# Patient Record
Sex: Male | Born: 1965 | Race: Black or African American | Hispanic: No | Smoking: Current every day smoker
Health system: Southern US, Community
[De-identification: ages and names within clinical notes are randomized; demographics above are authoritative.]

## PROBLEM LIST (undated history)

## (undated) DIAGNOSIS — R519 Headache, unspecified: Secondary | ICD-10-CM

## (undated) DIAGNOSIS — F419 Anxiety disorder, unspecified: Secondary | ICD-10-CM

## (undated) DIAGNOSIS — F1410 Cocaine abuse, uncomplicated: Secondary | ICD-10-CM

## (undated) DIAGNOSIS — F329 Major depressive disorder, single episode, unspecified: Secondary | ICD-10-CM

## (undated) DIAGNOSIS — Z973 Presence of spectacles and contact lenses: Secondary | ICD-10-CM

## (undated) DIAGNOSIS — G8929 Other chronic pain: Secondary | ICD-10-CM

## (undated) DIAGNOSIS — F1011 Alcohol abuse, in remission: Secondary | ICD-10-CM

## (undated) DIAGNOSIS — M1990 Unspecified osteoarthritis, unspecified site: Secondary | ICD-10-CM

## (undated) DIAGNOSIS — J45909 Unspecified asthma, uncomplicated: Secondary | ICD-10-CM

## (undated) DIAGNOSIS — F32A Depression, unspecified: Secondary | ICD-10-CM

## (undated) DIAGNOSIS — R45850 Homicidal ideations: Secondary | ICD-10-CM

## (undated) DIAGNOSIS — K7460 Unspecified cirrhosis of liver: Secondary | ICD-10-CM

## (undated) DIAGNOSIS — F259 Schizoaffective disorder, unspecified: Secondary | ICD-10-CM

## (undated) DIAGNOSIS — Z9989 Dependence on other enabling machines and devices: Secondary | ICD-10-CM

## (undated) DIAGNOSIS — R45851 Suicidal ideations: Secondary | ICD-10-CM

## (undated) DIAGNOSIS — F1994 Other psychoactive substance use, unspecified with psychoactive substance-induced mood disorder: Secondary | ICD-10-CM

## (undated) DIAGNOSIS — I10 Essential (primary) hypertension: Secondary | ICD-10-CM

## (undated) DIAGNOSIS — F319 Bipolar disorder, unspecified: Secondary | ICD-10-CM

## (undated) HISTORY — PX: COLONOSCOPY: SHX174

## (undated) HISTORY — PX: APPENDECTOMY: SHX54

---

## 1999-08-03 ENCOUNTER — Emergency Department (HOSPITAL_COMMUNITY): Admission: EM | Admit: 1999-08-03 | Discharge: 1999-08-03 | Payer: Self-pay | Admitting: Emergency Medicine

## 1999-09-25 ENCOUNTER — Emergency Department (HOSPITAL_COMMUNITY): Admission: EM | Admit: 1999-09-25 | Discharge: 1999-09-25 | Payer: Self-pay | Admitting: *Deleted

## 1999-11-04 ENCOUNTER — Emergency Department (HOSPITAL_COMMUNITY): Admission: EM | Admit: 1999-11-04 | Discharge: 1999-11-04 | Payer: Self-pay | Admitting: Emergency Medicine

## 2000-02-21 ENCOUNTER — Emergency Department (HOSPITAL_COMMUNITY): Admission: EM | Admit: 2000-02-21 | Discharge: 2000-02-21 | Payer: Self-pay | Admitting: Emergency Medicine

## 2000-06-16 ENCOUNTER — Emergency Department (HOSPITAL_COMMUNITY): Admission: EM | Admit: 2000-06-16 | Discharge: 2000-06-16 | Payer: Self-pay | Admitting: Emergency Medicine

## 2000-08-04 ENCOUNTER — Emergency Department (HOSPITAL_COMMUNITY): Admission: EM | Admit: 2000-08-04 | Discharge: 2000-08-04 | Payer: Self-pay

## 2003-12-11 ENCOUNTER — Inpatient Hospital Stay (HOSPITAL_COMMUNITY): Admission: EM | Admit: 2003-12-11 | Discharge: 2003-12-13 | Payer: Self-pay | Admitting: Emergency Medicine

## 2014-01-14 ENCOUNTER — Emergency Department (HOSPITAL_COMMUNITY): Payer: Self-pay

## 2014-01-14 ENCOUNTER — Encounter (HOSPITAL_COMMUNITY): Payer: Self-pay | Admitting: Emergency Medicine

## 2014-01-14 ENCOUNTER — Emergency Department (HOSPITAL_COMMUNITY)
Admission: EM | Admit: 2014-01-14 | Discharge: 2014-01-14 | Disposition: A | Discharge status: Home / Self Care | Payer: Self-pay | Attending: Emergency Medicine | Admitting: Emergency Medicine

## 2014-01-14 DIAGNOSIS — I10 Essential (primary) hypertension: Secondary | ICD-10-CM | POA: Insufficient documentation

## 2014-01-14 DIAGNOSIS — R519 Headache, unspecified: Secondary | ICD-10-CM

## 2014-01-14 DIAGNOSIS — Z8719 Personal history of other diseases of the digestive system: Secondary | ICD-10-CM | POA: Insufficient documentation

## 2014-01-14 DIAGNOSIS — R079 Chest pain, unspecified: Secondary | ICD-10-CM | POA: Insufficient documentation

## 2014-01-14 DIAGNOSIS — R51 Headache: Secondary | ICD-10-CM | POA: Insufficient documentation

## 2014-01-14 HISTORY — DX: Unspecified cirrhosis of liver: K74.60

## 2014-01-14 HISTORY — DX: Essential (primary) hypertension: I10

## 2014-01-14 LAB — COMPREHENSIVE METABOLIC PANEL
ALT: 26 U/L (ref 0–53)
AST: 22 U/L (ref 0–37)
Albumin: 3.7 g/dL (ref 3.5–5.2)
Alkaline Phosphatase: 60 U/L (ref 39–117)
BUN: 12 mg/dL (ref 6–23)
CHLORIDE: 105 meq/L (ref 96–112)
CO2: 24 meq/L (ref 19–32)
CREATININE: 1.2 mg/dL (ref 0.50–1.35)
Calcium: 8.5 mg/dL (ref 8.4–10.5)
GFR calc Af Amer: 80 mL/min — ABNORMAL LOW (ref >90)
GFR calc non Af Amer: 69 mL/min — ABNORMAL LOW (ref >90)
Glucose, Bld: 80 mg/dL (ref 70–99)
POTASSIUM: 3.8 meq/L (ref 3.7–5.3)
SODIUM: 144 meq/L (ref 137–147)
TOTAL PROTEIN: 6.6 g/dL (ref 6.0–8.3)
Total Bilirubin: 0.7 mg/dL (ref 0.3–1.2)

## 2014-01-14 LAB — CBC
HEMATOCRIT: 44.2 % (ref 39.0–52.0)
HEMOGLOBIN: 15.8 g/dL (ref 13.0–17.0)
MCH: 31.8 pg (ref 26.0–34.0)
MCHC: 35.7 g/dL (ref 30.0–36.0)
MCV: 88.9 fL (ref 78.0–100.0)
Platelets: 220 10*3/uL (ref 150–400)
RBC: 4.97 MIL/uL (ref 4.22–5.81)
RDW: 14.1 % (ref 11.5–15.5)
WBC: 7.2 10*3/uL (ref 4.0–10.5)

## 2014-01-14 LAB — PROTIME-INR
INR: 1 (ref 0.00–1.49)
Prothrombin Time: 13 seconds (ref 11.6–15.2)

## 2014-01-14 LAB — I-STAT TROPONIN, ED
TROPONIN I, POC: 0 ng/mL (ref 0.00–0.08)
TROPONIN I, POC: 0 ng/mL (ref 0.00–0.08)

## 2014-01-14 LAB — APTT: aPTT: 35 seconds (ref 24–37)

## 2014-01-14 MED ORDER — ASPIRIN 81 MG PO CHEW: 324.0000 mg | CHEWABLE_TABLET | Freq: Once | ORAL | Status: DC

## 2014-01-14 MED ORDER — MORPHINE SULFATE 4 MG/ML IJ SOLN
4.0000 mg | Freq: Once | INTRAMUSCULAR | Status: AC
  Administered 2014-01-14: 4 mg via INTRAVENOUS
  Filled 2014-01-14: qty 1

## 2014-01-14 MED ORDER — SODIUM CHLORIDE 0.9 % IV SOLN
1000.0000 mL | INTRAVENOUS | Status: DC
  Administered 2014-01-14: 1000 mL via INTRAVENOUS

## 2014-01-14 MED ORDER — NITROGLYCERIN 0.4 MG SL SUBL: 0.4000 mg | SUBLINGUAL_TABLET | SUBLINGUAL | Status: DC | PRN

## 2014-01-14 MED ORDER — NITROGLYCERIN 2 % TD OINT
1.0000 [in_us] | TOPICAL_OINTMENT | Freq: Once | TRANSDERMAL | Status: AC
  Administered 2014-01-14: 1 [in_us] via TOPICAL
  Filled 2014-01-14: qty 1

## 2014-01-14 NOTE — ED Notes (Signed)
Pt discharged home with all belongings, pt alert, oriented, and ambulatory upon discharge, no new RX pt verbalizes understanding of discharge instructions, cab voucher given

## 2014-01-14 NOTE — Discharge Instructions (Signed)
Aspirin and Your Heart Aspirin affects the way your blood clots and helps "thin" the blood. Aspirin has many uses in heart disease. It may be used as a primary prevention to help reduce the risk of heart related events. It also can be used as a secondary measure to prevent more heart attacks or to prevent additional damage from blood clots.  ASPIRIN MAY HELP IF YOU:  Have had a heart attack or chest pain.  Have undergone open heart surgery such as CABG (Coronary Artery Bypass Surgery).  Have had coronary angioplasty with or without stents.  Have experienced a stroke or TIA (transient ischemic attack).  Have peripheral vascular disease (PAD).  Have chronic heart rhythm problems such as atrial fibrillation.  Are at risk for heart disease. BEFORE STARTING ASPIRIN Before you start taking aspirin, your caregiver will need to review your medical history. Many things will need to be taken into consideration, such as:  Smoking status.  Blood pressure.  Diabetes.  Gender.  Weight.  Cholesterol level. ASPIRIN DOSES  Aspirin should only be taken on the advice of your caregiver. Talk to your caregiver about how much aspirin you should take. Aspirin comes in different doses such as:  81 mg.  162 mg.  325 mg.  The aspirin dose you take may be affected by many factors, some of which include:  Your current medications, especially if your are taking blood-thinners or anti-platelet medicine.  Liver function.  Heart disease risk.  Age.  Aspirin comes in two forms:  Non-enteric-coated. This type of aspirin does not have a coating and is absorbed faster. Non-enteric coated aspirin is recommended for patients experiencing chest pain symptoms. This type of aspirin also comes in a chewable form.  Enteric-coated. This means the aspirin has a special coating that releases the medicine very slowly. Enteric-coated aspirin causes less stomach upset. This type of aspirin should not be chewed  or crushed. ASPIRIN SIDE EFFECTS Daily use of aspirin can increase your risk of serious side effects. Some of these include:  Increased bleeding. This can range from a cut that does not stop bleeding to more serious problems such as stomach bleeding or bleeding into the brain (Intracerebral bleeding).  Increased bruising.  Stomach upset.  An allergic reaction such as red, itchy skin.  Increased risk of bleeding when combined with non-steroidal anti-inflammatory medicine (NSAIDS).  Alcohol should be drank in moderation when taking aspirin. Alcohol can increase the risk of stomach bleeding when taken with aspirin.  Aspirin should not be given to children less than 29 years of age due to the association of Reye syndrome. Reye syndrome is a serious illness that can affect the brain and liver. Studies have linked Reye syndrome with aspirin use in children.  People that have nasal polyps have an increased risk of developing an aspirin allergy. SEEK MEDICAL CARE IF:   You develop an allergic reaction such as:  Hives.  Itchy skin.  Swelling of the lips, tongue or face.  You develop stomach pain.  You have unusual bleeding or bruising.  You have ringing in your ears. SEEK IMMEDIATE MEDICAL CARE IF:   You have severe chest pain, especially if the pain is crushing or pressure-like and spreads to the arms, back, neck, or jaw. THIS IS AN EMERGENCY. Do not wait to see if the pain will go away. Get medical help at once. Call your local emergency services (911 in the [place] DO NOT drive yourself to the hospital.  You have stroke-like symptoms  such as:  Loss of vision.  Difficulty talking.  Numbness or weakness on one side of your body.  Numbness or weakness in your arm or leg.  Not thinking clearly or feeling confused.  Your bowel movements are bloody, dark red or black in color.  You vomit or cough up blood.  You have blood in your urine.  You have shortness of breath,  coughing or wheezing. MAKE SURE YOU:   Understand these instructions.  Will monitor your condition.  Seek immediate medical care if necessary. Document Released: 08/14/2008 Document Revised: 12/27/2012 Document Reviewed: 08/14/2008 [facility] Patient Information 2014 MatthewsExitCare, MarylandLLC.  Headaches, Frequently Asked Questions MIGRAINE HEADACHES Q: What is migraine? What causes it? How can I treat it? A: Generally, migraine headaches begin as a dull ache. Then they develop into a constant, throbbing, and pulsating pain. You may experience pain at the temples. You may experience pain at the front or back of one or both sides of the head. The pain is usually accompanied by a combination of:  Nausea.  Vomiting.  Sensitivity to light and noise. Some people (about 15%) experience an aura (see below) before an attack. The cause of migraine is believed to be chemical reactions in the brain. Treatment for migraine may include over-the-counter or prescription medications. It may also include self-help techniques. These include relaxation training and biofeedback.  Q: What is an aura? A: About 15% of people with migraine get an "aura". This is a sign of neurological symptoms that occur before a migraine headache. You may see wavy or jagged lines, dots, or flashing lights. You might experience tunnel vision or blind spots in one or both eyes. The aura can include visual or auditory hallucinations (something imagined). It may include disruptions in smell (such as strange odors), taste or touch. Other symptoms include:  Numbness.  A "pins and needles" sensation.  Difficulty in recalling or speaking the correct word. These neurological events may last as long as 60 minutes. These symptoms will fade as the headache begins. Q: What is a trigger? A: Certain physical or environmental factors can lead to or "trigger" a migraine. These include:  Foods.  Hormonal changes.  Weather.  Stress. It is  important to remember that triggers are different for everyone. To help prevent migraine attacks, you need to figure out which triggers affect you. Keep a headache diary. This is a good way to track triggers. The diary will help you talk to your healthcare professional about your condition. Q: Does weather affect migraines? A: Bright sunshine, hot, humid conditions, and drastic changes in barometric pressure may lead to, or "trigger," a migraine attack in some people. But studies have shown that weather does not act as a trigger for everyone with migraines. Q: What is the link between migraine and hormones? A: Hormones start and regulate many of your body's functions. Hormones keep your body in balance within a constantly changing environment. The levels of hormones in your body are unbalanced at times. Examples are during menstruation, pregnancy, or menopause. That can lead to a migraine attack. In fact, about three quarters of all women with migraine report that their attacks are related to the menstrual cycle.  Q: Is there an increased risk of stroke for migraine sufferers? A: The likelihood of a migraine attack causing a stroke is very remote. That is not to say that migraine sufferers cannot have a stroke associated with their migraines. In persons under age 50, the most common associated factor for stroke is  migraine headache. But over the course of a person's normal life span, the occurrence of migraine headache may actually be associated with a reduced risk of dying from cerebrovascular disease due to stroke.  Q: What are acute medications for migraine? A: Acute medications are used to treat the pain of the headache after it has started. Examples over-the-counter medications, NSAIDs, ergots, and triptans.  Q: What are the triptans? A: Triptans are the newest class of abortive medications. They are specifically targeted to treat migraine. Triptans are vasoconstrictors. They moderate some chemical  reactions in the brain. The triptans work on receptors in your brain. Triptans help to restore the balance of a neurotransmitter called serotonin. Fluctuations in levels of serotonin are thought to be a main cause of migraine.  Q: Are over-the-counter medications for migraine effective? A: Over-the-counter, or "OTC," medications may be effective in relieving mild to moderate pain and associated symptoms of migraine. But you should see your caregiver before beginning any treatment regimen for migraine.  Q: What are preventive medications for migraine? A: Preventive medications for migraine are sometimes referred to as "prophylactic" treatments. They are used to reduce the frequency, severity, and length of migraine attacks. Examples of preventive medications include antiepileptic medications, antidepressants, beta-blockers, calcium channel blockers, and NSAIDs (nonsteroidal anti-inflammatory drugs). Q: Why are anticonvulsants used to treat migraine? A: During the past few years, there has been an increased interest in antiepileptic drugs for the prevention of migraine. They are sometimes referred to as "anticonvulsants". Both epilepsy and migraine may be caused by similar reactions in the brain.  Q: Why are antidepressants used to treat migraine? A: Antidepressants are typically used to treat people with depression. They may reduce migraine frequency by regulating chemical levels, such as serotonin, in the brain.  Q: What alternative therapies are used to treat migraine? A: The term "alternative therapies" is often used to describe treatments considered outside the scope of conventional Western medicine. Examples of alternative therapy include acupuncture, acupressure, and yoga. Another common alternative treatment is herbal therapy. Some herbs are believed to relieve headache pain. Always discuss alternative therapies with your caregiver before proceeding. Some herbal products contain arsenic and other  toxins. TENSION HEADACHES Q: What is a tension-type headache? What causes it? How can I treat it? A: Tension-type headaches occur randomly. They are often the result of temporary stress, anxiety, fatigue, or anger. Symptoms include soreness in your temples, a tightening band-like sensation around your head (a "vice-like" ache). Symptoms can also include a pulling feeling, pressure sensations, and contracting head and neck muscles. The headache begins in your forehead, temples, or the back of your head and neck. Treatment for tension-type headache may include over-the-counter or prescription medications. Treatment may also include self-help techniques such as relaxation training and biofeedback. CLUSTER HEADACHES Q: What is a cluster headache? What causes it? How can I treat it? A: Cluster headache gets its name because the attacks come in groups. The pain arrives with little, if any, warning. It is usually on one side of the head. A tearing or bloodshot eye and a runny nose on the same side of the headache may also accompany the pain. Cluster headaches are believed to be caused by chemical reactions in the brain. They have been described as the most severe and intense of any headache type. Treatment for cluster headache includes prescription medication and oxygen. SINUS HEADACHES Q: What is a sinus headache? What causes it? How can I treat it? A: When a cavity in the bones  of the face and skull (a sinus) becomes inflamed, the inflammation will cause localized pain. This condition is usually the result of an allergic reaction, a tumor, or an infection. If your headache is caused by a sinus blockage, such as an infection, you will probably have a fever. An x-ray will confirm a sinus blockage. Your caregiver's treatment might include antibiotics for the infection, as well as antihistamines or decongestants.  REBOUND HEADACHES Q: What is a rebound headache? What causes it? How can I treat it? A: A pattern of  taking acute headache medications too often can lead to a condition known as "rebound headache." A pattern of taking too much headache medication includes taking it more than 2 days per week or in excessive amounts. That means more than the label or a caregiver advises. With rebound headaches, your medications not only stop relieving pain, they actually begin to cause headaches. Doctors treat rebound headache by tapering the medication that is being overused. Sometimes your caregiver will gradually substitute a different type of treatment or medication. Stopping may be a challenge. Regularly overusing a medication increases the potential for serious side effects. Consult a caregiver if you regularly use headache medications more than 2 days per week or more than the label advises. ADDITIONAL QUESTIONS AND ANSWERS Q: What is biofeedback? A: Biofeedback is a self-help treatment. Biofeedback uses special equipment to monitor your body's involuntary physical responses. Biofeedback monitors:  Breathing.  Pulse.  Heart rate.  Temperature.  Muscle tension.  Brain activity. Biofeedback helps you refine and perfect your relaxation exercises. You learn to control the physical responses that are related to stress. Once the technique has been mastered, you do not need the equipment any more. Q: Are headaches hereditary? A: Four out of five (80%) of people that suffer report a family history of migraine. Scientists are not sure if this is genetic or a family predisposition. Despite the uncertainty, a child has a 50% chance of having migraine if one parent suffers. The child has a 75% chance if both parents suffer.  Q: Can children get headaches? A: By the time they reach high school, most young people have experienced some type of headache. Many safe and effective approaches or medications can prevent a headache from occurring or stop it after it has begun.  Q: What type of doctor should I see to diagnose  and treat my headache? A: Start with your primary caregiver. Discuss his or her experience and approach to headaches. Discuss methods of classification, diagnosis, and treatment. Your caregiver may decide to recommend you to a headache specialist, depending upon your symptoms or other physical conditions. Having diabetes, allergies, etc., may require a more comprehensive and inclusive approach to your headache. The National Headache Foundation will provide, upon request, a list of NHF physician members in your state. Document Released: 11/22/2003 [facility] Revised: 11/24/2011 Document Reviewed: 05/01/2008 [facility] LPExitCare Patient Information 2014 [place]thornExitCare, MarylandLLC.

## 2014-01-14 NOTE — ED Notes (Signed)
Per EMS- [place]. Pt began having headache approx 1.5 hrs ago. Pt then began having chest pain. 4/10 pain in chest. Pt received 2nitro and 324mg  of asprin. 2/10 chest pain after nitro.

## 2014-01-14 NOTE — ED Provider Notes (Signed)
CSN: [id number]     Arrival date & time 01/14/14  1626 History   First MD Initiated Contact with Patient 01/14/14 1628     Chief Complaint  Patient presents with  . Headache  . Chest Pain   HPI Patient presents to the emergency room for evaluation of chest pain and headache. [place] About 1.5 hours ago he started developing a headache.  The headache is in the frontal region. It is sharp and throbbing. It is moderate to severe. Patient has had headaches like this in the past but this one has more severe. He has not had any nausea or vomiting. He's not had any trouble with any neck pain. He denies any confusion or loss of consciousness. He denies any fevers or chills.  Patient also started developing chest pain in the same time. It is described as 4/10. He is given aspirin and nitroglycerin his pain is improved. Patient does not have a history of heart disease. He states he's had pain off-and-on in his chest for years. He does not have history of blood clots. He has been having a cough. He denies any fevers or chills.  Patient does have history of cirrhosis, hypertension and ulcers. Past Medical History  Diagnosis Date  . Hypertension   . Liver cirrhosis    History reviewed. No pertinent past surgical history. History reviewed. No pertinent family history. History  Substance Use Topics  . Smoking status: Unknown If Ever Smoked  . Smokeless tobacco: Not on file  . Alcohol Use: Not on file    Review of Systems  All other systems reviewed and are negative.     Allergies  Review of patient's allergies indicates no known allergies.  Home Medications   Prior to Admission medications   Not on File  Patient is on several medications but he cannot recall their names and does not have them with him BP 137/86  Pulse 66  Temp(Src) 98.2 F (36.8 C) (Oral)  Resp 15  SpO2 93% Physical Exam  Nursing note and vitals reviewed. Constitutional: He appears  well-developed and well-nourished. No distress.  HENT:  Head: Normocephalic and atraumatic.  Right Ear: External ear normal.  Left Ear: External ear normal.  Eyes: Conjunctivae are normal. Right eye exhibits no discharge. Left eye exhibits no discharge. No scleral icterus.  Neck: Normal range of motion. Neck supple. No tracheal deviation present.  No meningeal signs  Cardiovascular: Normal rate, regular rhythm and intact distal pulses.   Pulmonary/Chest: Effort normal and breath sounds normal. No stridor. No respiratory distress. He has no wheezes. He has no rales.  Abdominal: Soft. Bowel sounds are normal. He exhibits no distension. There is no tenderness. There is no rebound and no guarding.  Musculoskeletal: He exhibits no edema and no tenderness.  Neurological: He is alert. He has normal strength. No cranial nerve deficit (no facial droop, extraocular movements intact, no slurred speech) or sensory deficit. He exhibits normal muscle tone. He displays no seizure activity. Coordination normal.  Skin: Skin is warm and dry. No rash noted.  Psychiatric: He has a normal mood and affect.    ED Course  Procedures (including critical care time) Labs Review Labs Reviewed  COMPREHENSIVE METABOLIC PANEL - Abnormal; Notable for the following:    GFR calc non Af Amer 69 (*)    GFR calc Af Amer 80 (*)    All other components within normal limits  APTT  CBC  PROTIME-INR  I-STAT TROPOININ, ED  Rosezena SensorI-STAT TROPOININ, ED    Imaging Review Dg Chest 2 View  01/14/2014   CLINICAL DATA:  Headache.  Chest pain.  EXAM: CHEST  2 VIEW  COMPARISON:  CT ABD-PELV W/ CM dated 12/01/2013; DG CHEST 1V dated 09/30/2013  FINDINGS: The heart size and mediastinal contours are within normal limits. Both lungs are clear. The visualized skeletal structures are unremarkable.  IMPRESSION: No active cardiopulmonary disease.   Electronically Signed   By: Maisie Fushomas  Register   On: 01/14/2014 17:57   Ct Head Wo Contrast  01/14/2014    CLINICAL DATA:  Headache.  EXAM: CT HEAD WITHOUT CONTRAST  TECHNIQUE: Contiguous axial images were obtained from the base of the skull through the vertex without intravenous contrast.  COMPARISON:  None.  FINDINGS: No mass. No hydrocephalus. No hemorrhage. Mucous retention cyst both maxillary sinuses. No acute bony abnormality.  IMPRESSION: No acute abnormality.   Electronically Signed   By: Maisie Fushomas  Register   On: 01/14/2014 17:16     EKG Interpretation   Date/Time:  Saturday Jan 14 2014 16:51:59 EDT Ventricular Rate:  58 PR Interval:  158 QRS Duration: 90 QT Interval:  451 QTC Calculation: 443 R Axis:   26 Text Interpretation:  Sinus rhythm Probable left atrial enlargement  Minimal ST elevation, anterior leads No significant change since last  tracing Confirmed by Ranell Finelli  MD-J, Kaylaann Mountz ([id number]) on 01/14/2014 5:01:52 PM      MDM   Final diagnoses:  Chest pain  Headache    Serial cardiac enzymes normal.  Pt is asymptomatic.  History atypical with the chest pain and headache.  Doubt ACS, Aortic dissection, PE or other acute abnormality.  At this time there does not appear to be any evidence of an acute emergency medical condition and the patient appears stable for discharge with appropriate outpatient follow up.     Celene KrasJon R Brandis Wixted, MD 01/14/14 2155

## 2014-01-14 NOTE — ED Notes (Signed)
Nitro patch removed and area cleaned with soap and water

## 2014-02-06 ENCOUNTER — Emergency Department (HOSPITAL_BASED_OUTPATIENT_CLINIC_OR_DEPARTMENT_OTHER)
Admission: EM | Admit: 2014-02-06 | Discharge: 2014-02-06 | Disposition: A | Discharge status: Home / Self Care | Payer: Self-pay | Attending: Emergency Medicine | Admitting: Emergency Medicine

## 2014-02-06 ENCOUNTER — Encounter (HOSPITAL_BASED_OUTPATIENT_CLINIC_OR_DEPARTMENT_OTHER): Payer: Self-pay | Admitting: Emergency Medicine

## 2014-02-06 ENCOUNTER — Emergency Department (HOSPITAL_BASED_OUTPATIENT_CLINIC_OR_DEPARTMENT_OTHER): Payer: Self-pay

## 2014-02-06 DIAGNOSIS — IMO0002 Reserved for concepts with insufficient information to code with codable children: Secondary | ICD-10-CM | POA: Insufficient documentation

## 2014-02-06 DIAGNOSIS — Z8719 Personal history of other diseases of the digestive system: Secondary | ICD-10-CM | POA: Insufficient documentation

## 2014-02-06 DIAGNOSIS — Y9302 Activity, running: Secondary | ICD-10-CM | POA: Insufficient documentation

## 2014-02-06 DIAGNOSIS — F329 Major depressive disorder, single episode, unspecified: Secondary | ICD-10-CM | POA: Insufficient documentation

## 2014-02-06 DIAGNOSIS — S93609A Unspecified sprain of unspecified foot, initial encounter: Secondary | ICD-10-CM | POA: Insufficient documentation

## 2014-02-06 DIAGNOSIS — X58XXXA Exposure to other specified factors, initial encounter: Secondary | ICD-10-CM | POA: Insufficient documentation

## 2014-02-06 DIAGNOSIS — Y929 Unspecified place or not applicable: Secondary | ICD-10-CM | POA: Insufficient documentation

## 2014-02-06 DIAGNOSIS — F3289 Other specified depressive episodes: Secondary | ICD-10-CM | POA: Insufficient documentation

## 2014-02-06 DIAGNOSIS — Z79899 Other long term (current) drug therapy: Secondary | ICD-10-CM | POA: Insufficient documentation

## 2014-02-06 DIAGNOSIS — I10 Essential (primary) hypertension: Secondary | ICD-10-CM | POA: Insufficient documentation

## 2014-02-06 HISTORY — DX: Depression, unspecified: F32.A

## 2014-02-06 HISTORY — DX: Alcohol abuse, in remission: F10.11

## 2014-02-06 HISTORY — DX: Major depressive disorder, single episode, unspecified: F32.9

## 2014-02-06 MED ORDER — IBUPROFEN 800 MG PO TABS: 800.0000 mg | ORAL_TABLET | Freq: Three times a day (TID) | ORAL | Status: DC

## 2014-02-06 NOTE — ED Notes (Signed)
Transported to & from xray.

## 2014-02-06 NOTE — Discharge Instructions (Signed)
Foot Sprain The muscles and cord like structures which attach muscle to bone (tendons) that surround the feet are made up of units. A foot sprain can occur at the weakest spot in any of these units. This condition is most often caused by injury to or overuse of the foot, as from playing contact sports, or aggravating a previous injury, or from poor conditioning, or obesity. SYMPTOMS  Pain with movement of the foot.  Tenderness and swelling at the injury site.  Loss of strength is present in moderate or severe sprains. THE THREE GRADES OR SEVERITY OF FOOT SPRAIN ARE:  Mild (Grade I): Slightly pulled muscle without tearing of muscle or tendon fibers or loss of strength.  Moderate (Grade II): Tearing of fibers in a muscle, tendon, or at the attachment to bone, with small decrease in strength.  Severe (Grade III): Rupture of the muscle-tendon-bone attachment, with separation of fibers. Severe sprain requires surgical repair. Often repeating (chronic) sprains are caused by overuse. Sudden (acute) sprains are caused by direct injury or over-use. DIAGNOSIS  Diagnosis of this condition is usually by your own observation. If problems continue, a caregiver may be required for further evaluation and treatment. X-rays may be required to make sure there are not breaks in the bones (fractures) present. Continued problems may require physical therapy for treatment. PREVENTION  Use strength and conditioning exercises appropriate for your sport.  Warm up properly prior to working out.  Use athletic shoes that are made for the sport you are participating in.  Allow adequate time for healing. Early return to activities makes repeat injury more likely, and can lead to an unstable arthritic foot that can result in prolonged disability. Mild sprains generally heal in 3 to 10 days, with moderate and severe sprains taking 2 to 10 weeks. Your caregiver can help you determine the proper time required for  healing. HOME CARE INSTRUCTIONS   Apply ice to the injury for 15-20 minutes, 03-04 times per day. Put the ice in a plastic bag and place a towel between the bag of ice and your skin.  An elastic wrap (like an Ace bandage) may be used to keep swelling down.  Keep foot above the level of the heart, or at least raised on a footstool, when swelling and pain are present.  Try to avoid use other than gentle range of motion while the foot is painful. Do not resume use until instructed by your caregiver. Then begin use gradually, not increasing use to the point of pain. If pain does develop, decrease use and continue the above measures, gradually increasing activities that do not cause discomfort, until you gradually achieve normal use.  Use crutches if and as instructed, and for the length of time instructed.  Keep injured foot and ankle wrapped between treatments.  Massage foot and ankle for comfort and to keep swelling down. Massage from the toes up towards the knee.  Only take over-the-counter or prescription medicines for pain, discomfort, or fever as directed by your caregiver. SEEK IMMEDIATE MEDICAL CARE IF:   Your pain and swelling increase, or pain is not controlled with medications.  You have loss of feeling in your foot or your foot turns cold or blue.  You develop new, unexplained symptoms, or an increase of the symptoms that brought you to your caregiver. MAKE SURE YOU:   Understand these instructions.  Will watch your condition.  Will get help right away if you are not doing well or get worse. Document Released:   02/21/2002 Document Revised: 11/24/2011 Document Reviewed: 04/20/2008 Exit[employer] Patient Information 2014 [employer].  

## 2014-02-06 NOTE — ED Provider Notes (Signed)
CSN: [id number]     Arrival date & time 02/06/14  0920 History   First MD Initiated Contact with Patient 02/06/14 [phone]     Chief Complaint  Patient presents with  . Foot Pain     (Consider location/radiation/quality/duration/timing/severity/associated sxs/prior Treatment) Patient is a 48 y.o. male presenting with lower extremity pain. The history is provided by the patient. No language interpreter was used.  Foot Pain This is a new problem. The current episode started today. The problem occurs constantly. The problem has been gradually worsening. Associated symptoms include myalgias. Pertinent negatives include no joint swelling. Nothing aggravates the symptoms.    Past Medical History  Diagnosis Date  . Hypertension   . Liver cirrhosis   . Depression   . History of ETOH abuse    No past surgical history on file. No family history on file. History  Substance Use Topics  . Smoking status: Unknown If Ever Smoked  . Smokeless tobacco: Not on file  . Alcohol Use: Not on file    Review of Systems  Musculoskeletal: Positive for myalgias. Negative for joint swelling.  Skin: Negative for wound.  All other systems reviewed and are negative.     Allergies  Review of patient's allergies indicates no known allergies.  Home Medications   Prior to Admission medications   Medication Sig Start Date End Date Taking? Authorizing Provider  citalopram (CELEXA) 40 MG tablet Take 40 mg by mouth daily.   Yes Historical Provider, MD  hydrocortisone cream 0.5 % Apply 1 application topically 4 (four) times daily.   Yes Historical Provider, MD  lisinopril (PRINIVIL,ZESTRIL) 20 MG tablet Take 20 mg by mouth daily.   Yes Historical Provider, MD  omeprazole (PRILOSEC) 40 MG capsule Take 80 mg by mouth daily.   Yes Historical Provider, MD  thiothixene (NAVANE) 5 MG capsule Take 5 mg by mouth at bedtime.   Yes Historical Provider, MD  topiramate (TOPAMAX) 25 MG tablet Take 25 mg by mouth 3 (three)  times daily.   Yes Historical Provider, MD  traZODone (DESYREL) 100 MG tablet Take 100 mg by mouth at bedtime as needed for sleep.   Yes Historical Provider, MD   BP 200/102  Pulse 77  Temp(Src) 98.7 F (37.1 C) (Oral)  Resp 18  Ht 6' 1.5" (1.867 m)  Wt 240 lb (108.863 kg)  BMI 31.23 kg/m2  SpO2 100% Physical Exam  Constitutional: He appears well-developed and well-nourished.  Eyes: Pupils are equal, round, and reactive to light.  Neck: Normal range of motion. Neck supple.  Cardiovascular: Normal rate.   Pulmonary/Chest: Effort normal.  Abdominal: Soft.  Musculoskeletal: He exhibits tenderness.  Neurological: He is alert.  Skin: Skin is warm.    ED Course  Procedures (including critical care time) Labs Review Labs Reviewed - No data to display  Imaging Review Dg Foot Complete Right  02/06/2014   CLINICAL DATA:  Fifth toe pain after injury.  EXAM: RIGHT FOOT COMPLETE - 3+ VIEW  COMPARISON:  None.  FINDINGS: There is no evidence of fracture or dislocation. There is no evidence of arthropathy or other focal bone abnormality. Soft tissues are unremarkable.  IMPRESSION: Normal right foot.   Electronically Signed   By: Roque Lias M.D.   On: 02/06/2014 09:54     EKG Interpretation None      MDM   Final diagnoses:  None    Pt advise probable sprain.   Pt placed in ace and shoe, Rxf or ibiprofen Follow up  with Dr. Jamey RipaHundall for receck    Elson AreasLeslie K Sofia, PA-C 02/06/14 1043

## 2014-02-06 NOTE — ED Notes (Signed)
Right foot pain since Friday.  Pt was running and believes he may have injured his foot.  Pulses palpable.  Pt having more pain with movement and weight bearing.

## 2014-02-08 NOTE — ED Provider Notes (Signed)
Medical screening examination/treatment/procedure(s) were performed by non-physician practitioner and as supervising physician I was immediately available for consultation/collaboration.   EKG Interpretation None        Rolland Porter, MD 02/08/14 1537

## 2014-07-14 ENCOUNTER — Emergency Department (HOSPITAL_COMMUNITY): Payer: Self-pay

## 2014-07-14 ENCOUNTER — Emergency Department (HOSPITAL_BASED_OUTPATIENT_CLINIC_OR_DEPARTMENT_OTHER)
Admission: EM | Admit: 2014-07-14 | Discharge: 2014-07-14 | Disposition: A | Discharge status: Home / Self Care | Payer: Self-pay | Attending: Emergency Medicine | Admitting: Emergency Medicine

## 2014-07-14 ENCOUNTER — Emergency Department (HOSPITAL_BASED_OUTPATIENT_CLINIC_OR_DEPARTMENT_OTHER): Payer: Self-pay

## 2014-07-14 ENCOUNTER — Encounter (HOSPITAL_BASED_OUTPATIENT_CLINIC_OR_DEPARTMENT_OTHER): Payer: Self-pay | Admitting: Emergency Medicine

## 2014-07-14 DIAGNOSIS — S20229A Contusion of unspecified back wall of thorax, initial encounter: Secondary | ICD-10-CM

## 2014-07-14 DIAGNOSIS — Y9389 Activity, other specified: Secondary | ICD-10-CM | POA: Insufficient documentation

## 2014-07-14 DIAGNOSIS — F329 Major depressive disorder, single episode, unspecified: Secondary | ICD-10-CM | POA: Insufficient documentation

## 2014-07-14 DIAGNOSIS — Z8719 Personal history of other diseases of the digestive system: Secondary | ICD-10-CM | POA: Insufficient documentation

## 2014-07-14 DIAGNOSIS — S199XXA Unspecified injury of neck, initial encounter: Secondary | ICD-10-CM | POA: Insufficient documentation

## 2014-07-14 DIAGNOSIS — W19XXXA Unspecified fall, initial encounter: Secondary | ICD-10-CM

## 2014-07-14 DIAGNOSIS — Z791 Long term (current) use of non-steroidal anti-inflammatories (NSAID): Secondary | ICD-10-CM | POA: Insufficient documentation

## 2014-07-14 DIAGNOSIS — W01198A Fall on same level from slipping, tripping and stumbling with subsequent striking against other object, initial encounter: Secondary | ICD-10-CM | POA: Insufficient documentation

## 2014-07-14 DIAGNOSIS — Z7952 Long term (current) use of systemic steroids: Secondary | ICD-10-CM | POA: Insufficient documentation

## 2014-07-14 DIAGNOSIS — Z79899 Other long term (current) drug therapy: Secondary | ICD-10-CM | POA: Insufficient documentation

## 2014-07-14 DIAGNOSIS — Z7982 Long term (current) use of aspirin: Secondary | ICD-10-CM | POA: Insufficient documentation

## 2014-07-14 DIAGNOSIS — I10 Essential (primary) hypertension: Secondary | ICD-10-CM | POA: Insufficient documentation

## 2014-07-14 DIAGNOSIS — Y9289 Other specified places as the place of occurrence of the external cause: Secondary | ICD-10-CM | POA: Insufficient documentation

## 2014-07-14 DIAGNOSIS — M5442 Lumbago with sciatica, left side: Secondary | ICD-10-CM

## 2014-07-14 MED ORDER — KETOROLAC TROMETHAMINE 30 MG/ML IJ SOLN
30.0000 mg | Freq: Once | INTRAMUSCULAR | Status: AC
  Administered 2014-07-14: 30 mg via INTRAVENOUS
  Filled 2014-07-14: qty 1

## 2014-07-14 MED ORDER — IBUPROFEN 800 MG PO TABS: 800.0000 mg | ORAL_TABLET | Freq: Three times a day (TID) | ORAL | Status: DC

## 2014-07-14 MED ORDER — METHOCARBAMOL 1000 MG/10ML IJ SOLN: 1000.0000 mg | Freq: Once | INTRAVENOUS | Status: DC

## 2014-07-14 MED ORDER — ONDANSETRON HCL 4 MG/2ML IJ SOLN: 4.0000 mg | Freq: Once | INTRAMUSCULAR | Status: DC

## 2014-07-14 MED ORDER — MORPHINE SULFATE 2 MG/ML IJ SOLN: 2.0000 mg | INTRAMUSCULAR | Status: DC | PRN

## 2014-07-14 MED ORDER — CYCLOBENZAPRINE HCL 10 MG PO TABS: 10.0000 mg | ORAL_TABLET | Freq: Two times a day (BID) | ORAL | Status: DC | PRN

## 2014-07-14 MED ORDER — MORPHINE SULFATE 4 MG/ML IJ SOLN
4.0000 mg | INTRAMUSCULAR | Status: DC | PRN
  Administered 2014-07-14: 4 mg via INTRAVENOUS
  Filled 2014-07-14: qty 1

## 2014-07-14 MED ORDER — ONDANSETRON HCL 4 MG/2ML IJ SOLN
4.0000 mg | Freq: Once | INTRAMUSCULAR | Status: AC
  Administered 2014-07-14: 4 mg via INTRAVENOUS
  Filled 2014-07-14: qty 2

## 2014-07-14 MED ORDER — METHOCARBAMOL 1000 MG/10ML IJ SOLN
INTRAMUSCULAR | Status: AC
  Administered 2014-07-14: 1000 mg
  Filled 2014-07-14: qty 10

## 2014-07-14 NOTE — ED Provider Notes (Signed)
CSN: [id number]     Arrival date & time 07/14/14  1823 History  This chart was scribe for Rolland PorterMark Anikin Prosser, MD by Angelene GiovanniEmmanuella Mensah, ED Scribe. The patient was seen in room MH09/MH09 and the patient's care was started at 6:32 PM.    Chief Complaint  Patient presents with  . Back Pain   The history is provided by the patient. No language interpreter was used.   HPI Comments: Paul Blair is a 48 y.o. male who presents to the Emergency Department complaining of a constant back pain that radiates throughout his body after sustaining a fall PTA. He states that he slipped on some wet concrete, fell onto his head, and then his shoulders went flat on the concrete beneath him. He reports an associated sharp back and neck pain. He states that he was able to ambulate after the fall. He reports that he fell on his back. [facility] due to a back injury caused by a pipe then. He sustained a "pinched nerve" in the lower back.    Past Medical History  Diagnosis Date  . Hypertension   . Liver cirrhosis   . Depression   . History of ETOH abuse    History reviewed. No pertinent past surgical history. No family history on file. History  Substance Use Topics  . Smoking status: Unknown If Ever Smoked  . Smokeless tobacco: Not on file  . Alcohol Use: Not on file     Comment: hx abuse- incarcerated at present    Review of Systems  Constitutional: Negative for appetite change and fatigue.  HENT: Negative for congestion, ear discharge and sinus pressure.   Eyes: Negative for discharge.  Respiratory: Negative for cough.   Cardiovascular: Negative for chest pain.  Gastrointestinal: Negative for abdominal pain and diarrhea.  Genitourinary: Negative for frequency and hematuria.  Musculoskeletal: Negative for back pain.  Skin: Negative for rash.  Neurological: Negative for seizures and headaches.  Psychiatric/Behavioral: Negative for hallucinations.      Allergies   Review of patient's allergies indicates no known allergies.  Home Medications   Prior to Admission medications   Medication Sig Start Date End Date Taking? Authorizing Provider  amLODipine (NORVASC) 10 MG tablet Take 10 mg by mouth daily.   Yes Historical Provider, MD  aspirin 81 MG tablet Take 81 mg by mouth daily.   Yes Historical Provider, MD  carvedilol (COREG) 6.25 MG tablet Take 6.25 mg by mouth 2 (two) times daily with a meal.   Yes Historical Provider, MD  citalopram (CELEXA) 40 MG tablet Take 40 mg by mouth daily.    Historical Provider, MD  cyclobenzaprine (FLEXERIL) 10 MG tablet Take 1 tablet (10 mg total) by mouth 2 (two) times daily as needed for muscle spasms. 07/14/14   Rolland PorterMark Gurfateh Mcclain, MD  hydrocortisone cream 0.5 % Apply 1 application topically 4 (four) times daily.    Historical Provider, MD  ibuprofen (ADVIL,MOTRIN) 800 MG tablet Take 1 tablet (800 mg total) by mouth 3 (three) times daily. 02/06/14   Elson AreasLeslie K Sofia, PA-C  ibuprofen (ADVIL,MOTRIN) 800 MG tablet Take 1 tablet (800 mg total) by mouth 3 (three) times daily. 07/14/14   Rolland PorterMark Ellar Hakala, MD  lisinopril (PRINIVIL,ZESTRIL) 20 MG tablet Take 20 mg by mouth daily.    Historical Provider, MD  omeprazole (PRILOSEC) 40 MG capsule Take 80 mg by mouth daily.    Historical Provider, MD  thiothixene (NAVANE) 5 MG capsule Take 5 mg  by mouth at bedtime.    Historical Provider, MD  topiramate (TOPAMAX) 25 MG tablet Take 25 mg by mouth 3 (three) times daily.    Historical Provider, MD  traZODone (DESYREL) 100 MG tablet Take 100 mg by mouth at bedtime as needed for sleep.    Historical Provider, MD   BP 161/88  Pulse 64  Temp(Src) 97.7 F (36.5 C) (Oral)  Resp 18  Wt 240 lb (108.863 kg)  SpO2 98% Physical Exam  Nursing note and vitals reviewed. Constitutional: He is oriented to person, place, and time. He appears well-developed and well-nourished.     HENT:  Head: Normocephalic and atraumatic.  Eyes: Conjunctivae and EOM are  normal. Pupils are equal, round, and reactive to light.  Neck: Normal range of motion. Neck supple.  Cardiovascular: Normal rate, regular rhythm and normal heart sounds.   Pulmonary/Chest: Effort normal and breath sounds normal.  Abdominal: Soft. Bowel sounds are normal.  Musculoskeletal: Normal range of motion. He exhibits tenderness.  Tenderness from cervical lumbar spine perispinal  Normal strength bi laterally Normal strength in legs  Neurological: He is alert and oriented to person, place, and time.  No sensory or motor deficits. Normal symmetric Strength to shoulder shrug, triceps, biceps, grip,wrist flex/extend,and intrinsics  Norma lsymmetric sensation above and below clavicles, and to all distributions to UEs. Norma symmetric strength to flex/.extend hip and knees, dorsi/plantar flex ankles. He states that it is more painful in his back to flex his left leg and knee. Normal symmetric sensation to all distributions to LEs Patellar and achilles reflexes 1-2+. Downgoing Babinski   Skin: Skin is warm and dry.  Psychiatric: He has a normal mood and affect. His behavior is normal.    ED Course  Procedures (including critical care time) DIAGNOSTIC STUDIES: Oxygen Saturation is 98% on RA, normal by my interpretation.    COORDINATION OF CARE: 6:36 PM- Pt advised of plan for treatment and pt agrees.  Labs Review Labs Reviewed - No data to display  Imaging Review Dg Thoracic Spine 2 View  07/14/2014   CLINICAL DATA:  Slipped on water with fall to floor, now with back pain  EXAM: THORACIC SPINE - 2 VIEW  COMPARISON:  None.  FINDINGS: There is no evidence of thoracic spine fracture. Alignment is normal. No other significant bone abnormalities are identified.  IMPRESSION: No acute abnormality noted.   Electronically Signed   By: Alcide Clever M.D.   On: 07/14/2014 20:33   Dg Lumbar Spine Complete  07/14/2014   CLINICAL DATA:  Slipped on water and fell onto floor now with back pain   EXAM: LUMBAR SPINE - COMPLETE 4+ VIEW  COMPARISON:  None.  FINDINGS: There is no evidence of lumbar spine fracture. Alignment is normal. Intervertebral disc spaces are maintained.  IMPRESSION: No acute abnormality noted.   Electronically Signed   By: Alcide Clever M.D.   On: 07/14/2014 20:32   Ct Cervical Spine Wo Contrast  07/14/2014   CLINICAL DATA:  Slipped and fell on concrete floor. Pain. Initial evaluation.  EXAM: CT CERVICAL SPINE WITHOUT CONTRAST  TECHNIQUE: Multidetector CT imaging of the cervical spine was performed without intravenous contrast. Multiplanar CT image reconstructions were also generated.  COMPARISON:  Head CT 01/14/2014.  Cervical spine CT 08/30/2014.  FINDINGS: Multiple shotty cervical lymph nodes. Pulmonary apices are clear. No soft tissue swelling. Retropharyngeal space is normal. Diffuse degenerative change cervical spine. Loss of normal cervical lordosis, this is most likely degenerative. These could be positional  or from spasm. Ligamentous injury cannot be excluded.  IMPRESSION: 1. Diffuse degenerative change cervical spine.  2. Loss of normal cervical lordosis, this is most likely degenerative. This could be positional or from spasm. Ligamentous injury cannot be excluded.   Electronically Signed   By: Maisie Fushomas  Register   On: 07/14/2014 20:16     EKG Interpretation None      MDM   Final diagnoses:  Fall  Bilateral low back pain with left-sided sciatica  Back contusion, unspecified laterality, initial encounter   I was able to speak with the provider at the medical facility where Paul Blair is incarcerated. They were are able to provide medications to him. No narcotics are available for dispensing there. They can consider limited activity. Paul Blair claims little relief with the above medications. He has no sensory or motor deficits. He states when he moves his back this pain "shoots" when asked to demonstrate where he indicates into his left buttock. He has repeat  normal neurological exam. Think he is appropriate for discharge.   I personally performed the services described in this documentation, which was scribed in my presence. The recorded information has been reviewed and is accurate.    Rolland PorterMark Detroit Frieden, MD 07/14/14 2101

## 2014-07-14 NOTE — ED Notes (Signed)
Slipped on water and fell on concrete floor.  Reports severe neck and back pain with numbness and tingling in LE

## 2014-07-14 NOTE — Discharge Instructions (Signed)
Ice to painful areas. Medications as prescribed.  Back Pain, Adult Back pain is very common. The pain often gets better over time. The cause of back pain is usually not dangerous. Most people can learn to manage their back pain on their own.  HOME CARE   Stay active. Start with short walks on flat ground if you can. Try to walk farther each day.  Do not sit, drive, or stand in one place for more than 30 minutes. Do not stay in bed.  Do not avoid exercise or work. Activity can help your back heal faster.  Be careful when you bend or lift an object. Bend at your knees, keep the object close to you, and do not twist.  Sleep on a firm mattress. Lie on your side, and bend your knees. If you lie on your back, put a pillow under your knees.  Only take medicines as told by your doctor.  Put ice on the injured area.  Put ice in a plastic bag.  Place a towel between your skin and the bag.  Leave the ice on for 15-20 minutes, 03-04 times a day for the first 2 to 3 days. After that, you can switch between ice and heat packs.  Ask your doctor about back exercises or massage.  Avoid feeling anxious or stressed. Find good ways to deal with stress, such as exercise. GET HELP RIGHT AWAY IF:   Your pain does not go away with rest or medicine.  Your pain does not go away in 1 week.  You have new problems.  You do not feel well.  The pain spreads into your legs.  You cannot control when you poop (bowel movement) or pee (urinate).  Your arms or legs feel weak or lose feeling (numbness).  You feel sick to your stomach (nauseous) or throw up (vomit).  You have belly (abdominal) pain.  You feel like you may pass out (faint). MAKE SURE YOU:   Understand these instructions.  Will watch your condition.  Will get help right away if you are not doing well or get worse. Document Released: 02/18/2008 Document Revised: 11/24/2011 Document Reviewed: 01/03/2014 [facility] Patient Information  2015 [place]. This information is not intended to replace advice given to you by your health care provider. Make sure you discuss any questions you have with your health care provider.

## 2014-10-29 ENCOUNTER — Emergency Department (HOSPITAL_COMMUNITY)
Admission: EM | Admit: 2014-10-29 | Discharge: 2014-10-29 | Disposition: A | Discharge status: Home / Self Care | Payer: Self-pay | Attending: Emergency Medicine | Admitting: Emergency Medicine

## 2014-10-29 ENCOUNTER — Encounter (HOSPITAL_COMMUNITY): Payer: Self-pay | Admitting: Emergency Medicine

## 2014-10-29 DIAGNOSIS — I10 Essential (primary) hypertension: Secondary | ICD-10-CM | POA: Insufficient documentation

## 2014-10-29 DIAGNOSIS — Z8719 Personal history of other diseases of the digestive system: Secondary | ICD-10-CM | POA: Insufficient documentation

## 2014-10-29 DIAGNOSIS — Z791 Long term (current) use of non-steroidal anti-inflammatories (NSAID): Secondary | ICD-10-CM | POA: Insufficient documentation

## 2014-10-29 DIAGNOSIS — Z7982 Long term (current) use of aspirin: Secondary | ICD-10-CM | POA: Insufficient documentation

## 2014-10-29 DIAGNOSIS — F329 Major depressive disorder, single episode, unspecified: Secondary | ICD-10-CM | POA: Insufficient documentation

## 2014-10-29 DIAGNOSIS — Z720 Tobacco use: Secondary | ICD-10-CM | POA: Insufficient documentation

## 2014-10-29 DIAGNOSIS — Z7952 Long term (current) use of systemic steroids: Secondary | ICD-10-CM | POA: Insufficient documentation

## 2014-10-29 DIAGNOSIS — Z79899 Other long term (current) drug therapy: Secondary | ICD-10-CM | POA: Insufficient documentation

## 2014-10-29 DIAGNOSIS — F1010 Alcohol abuse, uncomplicated: Secondary | ICD-10-CM | POA: Insufficient documentation

## 2014-10-29 NOTE — ED Provider Notes (Signed)
CSN: [id number]     Arrival date & time 10/29/14  1519 History   First MD Initiated Contact with Patient 10/29/14 1616     Chief Complaint  Patient presents with  . etoh detox      (Consider location/radiation/quality/duration/timing/severity/associated sxs/prior Treatment) HPI Comments: Pt comes in with c/o wanting detox from etoh. States that he was recently released from jail and he feels out of place. Denies si/hi. States that he has no history seizure. Denies fever, vomiting. Abdominal pain.states that sometimes he smokes marijuana. He states that he is looking for help because he wants to be a productive person  The history is provided by the patient. No language interpreter was used.    Past Medical History  Diagnosis Date  . Hypertension   . Liver cirrhosis   . Depression   . History of ETOH abuse    History reviewed. No pertinent past surgical history. No family history on file. History  Substance Use Topics  . Smoking status: Current Every Day Smoker  . Smokeless tobacco: Not on file  . Alcohol Use: Yes     Comment: hx abuse- incarcerated at present    Review of Systems  All other systems reviewed and are negative.     Allergies  Review of patient's allergies indicates no known allergies.  Home Medications   Prior to Admission medications   Medication Sig Start Date End Date Taking? Authorizing Provider  amLODipine (NORVASC) 10 MG tablet Take 10 mg by mouth daily.    Historical Provider, MD  aspirin 81 MG tablet Take 81 mg by mouth daily.    Historical Provider, MD  carvedilol (COREG) 6.25 MG tablet Take 6.25 mg by mouth 2 (two) times daily with a meal.    Historical Provider, MD  citalopram (CELEXA) 40 MG tablet Take 40 mg by mouth daily.    Historical Provider, MD  cyclobenzaprine (FLEXERIL) 10 MG tablet Take 1 tablet (10 mg total) by mouth 2 (two) times daily as needed for muscle spasms. 07/14/14   Rolland PorterMark James, MD  hydrocortisone cream 0.5 % Apply 1  application topically 4 (four) times daily.    Historical Provider, MD  ibuprofen (ADVIL,MOTRIN) 800 MG tablet Take 1 tablet (800 mg total) by mouth 3 (three) times daily. 02/06/14   Elson AreasLeslie K Sofia, PA-C  ibuprofen (ADVIL,MOTRIN) 800 MG tablet Take 1 tablet (800 mg total) by mouth 3 (three) times daily. 07/14/14   Rolland PorterMark James, MD  lisinopril (PRINIVIL,ZESTRIL) 20 MG tablet Take 20 mg by mouth daily.    Historical Provider, MD  omeprazole (PRILOSEC) 40 MG capsule Take 80 mg by mouth daily.    Historical Provider, MD  thiothixene (NAVANE) 5 MG capsule Take 5 mg by mouth at bedtime.    Historical Provider, MD  topiramate (TOPAMAX) 25 MG tablet Take 25 mg by mouth 3 (three) times daily.    Historical Provider, MD  traZODone (DESYREL) 100 MG tablet Take 100 mg by mouth at bedtime as needed for sleep.    Historical Provider, MD   BP 185/105 mmHg  Pulse 75  Temp(Src) 98.5 F (36.9 C) (Oral)  SpO2 98% Physical Exam  Constitutional: He is oriented to person, place, and time. He appears well-developed and well-nourished.  Cardiovascular: Normal rate and regular rhythm.   Pulmonary/Chest: Effort normal.  Abdominal: Soft.  Musculoskeletal: Normal range of motion.  Neurological: He is alert and oriented to person, place, and time. He exhibits normal muscle tone. Coordination normal.  Skin: Skin is warm  and dry.  Psychiatric: He has a normal mood and affect. His speech is normal. Cognition and memory are normal.  Nursing note and vitals reviewed.   ED Course  Procedures (including critical care time) Labs Review Labs Reviewed - No data to display  Imaging Review No results found.   EKG Interpretation None      MDM   Final diagnoses:  ETOH abuse    Pt given resource guide. No si/hi. No history of seizures.    Teressa Lower, NP 10/29/14 1719  Toy Baker, MD 10/29/14 [phone]

## 2014-10-29 NOTE — ED Notes (Signed)
Per pt, states he needs help detoxing off of ETOH-last drink today-drinks daily-sober 9 months ago

## 2014-10-29 NOTE — Discharge Instructions (Signed)
Alcohol Intoxication °Alcohol intoxication occurs when you drink enough alcohol that it affects your ability to function. It can be mild or very severe. Drinking a lot of alcohol in a short time is called binge drinking. This can be very harmful. Drinking alcohol can also be more dangerous if you are taking medicines or other drugs. Some of the effects caused by alcohol may include: °· Loss of coordination. °· Changes in mood and behavior. °· Unclear thinking. °· Trouble talking (slurred speech). °· Throwing up (vomiting). °· Confusion. °· Slowed breathing. °· Twitching and shaking (seizures). °· Loss of consciousness. °HOME CARE °· Do not drive after drinking alcohol. °· Drink enough water and fluids to keep your pee (urine) clear or pale yellow. Avoid caffeine. °· Only take medicine as told by your doctor. °GET HELP IF: °· You throw up (vomit) many times. °· You do not feel better after a few days. °· You frequently have alcohol intoxication. Your doctor can help decide if you should see a substance use treatment counselor. °GET HELP RIGHT AWAY IF: °· You become shaky when you stop drinking. °· You have twitching and shaking. °· You throw up blood. It may look bright red or like coffee grounds. °· You notice blood in your poop (bowel movements). °· You become lightheaded or pass out (faint). °MAKE SURE YOU:  °· Understand these instructions. °· Will watch your condition. °· Will get help right away if you are not doing well or get worse. °Document Released: 02/18/2008 Document Revised: 05/04/2013 Document Reviewed: 02/04/2013 °ExitCare® Patient Information ©2015 [employer]. This information is not intended to replace advice given to you by your health care provider. Make sure you discuss any questions you have with your health care provider. ° ° °Emergency Department Resource Guide °1) Find a Doctor and Pay Out of Pocket °Although you won't have to find out who is covered by your insurance plan, it is a good  idea to ask around and get recommendations. You will then need to call the office and see if the doctor you have chosen will accept you as a new patient and what types of options they offer for patients who are self-pay. Some doctors offer discounts or will set up payment plans for their patients who do not have insurance, but you will need to ask so you aren't surprised when you get to your appointment. ° °2) Contact Your Local Health Department °Not all health departments have doctors that can see patients for sick visits, but many do, so it is worth a call to see if yours does. If you don't know where your local health department is, you can check in your phone book. The CDC also has a tool to help you locate your state's health department, and many state websites also have listings of all of their local health departments. ° °3) Find a Walk-in Clinic °If your illness is not likely to be very severe or complicated, you may want to try a walk in clinic. These are popping up all over the country in pharmacies, drugstores, and shopping centers. They're usually staffed by nurse practitioners or physician assistants that have been trained to treat common illnesses and complaints. They're usually fairly quick and inexpensive. However, if you have serious medical issues or chronic medical problems, these are probably not your best option. ° °No Primary Care Doctor: °- Call [facility] at  832-8000 - they can help you locate a primary care doctor that  accepts your insurance, provides   certain services, etc. °- Physician Referral Service- 1-800-533-3463 ° °Chronic Pain Problems: °Organization         Address  Phone   Notes  °[facility]  (336) 297-2271 Patients need to be referred by their primary care doctor.  ° °Medication Assistance: °Organization         Address  Phone   Notes  °[place] [facility] [address] °[place], [place] [address] (336) 641-8030  --Must be a resident of [place] °-- Must have NO insurance coverage whatsoever (no Medicaid/ Medicare, etc.) °-- The pt. MUST have a primary care doctor that directs their care regularly and follows them in the community °  °[facility]  (866) 331-1348   °[employer]  (888) 892-1162   ° °Agencies that provide inexpensive medical care: °Organization         Address  Phone   Notes  °[facility]  (336) 832-8035   °[facility]    (336) 832-7272   °[facility] [address] °[place], [place] [address] (336) 832-4777   °[facility] [address], °[place] (336) 271-4999   °[facility]    (336) 373-0678   °[facility]    (336) 272-1050   °[facility] ° [address], [place] Phone:  (336) 832-4444, Fax:  (336) 832-4440 Hours of Operation:  9 am - 6 pm, M-F.  Also accepts Medicaid/Medicare and self-pay.  °[facility] ° [address], [place] Phone: (336) 832-3150, Fax: (336) 832-3151. Hours of Operation:  8:30 am - 5:30 pm, M-F.  Also accepts Medicaid and self-pay.  °[facility] [place] [address], [place] Phone: (336) 878-6027   °[facility] [address], [place], [place] (336)723-1848, Ext. 123 Mondays & Thursdays: 7-9 AM.  First 15 patients are seen on a first come, first serve basis. °  ° °Medicaid-accepting [place] Providers: ° °Organization         Address  Phone   Notes  °[facility] [address], [place] (336) 641-2100 Also accepts self-pay patients.  °[facility] [address], [place] ° (336) 856-9996   °[facility] [address], [place] (336) 288-8857   °[facility] [address] [place] [address], [place] (336) 299-7000   °Veita Bland [address], [place]  ° (336) 373-1557 Only  accepts [place] Access Medicaid patients after they have their name applied to their card.  ° °Self-Pay (no insurance) in [place]: ° °Organization         Address  Phone   Notes  °Sickle Cell Patients, [facility] [address], [place] (336) 832-1970   °[facility] [address], [place] (336) 832-4400   °[facility] ° [address], [place] (336) 992-4800   °[facility]/Dr. Osei-Bonsu ° [address] [place] [address], [place] or [address], [place] (336) 841-8500 Phone number for both [place] and [place] locations is the same.  °[facility] [address], [place] (336) 299-0000   °[facility] [address] [place] [address],  or [address] (336) 852-7530 °(336) 878-2260   °[facility] [address], [place] (336) 350-1642, phone; (336) 294-5005, fax Sees patients 1st and 3rd Saturday of every month.  Must not qualify for public or private insurance (i.e. Medicaid, Medicare, [place] Health Choice, Veterans' Benefits) • Household income should be no more than 200% of the poverty level •The clinic cannot treat you if you are pregnant or think you are pregnant • Sexually transmitted diseases are not treated at the clinic.  ° ° °Dental Care: °Organization         Address  Phone  Notes  °[facility] [address], [place] (336) 641-6152 Accepts children up to age 21 who are enrolled in Medicaid or [place] Health Choice; pregnant women with a Medicaid card; and children who have applied for Medicaid or [place] Health Choice, but were declined, whose parents can pay a reduced fee at time of service.  °[facility] [place]  [address], [place] (336) 641-7733 Accepts children up to age 21 who are enrolled in Medicaid or [place] Health Choice; pregnant  women with a Medicaid card; and children who have applied for Medicaid or [place] Health Choice, but were declined, whose parents can pay a reduced fee at time of service.  °[facility] ° [address], [place] (336) 641-4533 Patients are seen by appointment only. Walk-ins are not accepted. [facility] will see patients 18 years of age and older. °Monday - Tuesday (8am-5pm) °Most Wednesdays (8:30-5pm) °$30 per visit, cash only  °[facility] ° [address], [place] (336) 641-4533 Patients are seen by appointment only. Walk-ins are not accepted. [facility] will see patients 18 years of age and older. °One Wednesday Evening (Monthly: Volunteer Based).  $30 per visit, cash only  °[facility]  (919) 537-3737 for adults; Children under age 4, call Graduate Pediatric Dentistry at (919) [phone]. Children aged 4-14, please call (919) [phone] to request a pediatric application. ° Dental services are provided in all areas of dental care including fillings, crowns and bridges, complete and partial dentures, implants, gum treatment, root canals, and extractions. Preventive care is also provided. Treatment is provided to both adults and children. °Patients are selected via a lottery and there is often a waiting list. °  °[facility] [address], °[place] ° (336) 763-8833 [email or web address] °  °[facility] [address], [place], [place] (336)723-1848, Ext. 123 Second and Fourth Thursday of each month, opens at 6:30 AM; Clinic ends at 9 AM.  Patients are seen on a first-come first-served basis, and a limited number are seen during each clinic.  ° °[facility] ° [address], [place], [place] (336) 723-7904   Eligibility Requirements °You must have lived in [place], [place], or [place] counties for at least the last three months. °  You cannot be eligible for state or federal sponsored  healthcare insurance, including Veterans Administration, Medicaid, or Medicare. °  You generally cannot be eligible for healthcare insurance through your employer.  °  How to apply: °Eligibility screenings are held every Tuesday and Wednesday afternoon from 1:00 pm until 4:00 pm. You do not need an appointment for the interview!  °[facility] [address], [place], [place] 336-631-2330   °[facility]  336-342-8273   °[facility]  336-703-3100   °[facility]  336-570-6415   ° °  Behavioral Health Resources in the Community: °Intensive Outpatient Programs °Organization         Address  Phone  Notes  °[facility] [address], [place], [place] 336-878-6098   °[facility] [address], [place], [place] 336-832-9800   °[facility] [address], [place], [place] ° 336-882-2125   °[facility] [address],  °[place], [place] 1-800-853-5163 or 336-641-4981   °Substance Abuse Resources °Organization         Address  Phone  Notes  °[facility]  336-882-2125   °[facility]  336-784-9470   °[facility]  336-285-9073   °[facility]  336-845-3988   °Residential & Outpatient Substance Abuse Program  1-800-659-3381   °Psychological Services °Organization         Address  Phone  Notes  °[facility]  336- 832-9600   °[facility]  336- 378-7881   °[facility] [address], [place] 1-800-853-5163 or 336-641-4981   ° °Mobile Crisis Teams °Organization         Address  Phone  Notes  °[facility], Mobile Crisis Care Unit  1-877-626-1772   °Assertive °[facility] ° [address] [place], [place] 336-834-9664   °Sharon DeEsch [address] °[place] 336-554-5454   ° °Self-Help/Support Groups °Organization         Address  Phone              Notes  °[facility] - variety of support groups  336- 373-1402 Call for more information  °Narcotics Anonymous (NA), [facility] [address], °[place]  2 meetings at this location  ° °Residential Treatment Programs °Organization         Address  Phone  Notes  °[facility] [address],    °[place]  1-866-801-8205   °[facility] ° [address], [place], [place] 704-293-8524   °[facility] [address], [place] 336-845-3988 Admissions: 8am-3pm M-F  °[facility] [address]    °[place], [place] 336-841-1104   °[facility] [address], [place], [place] 336-379-7146   °[facility] [address]  °[place], [place] 336-285-9073   °[facility] - Intensive Outpatient [address], [place], [place] 336-852-3033   °[facility] [address]  °[place], [place] 1-877-615-2722 or 336-784-9470   °[facility] [address] [place], [place] 336-227-7417 Accepts Medicaid  °[facility] [address]  °[place] 1-800-659-3381 Substance Abuse/Addiction Treatment  ° °[place] Behavioral Health Resources °Organization         Address  Phone  Notes  °[facility]  (888) 581-9988   °Julie Brannon, PhD [address] [place], [place]   (336) 349-5553 or (336) 951-0000   °[facility]   [address] °[place], [place] (336) 349-4454   °[facility] [address], [place], [place] (336) 342-8316 Insurance/Medicaid/sponsorship through [facility]  °[facility] [address]                                    [place], [place] (336) 342-8316 Therapy/tele-psych/case  °[facility] [address]  ° [place],   [place] (336) 349-2233    °Dr. Arfeen  (336) 349-4544   °[facility] [place]  [employer] [place] [facility] 1) [address], [place] °2) [address], [place] °3)  [address], [place] (336) 349-3220 °(336) 342-7768 ° °(336) 342-8140   °[place] [facility] (336) 342-1394 or (336) 342-3537 (After Hours)    ° ° ° °

## 2014-11-04 ENCOUNTER — Encounter (HOSPITAL_COMMUNITY): Payer: Self-pay | Admitting: Emergency Medicine

## 2014-11-04 ENCOUNTER — Emergency Department (HOSPITAL_COMMUNITY)
Admission: EM | Admit: 2014-11-04 | Discharge: 2014-11-06 | Disposition: A | Discharge status: Home / Self Care | Payer: Self-pay | Attending: Emergency Medicine | Admitting: Emergency Medicine

## 2014-11-04 DIAGNOSIS — Z87898 Personal history of other specified conditions: Secondary | ICD-10-CM | POA: Diagnosis present

## 2014-11-04 DIAGNOSIS — F329 Major depressive disorder, single episode, unspecified: Secondary | ICD-10-CM | POA: Insufficient documentation

## 2014-11-04 DIAGNOSIS — I10 Essential (primary) hypertension: Secondary | ICD-10-CM | POA: Insufficient documentation

## 2014-11-04 DIAGNOSIS — F10230 Alcohol dependence with withdrawal, uncomplicated: Secondary | ICD-10-CM

## 2014-11-04 DIAGNOSIS — F32A Depression, unspecified: Secondary | ICD-10-CM

## 2014-11-04 DIAGNOSIS — Z7982 Long term (current) use of aspirin: Secondary | ICD-10-CM | POA: Insufficient documentation

## 2014-11-04 DIAGNOSIS — Z79899 Other long term (current) drug therapy: Secondary | ICD-10-CM | POA: Insufficient documentation

## 2014-11-04 DIAGNOSIS — F1290 Cannabis use, unspecified, uncomplicated: Secondary | ICD-10-CM

## 2014-11-04 DIAGNOSIS — R45851 Suicidal ideations: Secondary | ICD-10-CM

## 2014-11-04 DIAGNOSIS — F1099 Alcohol use, unspecified with unspecified alcohol-induced disorder: Secondary | ICD-10-CM

## 2014-11-04 DIAGNOSIS — F10920 Alcohol use, unspecified with intoxication, uncomplicated: Secondary | ICD-10-CM

## 2014-11-04 DIAGNOSIS — F1490 Cocaine use, unspecified, uncomplicated: Secondary | ICD-10-CM

## 2014-11-04 DIAGNOSIS — Z7952 Long term (current) use of systemic steroids: Secondary | ICD-10-CM | POA: Insufficient documentation

## 2014-11-04 DIAGNOSIS — F1210 Cannabis abuse, uncomplicated: Secondary | ICD-10-CM | POA: Insufficient documentation

## 2014-11-04 DIAGNOSIS — Z720 Tobacco use: Secondary | ICD-10-CM | POA: Insufficient documentation

## 2014-11-04 DIAGNOSIS — F1994 Other psychoactive substance use, unspecified with psychoactive substance-induced mood disorder: Secondary | ICD-10-CM

## 2014-11-04 DIAGNOSIS — Z8719 Personal history of other diseases of the digestive system: Secondary | ICD-10-CM | POA: Insufficient documentation

## 2014-11-04 DIAGNOSIS — F1924 Other psychoactive substance dependence with psychoactive substance-induced mood disorder: Secondary | ICD-10-CM | POA: Insufficient documentation

## 2014-11-04 HISTORY — DX: Suicidal ideations: R45.851

## 2014-11-04 HISTORY — DX: Depression, unspecified: F32.A

## 2014-11-04 LAB — ACETAMINOPHEN LEVEL: Acetaminophen (Tylenol), Serum: <10 ug/mL — ABNORMAL LOW (ref 10–30)

## 2014-11-04 LAB — ETHANOL: Alcohol, Ethyl (B): 166 mg/dL — ABNORMAL HIGH (ref 0–9)

## 2014-11-04 LAB — CBC
HEMATOCRIT: 48.2 % (ref 39.0–52.0)
HEMOGLOBIN: 17.1 g/dL — AB (ref 13.0–17.0)
MCH: 32.2 pg (ref 26.0–34.0)
MCHC: 35.5 g/dL (ref 30.0–36.0)
MCV: 90.8 fL (ref 78.0–100.0)
Platelets: 217 10*3/uL (ref 150–400)
RBC: 5.31 MIL/uL (ref 4.22–5.81)
RDW: 15.1 % (ref 11.5–15.5)
WBC: 7.3 10*3/uL (ref 4.0–10.5)

## 2014-11-04 LAB — RAPID URINE DRUG SCREEN, HOSP PERFORMED
Amphetamines: NOT DETECTED
Barbiturates: NOT DETECTED
Benzodiazepines: NOT DETECTED
Cocaine: NOT DETECTED
Opiates: NOT DETECTED
TETRAHYDROCANNABINOL: POSITIVE — AB

## 2014-11-04 LAB — SALICYLATE LEVEL: Salicylate Lvl: <4 mg/dL (ref 2.8–20.0)

## 2014-11-04 LAB — COMPREHENSIVE METABOLIC PANEL
ALT: 27 U/L (ref 0–53)
AST: 23 U/L (ref 0–37)
Albumin: 4.1 g/dL (ref 3.5–5.2)
Alkaline Phosphatase: 65 U/L (ref 39–117)
Anion gap: 9 (ref 5–15)
BUN: 9 mg/dL (ref 6–23)
CO2: 24 mmol/L (ref 19–32)
CREATININE: 1.02 mg/dL (ref 0.50–1.35)
Calcium: 8.7 mg/dL (ref 8.4–10.5)
Chloride: 106 mmol/L (ref 96–112)
GFR calc Af Amer: >90 mL/min (ref >90)
GFR, EST NON AFRICAN AMERICAN: 83 mL/min — AB (ref >90)
Glucose, Bld: 93 mg/dL (ref 70–99)
Potassium: 3.2 mmol/L — ABNORMAL LOW (ref 3.5–5.1)
SODIUM: 139 mmol/L (ref 135–145)
Total Bilirubin: 1 mg/dL (ref 0.3–1.2)
Total Protein: 7.1 g/dL (ref 6.0–8.3)

## 2014-11-04 MED ORDER — ACETAMINOPHEN 325 MG PO TABS
650.0000 mg | ORAL_TABLET | ORAL | Status: DC | PRN
  Administered 2014-11-04: 650 mg via ORAL
  Filled 2014-11-04: qty 2

## 2014-11-04 MED ORDER — NICOTINE 21 MG/24HR TD PT24
21.0000 mg | MEDICATED_PATCH | Freq: Every day | TRANSDERMAL | Status: DC
  Filled 2014-11-04: qty 1

## 2014-11-04 MED ORDER — POTASSIUM CHLORIDE CRYS ER 20 MEQ PO TBCR
40.0000 meq | EXTENDED_RELEASE_TABLET | Freq: Once | ORAL | Status: AC
  Administered 2014-11-04: 40 meq via ORAL
  Filled 2014-11-04 (×2): qty 2

## 2014-11-04 MED ORDER — ZOLPIDEM TARTRATE 5 MG PO TABS
5.0000 mg | ORAL_TABLET | Freq: Every evening | ORAL | Status: DC | PRN
  Administered 2014-11-05: 5 mg via ORAL
  Filled 2014-11-04: qty 1

## 2014-11-04 MED ORDER — LORAZEPAM 1 MG PO TABS
1.0000 mg | ORAL_TABLET | Freq: Three times a day (TID) | ORAL | Status: DC | PRN
  Administered 2014-11-05: 1 mg via ORAL
  Filled 2014-11-04 (×2): qty 1

## 2014-11-04 MED ORDER — ONDANSETRON HCL 4 MG PO TABS
4.0000 mg | ORAL_TABLET | Freq: Three times a day (TID) | ORAL | Status: DC | PRN
  Filled 2014-11-04: qty 1

## 2014-11-04 NOTE — BH Assessment (Signed)
Attempted to complete assessment; however pt was uncooperative. Pt turned his back to this writer and began snoring loudly. TTS will complete assessment when pt is cooperative. Antony MaduraKelly Humes, PA-C has been informed that pt is uncooperative and TTS will complete assessment at a later time.

## 2014-11-04 NOTE — ED Notes (Signed)
Pt states he needs detox from alcohol, his wife, kids and life. Pt denies SI/HI. Pt cooperative and calm.

## 2014-11-04 NOTE — ED Provider Notes (Signed)
CSN: [id number]     Arrival date & time 11/04/14  0224 History   First MD Initiated Contact with Patient 11/04/14 [phone]     Chief Complaint  Patient presents with  . Alcohol Detox     (Consider location/radiation/quality/duration/timing/severity/associated sxs/prior Treatment) HPI Comments: Patient is a 49 year old male with a history of hypertension, liver cirrhosis, and alcohol abuse. He presents to the emergency Department requesting alcohol detox. Patient was seen for this 6 days ago and was discharged from the ED after evaluation. He reports drinking beer today. Patient will not answer any questions that he is asked in a direct manner and any responses he does give are in a passive aggressive fashion. He states "I want help. I just want help. I need to get my life together". When this writer initiated the encounter and further probed the patient on the reasons behind his presentation he states, "I don't have to answer your questions. I'm drunk and I just want help". I attempted to explain to the patient that he would need to answer the questions being asked in order to get any help.   When asked how much the patient drank he will only respond with "a lot". When patient is asked whether he is experiencing suicidal thoughts, he says "yeah". Patient was asked if he had any specific plan on how he would hurt himself and he replied, "I got plans, but I'm not going to tell you". Patient then asked if he was homicidal. He states, "I have thoughts of hurting people. I may want to hurt you. I may want to hurt someone else." I attempted to explain to the patient that these statements implied possibility of a future action rather than a present thought. Patient continued to be very disrespectful, stating he cannot answer any questions because he is drunk. Patient referring to this writer as "baby" to which he was told that this was disrespectful. He will not elaborate on any illicit drug use. He has 10 packets  of graham crackers and a sandwich at bedside.  The history is provided by the patient. No language interpreter was used.    Past Medical History  Diagnosis Date  . Hypertension   . Liver cirrhosis   . Depression   . History of ETOH abuse    History reviewed. No pertinent past surgical history. History reviewed. No pertinent family history. History  Substance Use Topics  . Smoking status: Current Every Day Smoker  . Smokeless tobacco: Not on file  . Alcohol Use: Yes     Comment: hx abuse- incarcerated at present    Review of Systems  Psychiatric/Behavioral: Positive for behavioral problems.  All other systems reviewed and are negative.   Allergies  Review of patient's allergies indicates no known allergies.  Home Medications   Prior to Admission medications   Medication Sig Start Date End Date Taking? Authorizing Provider  amLODipine (NORVASC) 10 MG tablet Take 10 mg by mouth daily.   Yes Historical Provider, MD  carvedilol (COREG) 6.25 MG tablet Take 6.25 mg by mouth 2 (two) times daily with a meal.   Yes Historical Provider, MD  lisinopril (PRINIVIL,ZESTRIL) 20 MG tablet Take 20 mg by mouth daily.   Yes Historical Provider, MD  aspirin 81 MG tablet Take 81 mg by mouth daily.    Historical Provider, MD  citalopram (CELEXA) 40 MG tablet Take 40 mg by mouth daily.    Historical Provider, MD  cyclobenzaprine (FLEXERIL) 10 MG tablet Take 1 tablet (10  mg total) by mouth 2 (two) times daily as needed for muscle spasms. Patient not taking: Reported on 11/04/2014 07/14/14   Rolland Porter, MD  hydrocortisone cream 0.5 % Apply 1 application topically 4 (four) times daily.    Historical Provider, MD  ibuprofen (ADVIL,MOTRIN) 800 MG tablet Take 1 tablet (800 mg total) by mouth 3 (three) times daily. Patient not taking: Reported on 11/04/2014 02/06/14   Elson Areas, PA-C  ibuprofen (ADVIL,MOTRIN) 800 MG tablet Take 1 tablet (800 mg total) by mouth 3 (three) times daily. Patient not  taking: Reported on 11/04/2014 07/14/14   Rolland Porter, MD  omeprazole (PRILOSEC) 40 MG capsule Take 80 mg by mouth daily.    Historical Provider, MD  thiothixene (NAVANE) 5 MG capsule Take 5 mg by mouth at bedtime.    Historical Provider, MD  topiramate (TOPAMAX) 25 MG tablet Take 25 mg by mouth 3 (three) times daily.    Historical Provider, MD  traZODone (DESYREL) 100 MG tablet Take 100 mg by mouth at bedtime as needed for sleep.    Historical Provider, MD   BP 120/74 mmHg  Temp(Src) 98.6 F (37 C) (Oral)  SpO2 98%   Physical Exam  Constitutional: He is oriented to person, place, and time. He appears well-developed and well-nourished. No distress.  Nontoxic/nonseptic appearing. Patient eating graham crackers with peanut butter and a sandwich.  HENT:  Head: Normocephalic and atraumatic.  Eyes: Conjunctivae and EOM are normal. No scleral icterus.  Neck: Normal range of motion.  Pulmonary/Chest: Effort normal. No respiratory distress.  Respirations even and unlabored  Musculoskeletal: Normal range of motion.  Neurological: He is alert and oriented to person, place, and time. He exhibits normal muscle tone. Coordination normal.  GCS 15. Speech is goal oriented without slurring. Patient moves extremities without ataxia.  Skin: Skin is warm and dry. No rash noted. He is not diaphoretic. No erythema. No pallor.  Psychiatric: His speech is normal. His affect is inappropriate. He is agitated.  Unable to determine true SI/HI; patient reports vague thoughts of both with no specific plan or intent.  Nursing note and vitals reviewed.   ED Course  Procedures (including critical care time) Labs Review Labs Reviewed  ACETAMINOPHEN LEVEL - Abnormal; Notable for the following:    Acetaminophen (Tylenol), Serum <10.0 (*)    All other components within normal limits  CBC - Abnormal; Notable for the following:    Hemoglobin 17.1 (*)    All other components within normal limits  COMPREHENSIVE  METABOLIC PANEL - Abnormal; Notable for the following:    Potassium 3.2 (*)    GFR calc non Af Amer 83 (*)    All other components within normal limits  ETHANOL - Abnormal; Notable for the following:    Alcohol, Ethyl (B) 166 (*)    All other components within normal limits  URINE RAPID DRUG SCREEN (HOSP PERFORMED) - Abnormal; Notable for the following:    Tetrahydrocannabinol POSITIVE (*)    All other components within normal limits  SALICYLATE LEVEL    Imaging Review No results found.   EKG Interpretation None      MDM   Final diagnoses:  Alcohol intoxication, uncomplicated    49 year old male presents to the emergency department requesting psychiatric evaluation. Patient states that he is drunk and needs help getting his life together. Ethanol level 166. Patient with mild hypokalemia; given oral potassium in ED. He has been medically cleared. Will consult TTS regarding further plan of care. Disposition to  be determined by oncoming ED provider.   Filed Vitals:   11/04/14 0439  BP: 120/74  Temp: 98.6 F (37 C)  TempSrc: Oral  SpO2: 98%     Antony Madura, PA-C 11/04/14 0603  Layla Maw Ward, DO 11/04/14 [phone]

## 2014-11-04 NOTE — Consult Note (Signed)
[facility] Face-to-Face Psychiatry Consult   Reason for Consult:  "I need help" Referring Physician:  EDP Patient Identification: Paul Blair MRN:  [id number] Principal Diagnosis: <principal problem not specified> Diagnosis:  There are no active problems to display for this patient.   Total Time spent with patient: 45 minutes  Subjective:   Paul Blair is a 49 y.o. male patient admitted with "wanting help". Pt was interviewed with NP, and chart reviewed. Pt reports that he he has been hurting himself by drinking "too much" and doing drugs (marijuana, cocaine). Pt has been out of meds for a few months. Pt reports being recently locked up "for personal stuff". Pt reports having a headache (and initially covered his head with the sheet), due to elevated BP. Pt reports "I want to hurt everybody". Pt reports experiencing AVH as well. Pt has previously taken depakote and seroquel. Pt is currently separated, and lives in [place].  Per SW note: "Patient was brought into the ED by [facility] because of alcohol intoxication, SI, and HI. Patient continues to endorse suicidal ideations with plan to shoot self with a gun. Patient reports access to a gun at his friend's home. Patient prior to coming into the ED he was expressing homicidal ideations towards a friend and can not remember the threats he was making but his friend encouraged him to get help. He reports currently having A/VH with command to hurt other people. Patient reports a history of mental health treatment and was last seen at [facility] for medication management. He reports being jailed for 7 months recently released in December but not following up with any providers for mental health diagnosis. Patient reports 2 past suicide attempts a couple years ago by overdose and his wife stop him from playing [place] with a gun.   Patient reports substance abuse use includes alcohol started at age of 86, daily, 7-8 40oz beers, and last used 11/03/2014. Reports  he started using THC at age 63, smoking a couple days a week, and last used 11/03/2014. Reports snorting cocaine since the age of 31, daily, 1-2 grams, and last used a couple days ago. Patient reports previous inpatient treatment with [facility], [place], and [facility]."   HPI:  See above HPI Elements:   Location:  depressed. Quality:  severe. Severity:  severe. Timing:  unknown. Duration:  unknown. Context:  drugs/alcohol.  Past Medical History:  Past Medical History  Diagnosis Date  . Hypertension   . Liver cirrhosis   . Depression   . History of ETOH abuse    History reviewed. No pertinent past surgical history. Family History: History reviewed. No pertinent family history. Social History:  History  Alcohol Use  . Yes    Comment: hx abuse- incarcerated at present     History  Drug Use  . Yes  . Special: Cocaine, Marijuana    History   Social History  . Marital Status: Divorced    Spouse Name: N/A  . Number of Children: N/A  . Years of Education: N/A   Social History Main Topics  . Smoking status: Current Every Day Smoker  . Smokeless tobacco: Not on file  . Alcohol Use: Yes     Comment: hx abuse- incarcerated at present  . Drug Use: Yes    Special: Cocaine, Marijuana  . Sexual Activity: Not on file   Other Topics Concern  . None   Social History Narrative   Additional Social History:  Allergies:  No Known Allergies  Vitals: Blood pressure 132/96, pulse 89, temperature 98.6 F (37 C), temperature source Oral, SpO2 98 %.  Risk to Self: Suicidal Ideation: Yes-Currently Present Suicidal Intent: Yes-Currently Present Is patient at risk for suicide?: Yes Suicidal Plan?: Yes-Currently Present Specify Current Suicidal Plan: shoot self in the head Access to Means: Yes Specify Access to Suicidal Means: a gun at friend's home What has been your use of drugs/alcohol within the last 12 months?: alcohol, THC,  cocaine How many times?: 2 Triggers for Past Attempts: Family contact, Spouse contact, Hallucinations, Other (Comment) (Drug use) Intentional Self Injurious Behavior: None Risk to Others: Homicidal Ideation: Yes-Currently Present Thoughts of Harm to Others: No Current Homicidal Intent: No Current Homicidal Plan: No Access to Homicidal Means: No Identified Victim: a friend History of harm to others?: No Assessment of Violence: None Noted Does patient have access to weapons?: Yes (Comment) Criminal Charges Pending?: Yes Describe Pending Criminal Charges: stolen good, child support Does patient have a court date: Yes Prior Inpatient Therapy: Prior Inpatient Therapy: Yes Prior Therapy Facilty/Provider(s): [facility], [facility], [facility] Reason for Treatment: SI, SA Prior Outpatient Therapy: Prior Outpatient Therapy: Yes Prior Therapy Facilty/Provider(s): [facility] Reason for Treatment: Depression  Current Facility-Administered Medications  Medication Dose Route Frequency Provider Last Rate Last Dose  . acetaminophen (TYLENOL) tablet 650 mg  650 mg Oral Q4H PRN Paul Madura, PA-C   650 mg at 11/04/14 0959  . LORazepam (ATIVAN) tablet 1 mg  1 mg Oral Q8H PRN Paul Madura, PA-C      . nicotine (NICODERM CQ - dosed in mg/24 hours) patch 21 mg  21 mg Transdermal Daily Paul Madura, PA-C   21 mg at 11/04/14 1006  . ondansetron (ZOFRAN) tablet 4 mg  4 mg Oral Q8H PRN Paul Madura, PA-C      . zolpidem (AMBIEN) tablet 5 mg  5 mg Oral QHS PRN Paul Madura, PA-C       Current Outpatient Prescriptions  Medication Sig Dispense Refill  . amLODipine (NORVASC) 10 MG tablet Take 10 mg by mouth daily.    . carvedilol (COREG) 6.25 MG tablet Take 6.25 mg by mouth 2 (two) times daily with a meal.    . lisinopril (PRINIVIL,ZESTRIL) 20 MG tablet Take 20 mg by mouth daily.    Marland Kitchen aspirin 81 MG tablet Take 81 mg by mouth daily.    . citalopram (CELEXA) 40 MG tablet Take 40 mg by mouth daily.    . cyclobenzaprine (FLEXERIL)  10 MG tablet Take 1 tablet (10 mg total) by mouth 2 (two) times daily as needed for muscle spasms. (Patient not taking: Reported on 11/04/2014) 20 tablet 0  . hydrocortisone cream 0.5 % Apply 1 application topically 4 (four) times daily.    Marland Kitchen ibuprofen (ADVIL,MOTRIN) 800 MG tablet Take 1 tablet (800 mg total) by mouth 3 (three) times daily. (Patient not taking: Reported on 11/04/2014) 21 tablet 0  . ibuprofen (ADVIL,MOTRIN) 800 MG tablet Take 1 tablet (800 mg total) by mouth 3 (three) times daily. (Patient not taking: Reported on 11/04/2014) 21 tablet 0  . omeprazole (PRILOSEC) 40 MG capsule Take 80 mg by mouth daily.    Marland Kitchen thiothixene (NAVANE) 5 MG capsule Take 5 mg by mouth at bedtime.    . topiramate (TOPAMAX) 25 MG tablet Take 25 mg by mouth 3 (three) times daily.    . traZODone (DESYREL) 100 MG tablet Take 100 mg by mouth at bedtime as needed for sleep.  Musculoskeletal: Strength & Muscle Tone: within normal limits Gait & Station: normal Patient leans: N/A  Psychiatric Specialty Exam: Physical Exam  ROS  Blood pressure 132/96, pulse 89, temperature 98.6 F (37 C), temperature source Oral, SpO2 98 %.There is no weight on file to calculate BMI.  General Appearance: Guarded  Eye Contact::  Poor  Speech:  Normal Rate  Volume:  Normal  Mood:  Depressed  Affect:  Congruent and Depressed  Thought Process:  Goal Directed  Orientation:  Full (Time, Place, and Person)  Thought Content:  Hallucinations: Auditory Command:  to hurt self and others Visual  Suicidal Thoughts:  Yes.  with intent/plan  Homicidal Thoughts:  Yes.  without intent/plan  Memory:  Negative  Judgement:  Poor  Insight:  Shallow  Psychomotor Activity:  Normal  Concentration:  Fair  Recall:  FiservFair  Fund of Knowledge:Fair  Language: Fair  Akathisia:  Negative  Handed:  Right  AIMS (if indicated):     Assets:  Desire for Improvement  ADL's:  Intact  Cognition: WNL  Sleep:      Medical Decision Making: New  problem, with additional work up planned and Review of Medication Regimen & Side Effects (2)  Treatment Plan Summary: Daily contact with patient to assess and evaluate symptoms and progress in treatment, Medication management and Plan to admit for safety/stabilization.  Plan:  Recommend psychiatric Inpatient admission when medically cleared. Disposition: admit for safety/stabilization  Paul Blair, Paul Blair 11/04/2014 12:21 PM

## 2014-11-04 NOTE — ED Notes (Addendum)
Pt has three personal belongings bags, searched by security Joycelyn Man(Zimmerman, RomneyAlec). 1 pair of sneakers, 1 coat, 1 jeans, 1 thermal, 1shirt. Bags currently sit behind triage nursing station.

## 2014-11-04 NOTE — BHH Counselor (Signed)
Binnie RailJoann Glover, [facility], confirms adult unit is currently at capacity. Contacted the following facilities for placement:  BED AVAILABLE, FAXED CLINICAL INFORMATION: [facility], [facility], per A[facility]He[facility]  AT CAPACITY: [facility], per [facility], [facility], [facility], [facility], [facility], [facility], [facility], per [facility], per [facility], [facility], per Leticia PennaLarry Brynn [facility], per [facility], per [facility], per [facility], per Judeth CornfieldStephanie  NO RESPONSE: [facility]  Pt not appropriate for [facility] or [facility] due to documentation of current suicidal ideation.   Harlin RainFord Ellis Ria CommentWarrick Jr, LPC, [facility] Triage Specialist [phone]

## 2014-11-04 NOTE — ED Provider Notes (Signed)
Patient seen this morning and discuss with him about his intentions for alcohol detox. Continues to deny SI. Patient initially did not want to be seen by TTS  But after a conversation he is agreeable to their assessment  Paul BakerAnthony T Meliah Appleman, MD 11/04/14 0730

## 2014-11-04 NOTE — ED Notes (Signed)
Pt wanded by security. Voluntary.

## 2014-11-04 NOTE — BH Assessment (Signed)
Assessment Note  Paul Blair is an 49 y.o. male. Patient was brought into the ED by [facility] because of alcohol intoxication, SI, and HI.  Patient continues to endorse suicidal ideations with plan to shoot self with a gun.  Patient reports access to a gun at his friend's home.  Patient prior to coming into the ED he was expressing homicidal ideations towards a friend and can not remember the threats he was making but his friend encouraged him to get help.  He reports currently having A/VH with command to hurt other people.  Patient reports a history of mental health treatment and was last seen at [facility] for medication management.  He reports being jailed for 7 months recently released in December but not following up with any providers for mental health diagnosis.  Patient reports 2 past suicide attempts a couple years ago by overdose and his wife stop him from playing [place] with a gun.    Patient reports substance abuse use includes alcohol started at age of 34, daily, 7-8 40oz beers, and last used 11/03/2014.  Reports he started using THC at age 77, smoking a couple days a week, and last used 11/03/2014. Reports snorting cocaine since the age of 1, daily, 1-2 grams, and last used a couple days ago.  Patient reports previous inpatient treatment with [facility], [place], and [facility].    Disposition is currently pending.   Axis I: Substance Induced Mood Disorder Axis II: Deferred Axis III:  Past Medical History  Diagnosis Date  . Hypertension   . Liver cirrhosis   . Depression   . History of ETOH abuse    Axis IV: economic problems, housing problems, occupational problems, other psychosocial or environmental problems, problems related to legal system/crime, problems related to social environment, problems with access to health care services and problems with primary support group Axis V: 41-50 serious symptoms  Past Medical History:  Past Medical History  Diagnosis Date  .  Hypertension   . Liver cirrhosis   . Depression   . History of ETOH abuse     History reviewed. No pertinent past surgical history.  Family History: History reviewed. No pertinent family history.  Social History:  reports that he has been smoking.  He does not have any smokeless tobacco history on file. He reports that he drinks alcohol. He reports that he uses illicit drugs (Cocaine and Marijuana).  Additional Social History:     CIWA: CIWA-Ar BP: 120/74 mmHg COWS:    Allergies: No Known Allergies  Home Medications:  (Not in a hospital admission)  OB/GYN Status:  No LMP for male patient.  General Assessment Data Location of Assessment: [facility] ACT Assessment: Yes Is this a Tele or Face-to-Face Assessment?: Face-to-Face Is this an Initial Assessment or a Re-assessment for this encounter?: Initial Assessment Living Arrangements: Non-relatives/Friends Can pt return to current living arrangement?: Yes Admission Status: Voluntary Is patient capable of signing voluntary admission?: Yes Transfer from: Home Referral Source: Self/Family/Friend  Medical Screening Exam [facility] Walk-in ONLY) Medical Exam completed: Yes  [facility] Crisis Care Plan Living Arrangements: Non-relatives/Friends Name of Psychiatrist: nonen Name of Therapist: none  Education Status Is patient currently in school?: No  Risk to self with the past 6 months Suicidal Ideation: Yes-Currently Present Suicidal Intent: Yes-Currently Present Is patient at risk for suicide?: Yes Suicidal Plan?: Yes-Currently Present Specify Current Suicidal Plan: shoot self in the head Access to Means: Yes Specify Access to Suicidal Means: a gun at friend's  home What has been your use of drugs/alcohol within the last 12 months?: alcohol, THC, cocaine Previous Attempts/Gestures: Yes How many times?: 2 Triggers for Past Attempts: Family contact, Spouse contact, Hallucinations, Other (Comment) (Drug use) Intentional Self Injurious  Behavior: None Family Suicide History: No Recent stressful life event(s): Conflict (Comment), Loss (Comment), Financial Problems, Legal Issues Persecutory voices/beliefs?: No Depression: Yes Depression Symptoms: Guilt, Loss of interest in usual pleasures, Feeling worthless/self pity, Feeling angry/irritable (hopelessness) Substance abuse history and/or treatment for substance abuse?: Yes  Risk to Others within the past 6 months Homicidal Ideation: Yes-Currently Present Thoughts of Harm to Others: No Current Homicidal Intent: No Current Homicidal Plan: No Access to Homicidal Means: No Identified Victim: a friend History of harm to others?: No Assessment of Violence: None Noted Does patient have access to weapons?: Yes (Comment) Criminal Charges Pending?: Yes Describe Pending Criminal Charges: stolen good, child support Does patient have a court date: Yes  Psychosis Hallucinations: Auditory, Visual, With command  Mental Status Report Appear/Hygiene: Disheveled, In hospital gown Eye Contact: Poor Motor Activity: Unremarkable Speech: Logical/coherent Level of Consciousness: Alert Mood: Irritable, Guilty, Anxious Affect: Anxious Anxiety Level: Minimal Thought Processes: Coherent Judgement: Partial Orientation: Person, Place, Time, Situation Obsessive Compulsive Thoughts/Behaviors: None  Cognitive Functioning Concentration: Fair Memory: Recent Intact, Remote Intact IQ: Average Insight: Poor Impulse Control: Poor Appetite: Fair Sleep: Decreased Vegetative Symptoms: None  ADLScreening [facility] Assessment Services) Patient's cognitive ability adequate to safely complete daily activities?: Yes Patient able to express need for assistance with ADLs?: Yes Independently performs ADLs?: Yes (appropriate for developmental age)  Prior Inpatient Therapy Prior Inpatient Therapy: Yes [facility] Reason for Treatment: SI, SA  Prior  Outpatient Therapy Prior Outpatient Therapy: Yes Prior Therapy Facilty/Provider(s): [facility] Reason for Treatment: Depression  ADL Screening (condition at time of admission) Patient's cognitive ability adequate to safely complete daily activities?: Yes Patient able to express need for assistance with ADLs?: Yes Independently performs ADLs?: Yes (appropriate for developmental age)                  Additional Information 1:1 In Past 12 Months?: No CIRT Risk: No Elopement Risk: No Does patient have medical clearance?: Yes     Disposition:  Disposition Initial Assessment Completed for this Encounter: Yes Disposition of Patient: Other dispositions Other disposition(s): Other (Comment) (Pending)  On Site Evaluation by:   Reviewed with Physician:    Maryelizabeth Rowanorbett, Zavion Sleight A 11/04/2014 7:54 AM

## 2014-11-05 DIAGNOSIS — F10929 Alcohol use, unspecified with intoxication, unspecified: Secondary | ICD-10-CM | POA: Insufficient documentation

## 2014-11-05 DIAGNOSIS — R45850 Homicidal ideations: Secondary | ICD-10-CM

## 2014-11-05 DIAGNOSIS — F39 Unspecified mood [affective] disorder: Secondary | ICD-10-CM

## 2014-11-05 DIAGNOSIS — R45851 Suicidal ideations: Secondary | ICD-10-CM

## 2014-11-05 MED ORDER — FLUOXETINE HCL 20 MG PO CAPS
20.0000 mg | ORAL_CAPSULE | Freq: Every day | ORAL | Status: DC
  Administered 2014-11-05 – 2014-11-06 (×2): 20 mg via ORAL
  Filled 2014-11-05 (×2): qty 1

## 2014-11-05 MED ORDER — AMLODIPINE BESYLATE 10 MG PO TABS
10.0000 mg | ORAL_TABLET | Freq: Every day | ORAL | Status: DC
  Administered 2014-11-05 – 2014-11-06 (×2): 10 mg via ORAL
  Filled 2014-11-05 (×2): qty 1

## 2014-11-05 MED ORDER — CARVEDILOL 6.25 MG PO TABS
6.2500 mg | ORAL_TABLET | Freq: Two times a day (BID) | ORAL | Status: DC
  Administered 2014-11-05 – 2014-11-06 (×3): 6.25 mg via ORAL
  Filled 2014-11-05 (×4): qty 1

## 2014-11-05 NOTE — Consult Note (Signed)
[facility] Face-to-Face Psychiatry Consult   Reason for Consult:  "I need help" Referring Physician:  EDP Patient Identification: Paul Blair MRN:  [id number] Principal Diagnosis: <principal problem not specified> Diagnosis:   Patient Active Problem List   Diagnosis Date Noted  . Mood disorder [F39] 11/04/2014    Total Time spent with patient: 45 minutes  Subjective:   Paul Blair is a 49 y.o. male patient admitted with "wanting help". Pt was interviewed with NP, and chart reviewed. Pt reports that he he has been hurting himself by drinking "too much" and doing drugs (marijuana, cocaine). Pt has been out of meds for a few months. Pt reports being recently locked up "for personal stuff". Pt reports having a headache (and initially covered his head with the sheet), due to elevated BP. Pt reports "I want to hurt everybody". Pt reports experiencing AVH as well. Pt has previously taken depakote and seroquel. Pt is currently separated, and lives in [place].  Per SW note: "Patient was brought into the ED by [facility] because of alcohol intoxication, SI, and HI. Patient continues to endorse suicidal ideations with plan to shoot self with a gun. Patient reports access to a gun at his friend's home. Patient prior to coming into the ED he was expressing homicidal ideations towards a friend and can not remember the threats he was making but his friend encouraged him to get help. He reports currently having A/VH with command to hurt other people. Patient reports a history of mental health treatment and was last seen at [facility] for medication management. He reports being jailed for 7 months recently released in December but not following up with any providers for mental health diagnosis. Patient reports 2 past suicide attempts a couple years ago by overdose and his wife stop him from playing [place] with a gun.   Patient reports substance abuse use includes alcohol started at age of 39, daily, 7-8 40oz beers,  and last used 11/03/2014. Reports he started using THC at age 50, smoking a couple days a week, and last used 11/03/2014. Reports snorting cocaine since the age of 39, daily, 1-2 grams, and last used a couple days ago. Patient reports previous inpatient treatment with [facility], and [facility]."   Reviewed above note with updates from today.  Patient reports feeling helpless and hopeless.  He is still endorsing suicide and stated nothing has changed from yesterday.  Patient reported homicidal ideation but did not specify who he want to kill.  Patient reported AVH but was unable to explain what he is hearing or seeing today.  We will continue with our plan of care which is to seek placement at any hospital with available bed.  HPI:  See above HPI Elements:   Location:  depressed. Quality:  severe. Severity:  severe. Timing:  unknown. Duration:  unknown. Context:  drugs/alcohol.  Past Medical History:  Past Medical History  Diagnosis Date  . Hypertension   . Liver cirrhosis   . Depression   . History of ETOH abuse    History reviewed. No pertinent past surgical history. Family History: History reviewed. No pertinent family history. Social History:  History  Alcohol Use  . Yes    Comment: hx abuse- incarcerated at present     History  Drug Use  . Yes  . Special: Cocaine, Marijuana    History   Social History  . Marital Status: Divorced    Spouse Name: N/A  . Number of Children: N/A  .  Years of Education: N/A   Social History Main Topics  . Smoking status: Current Every Day Smoker  . Smokeless tobacco: Not on file  . Alcohol Use: Yes     Comment: hx abuse- incarcerated at present  . Drug Use: Yes    Special: Cocaine, Marijuana  . Sexual Activity: Not on file   Other Topics Concern  . None   Social History Narrative   Additional Social History:    Allergies:  No Known Allergies  Vitals: Blood pressure 187/94, pulse 87, temperature 98.7 F  (37.1 C), temperature source Oral, resp. rate 18, SpO2 97 %.  Risk to Self: Suicidal Ideation: Yes-Currently Present Suicidal Intent: Yes-Currently Present Is patient at risk for suicide?: Yes Suicidal Plan?: Yes-Currently Present Specify Current Suicidal Plan: shoot self in the head Access to Means: Yes Specify Access to Suicidal Means: a gun at friend's home What has been your use of drugs/alcohol within the last 12 months?: alcohol, THC, cocaine How many times?: 2 Triggers for Past Attempts: Family contact, Spouse contact, Hallucinations, Other (Comment) (Drug use) Intentional Self Injurious Behavior: None Risk to Others: Homicidal Ideation: Yes-Currently Present Thoughts of Harm to Others: No Current Homicidal Intent: No Current Homicidal Plan: No Access to Homicidal Means: No Identified Victim: a friend History of harm to others?: No Assessment of Violence: None Noted Does patient have access to weapons?: Yes (Comment) Criminal Charges Pending?: Yes Describe Pending Criminal Charges: stolen good, child support Does patient have a court date: Yes Prior Inpatient Therapy: Prior Inpatient Therapy: Yes Prior Therapy Facilty/Provider(s): [place] [facility], [facility], [facility] Reason for Treatment: SI, SA Prior Outpatient Therapy: Prior Outpatient Therapy: Yes Prior Therapy Facilty/Provider(s): [facility] Reason for Treatment: Depression  Current Facility-Administered Medications  Medication Dose Route Frequency Provider Last Rate Last Dose  . acetaminophen (TYLENOL) tablet 650 mg  650 mg Oral Q4H PRN Antony MaduraKelly Humes, PA-C   650 mg at 11/04/14 0959  . amLODipine (NORVASC) tablet 10 mg  10 mg Oral Daily Earney NavyJosephine C Ohm Dentler, NP   10 mg at 11/05/14 1228  . carvedilol (COREG) tablet 6.25 mg  6.25 mg Oral BID WC Earney NavyJosephine C Jakylan Ron, NP   6.25 mg at 11/05/14 1228  . FLUoxetine (PROZAC) capsule 20 mg  20 mg Oral Daily Earney NavyJosephine C Riddik Senna, NP   20 mg at 11/05/14 1227  . LORazepam (ATIVAN) tablet 1 mg  1 mg Oral  Q8H PRN Antony MaduraKelly Humes, PA-C      . nicotine (NICODERM CQ - dosed in mg/24 hours) patch 21 mg  21 mg Transdermal Daily Antony MaduraKelly Humes, PA-C   21 mg at 11/04/14 1006  . ondansetron (ZOFRAN) tablet 4 mg  4 mg Oral Q8H PRN Antony MaduraKelly Humes, PA-C      . zolpidem (AMBIEN) tablet 5 mg  5 mg Oral QHS PRN Antony MaduraKelly Humes, PA-C       Current Outpatient Prescriptions  Medication Sig Dispense Refill  . amLODipine (NORVASC) 10 MG tablet Take 10 mg by mouth daily.    . carvedilol (COREG) 6.25 MG tablet Take 6.25 mg by mouth 2 (two) times daily with a meal.    . lisinopril (PRINIVIL,ZESTRIL) 20 MG tablet Take 20 mg by mouth daily.    . cyclobenzaprine (FLEXERIL) 10 MG tablet Take 1 tablet (10 mg total) by mouth 2 (two) times daily as needed for muscle spasms. (Patient not taking: Reported on 11/04/2014) 20 tablet 0  . ibuprofen (ADVIL,MOTRIN) 800 MG tablet Take 1 tablet (800 mg total) by mouth 3 (three) times daily. (  Patient not taking: Reported on 11/04/2014) 21 tablet 0  . ibuprofen (ADVIL,MOTRIN) 800 MG tablet Take 1 tablet (800 mg total) by mouth 3 (three) times daily. (Patient not taking: Reported on 11/04/2014) 21 tablet 0    Musculoskeletal: Strength & Muscle Tone: within normal limits Gait & Station: normal Patient leans: N/A  Psychiatric Specialty Exam: Physical Exam  ROS  Blood pressure 187/94, pulse 87, temperature 98.7 F (37.1 C), temperature source Oral, resp. rate 18, SpO2 97 %.There is no weight on file to calculate BMI.  General Appearance: Guarded  Eye Contact::  Poor  Speech:  Normal Rate  Volume:  Normal  Mood:  Depressed  Affect:  Congruent and Depressed  Thought Process:  Goal Directed  Orientation:  Full (Time, Place, and Person)  Thought Content:  Hallucinations: Auditory Command:  to hurt self and others Visual  Suicidal Thoughts:  Yes.  with intent/plan  Homicidal Thoughts:  Yes.  without intent/plan  Memory:  Negative  Judgement:  Poor  Insight:  Shallow  Psychomotor Activity:   Normal  Concentration:  Fair  Recall:  [employer] of Knowledge:Fair  Language: Fair  Akathisia:  Negative  Handed:  Right  AIMS (if indicated):     Assets:  Desire for Improvement  ADL's:  Intact  Cognition: WNL  Sleep:      Medical Decision Making: New problem, with additional work up planned and Review of Medication Regimen & Side Effects (2)  Treatment Plan Summary: Daily contact with patient to assess and evaluate symptoms and progress in treatment, Medication management and Plan to admit for safety/stabilization.  Plan:  Recommend psychiatric Inpatient admission when medically cleared. Disposition: admit for safety/stabilization  Dahlia Byes, C 11/05/2014 4:27 PM

## 2014-11-05 NOTE — ED Notes (Addendum)
Patient seen awake watching TV. Flat affect. Endorses depression, SI with no plan. Denies pain at this time. Patient stated "Everything is still the same. I haven't seen any improvement on my condition". Support and encouragement offered to patient. Patient encouraged to continue with the treatment plan. Will continue to monitor patient for safety and stability.

## 2014-11-06 DIAGNOSIS — F1994 Other psychoactive substance use, unspecified with psychoactive substance-induced mood disorder: Secondary | ICD-10-CM

## 2014-11-06 DIAGNOSIS — F10230 Alcohol dependence with withdrawal, uncomplicated: Secondary | ICD-10-CM

## 2014-11-06 DIAGNOSIS — Z87898 Personal history of other specified conditions: Secondary | ICD-10-CM | POA: Diagnosis present

## 2014-11-06 HISTORY — DX: Other psychoactive substance use, unspecified with psychoactive substance-induced mood disorder: F19.94

## 2014-11-06 NOTE — Consult Note (Signed)
[facility]   Reason for Consult:  Alcohol detox, suicidal ideations Referring Physician:  EDP Patient Identification: Paul SalvageJay Blair MRN:  [id number] Principal Diagnosis: Alcohol dependence with uncomplicated withdrawal Diagnosis:   Patient Active Problem List   Diagnosis Date Noted  . Alcohol dependence with uncomplicated withdrawal [F10.230] 11/06/2014    Priority: High  . Substance induced mood disorder [F19.94] 11/06/2014    Priority: High  . Alcohol intoxication [F10.129]     Total Time spent with patient: 30 minutes  Subjective:   Paul Blair is a 49 y.o. male patient has stabilized.  HPI:  The patient was released from prison in December.  He has past prison sentences for assault on a male and violating a B50 on his wife.  He was abusing cocaine and alcohol when he started having suicidal ideations.  Vonna KotykJay has been here since this weekend and has stabilized.  He has two court dates pending and requests resources to go to rehab.  Rehab resources provided.  Denies suicidal/homicidal ideations, hallucinations, and withdrawal symptoms. HPI Elements:   Location:  generalized. Quality:  acute. Severity:  mild. Timing:  intermittent. Duration:  brief. Context:  stressors.  Past Medical History:  Past Medical History  Diagnosis Date  . Hypertension   . Liver cirrhosis   . Depression   . History of ETOH abuse    History reviewed. No pertinent past surgical history. Family History: History reviewed. No pertinent family history. Social History:  History  Alcohol Use  . Yes    Comment: hx abuse- incarcerated at present     History  Drug Use  . Yes  . Special: Cocaine, Marijuana    History   Social History  . Marital Status: Divorced    Spouse Name: N/A  . Number of Children: N/A  . Years of Education: N/A   Social History Main Topics  . Smoking status: Current Every Day Smoker  . Smokeless tobacco: Not on file  . Alcohol Use: Yes   Comment: hx abuse- incarcerated at present  . Drug Use: Yes    Special: Cocaine, Marijuana  . Sexual Activity: Not on file   Other Topics Concern  . None   Social History Narrative   Additional Social History:                          Allergies:  No Known Allergies  Vitals: Blood pressure 134/94, pulse 61, temperature 98 F (36.7 C), temperature source Oral, resp. rate 18, SpO2 98 %.  Risk to Self: Suicidal Ideation: Yes-Currently Present Suicidal Intent: Yes-Currently Present Is patient at risk for suicide?: Yes Suicidal Plan?: Yes-Currently Present Specify Current Suicidal Plan: shoot self in the head Access to Means: Yes Specify Access to Suicidal Means: a gun at friend's home What has been your use of drugs/alcohol within the last 12 months?: alcohol, THC, cocaine How many times?: 2 Triggers for Past Attempts: Family contact, Spouse contact, Hallucinations, Other (Comment) (Drug use) Intentional Self Injurious Behavior: None Risk to Others: Homicidal Ideation: Yes-Currently Present Thoughts of Harm to Others: No Current Homicidal Intent: No Current Homicidal Plan: No Access to Homicidal Means: No Identified Victim: a friend History of harm to others?: No Assessment of Violence: None Noted Does patient have access to weapons?: Yes (Comment) Criminal Charges Pending?: Yes Describe Pending Criminal Charges: stolen good, child support Does patient have a court date: Yes Prior Inpatient Therapy: Prior Inpatient Therapy: Yes Prior Therapy Facilty/Provider(s): [place],  [facility], [facility] Reason for Treatment: SI, SA Prior Outpatient Therapy: Prior Outpatient Therapy: Yes Prior Therapy Facilty/Provider(s): [facility] Reason for Treatment: Depression  No current facility-administered medications for this encounter.   Current Outpatient Prescriptions  Medication Sig Dispense Refill  . amLODipine (NORVASC) 10 MG tablet Take 10 mg by mouth daily.    . carvedilol (COREG)  6.25 MG tablet Take 6.25 mg by mouth 2 (two) times daily with a meal.    . lisinopril (PRINIVIL,ZESTRIL) 20 MG tablet Take 20 mg by mouth daily.    . cyclobenzaprine (FLEXERIL) 10 MG tablet Take 1 tablet (10 mg total) by mouth 2 (two) times daily as needed for muscle spasms. (Patient not taking: Reported on 11/04/2014) 20 tablet 0  . ibuprofen (ADVIL,MOTRIN) 800 MG tablet Take 1 tablet (800 mg total) by mouth 3 (three) times daily. (Patient not taking: Reported on 11/04/2014) 21 tablet 0  . ibuprofen (ADVIL,MOTRIN) 800 MG tablet Take 1 tablet (800 mg total) by mouth 3 (three) times daily. (Patient not taking: Reported on 11/04/2014) 21 tablet 0    Musculoskeletal: Strength & Muscle Tone: within normal limits Gait & Station: normal Patient leans: N/A  Psychiatric Specialty Exam:     Blood pressure 134/94, pulse 61, temperature 98 F (36.7 C), temperature source Oral, resp. rate 18, SpO2 98 %.There is no weight on file to calculate BMI.  General Appearance: Casual  Eye Contact::  Good  Speech:  Normal Rate  Volume:  Normal  Mood:  Euthymic  Affect:  Congruent  Thought Process:  Coherent  Orientation:  Full (Time, Place, and Person)  Thought Content:  WDL  Suicidal Thoughts:  No  Homicidal Thoughts:  No  Memory:  Immediate;   Good Recent;   Good Remote;   Good  Judgement:  Fair  Insight:  Fair  Psychomotor Activity:  Normal  Concentration:  Good  Recall:  Good  Fund of Knowledge:Good  Language: Good  Akathisia:  No  Handed:  Right  AIMS (if indicated):     Assets:  Leisure Time Physical Health Resilience  ADL's:  Intact  Cognition: WNL  Sleep:      Medical Decision Making: Review of Psycho-Social Stressors (1), Review or order clinical lab tests (1) and Review of Medication Regimen & Side Effects (2)  Treatment Plan Summary: Daily contact with patient to assess and evaluate symptoms and progress in treatment, Medication management and Plan Discharge home with follow-up  [facility]  Plan:  No evidence of imminent risk to self or others at present.   Disposition: Discharge with a bus pass, follow-up with [facility] and rehab resources  Nanine Means, PMH-NP 11/06/2014 5:44 PM I have personally seen the patient and agreed with the findings and involved in the treatment plan. Thedore Mins, MD

## 2014-11-06 NOTE — BH Assessment (Signed)
[facility] Assessment Progress Note  Per Thedore MinsMojeed Akintayo, MD this pt does not require psychiatric hospitalization at this time. He is to be discharged with referral information for area substance abuse treatment providers. This information has been included in pt's discharge instructions. Pt's nurse, Dawnaly, has been notified.  Doylene Canninghomas Song Myre, MA Triage Specialist 11/06/2014 @ 10:34

## 2014-11-06 NOTE — BHH Counselor (Signed)
Contacted the following facilities for placement:  No appropriate beds currently available.  AT CAPACITY: [facility], per [facility], per [facility], per [facility], per [facility], per [facility], per [facility] per [facility], [facility], per [facility], per [facility], per [facility], per [facility], per [facility], per [facility], per [facility]  NO RESPONSE: [facility]   Paul Blair, LPC, NCC Triage Specialist [phone]   

## 2014-11-06 NOTE — Discharge Instructions (Signed)
To help you maintain a sober lifestyle, a substance abuse treatment program may be helpful to you.  Contact the following treatment providers to see about enrolling in their programs:  RESIDENTIAL:       [facility]      [address]      [place], [place] [address]      [phone]       [facility]      [address]      [place], [place] [address]      [phone]  OUTPATIENT:       [facility]      [address]      [place], [place] [address]      [phone]

## 2014-11-10 NOTE — [facility] Suicide Risk Assessment (Signed)
Suicide Risk Assessment  Discharge Assessment   [facility]   Demographic Factors:  Male and Low socioeconomic status  Total Time spent with patient: 30 minutes  Musculoskeletal: Strength & Muscle Tone: within normal limits Gait & Station: normal Patient leans: N/A  Psychiatric Specialty Exam:     Blood pressure 134/94, pulse 61, temperature 98 F (36.7 C), temperature source Oral, resp. rate 18, SpO2 98 %.There is no weight on file to calculate BMI.  General Appearance: Casual  Eye Contact::  Good  Speech:  Normal Rate  Volume:  Normal  Mood:  Euthymic  Affect:  Congruent  Thought Process:  Coherent  Orientation:  Full (Time, Place, and Person)  Thought Content:  WDL  Suicidal Thoughts:  No  Homicidal Thoughts:  No  Memory:  Immediate;   Good Recent;   Good Remote;   Good  Judgement:  Fair  Insight:  Fair  Psychomotor Activity:  Normal  Concentration:  Good  Recall:  Good  Fund of Knowledge:Good  Language: Good  Akathisia:  No  Handed:  Right  AIMS (if indicated):     Assets:  Leisure Time Physical Health Resilience  ADL's:  Intact  Cognition: WNL  Sleep:         Has this patient used any form of tobacco in the last 30 days? (Cigarettes, Smokeless Tobacco, Cigars, and/or Pipes) Yes, A prescription for an FDA-approved tobacco cessation medication was offered at discharge and the patient refused  Mental Status Per Nursing Assessment::   On Admission:   Cocaine abuse with suicidal ideations  Current Mental Status by Physician: NA  Loss Factors: NA  Historical Factors: NA  Risk Reduction Factors:   Sense of responsibility to family and Positive therapeutic relationship  Continued Clinical Symptoms:  None  Cognitive Features That Contribute To Risk:  None    Suicide Risk:  Minimal: No identifiable suicidal ideation.  Patients presenting with no risk factors but with morbid ruminations; may be classified as minimal risk  based on the severity of the depressive symptoms  Principal Problem: Alcohol dependence with uncomplicated withdrawal Discharge Diagnoses:  Patient Active Problem List   Diagnosis Date Noted  . Alcohol dependence with uncomplicated withdrawal [F10.230] 11/06/2014    Priority: High  . Substance induced mood disorder [F19.94] 11/06/2014    Priority: High  . Alcohol intoxication [F10.129]       Plan Of Care/Follow-up recommendations:  Activity:  as tolerated Diet:  heart healthy diet  Is patient on multiple antipsychotic therapies at discharge:  No   Has Patient had three or more failed trials of antipsychotic monotherapy by history:  No  Recommended Plan for Multiple Antipsychotic Therapies: NA    LORD, JAMISON, PMH-NP 11/10/2014, 3:18 PM

## 2016-05-10 DIAGNOSIS — F609 Personality disorder, unspecified: Secondary | ICD-10-CM | POA: Insufficient documentation

## 2016-05-10 DIAGNOSIS — F1411 Cocaine abuse, in remission: Secondary | ICD-10-CM | POA: Insufficient documentation

## 2016-05-10 DIAGNOSIS — F1420 Cocaine dependence, uncomplicated: Secondary | ICD-10-CM | POA: Insufficient documentation

## 2016-05-10 DIAGNOSIS — I10 Essential (primary) hypertension: Secondary | ICD-10-CM | POA: Insufficient documentation

## 2016-05-10 DIAGNOSIS — F1220 Cannabis dependence, uncomplicated: Secondary | ICD-10-CM | POA: Insufficient documentation

## 2016-05-15 ENCOUNTER — Encounter (HOSPITAL_COMMUNITY): Payer: Self-pay | Admitting: Emergency Medicine

## 2016-05-15 ENCOUNTER — Emergency Department (HOSPITAL_COMMUNITY)
Admission: EM | Admit: 2016-05-15 | Discharge: 2016-05-18 | Disposition: A | Discharge status: Home / Self Care | Payer: Medicare Other | Attending: Emergency Medicine | Admitting: Emergency Medicine

## 2016-05-15 DIAGNOSIS — N390 Urinary tract infection, site not specified: Secondary | ICD-10-CM | POA: Diagnosis not present

## 2016-05-15 DIAGNOSIS — Z79899 Other long term (current) drug therapy: Secondary | ICD-10-CM | POA: Diagnosis not present

## 2016-05-15 DIAGNOSIS — F17200 Nicotine dependence, unspecified, uncomplicated: Secondary | ICD-10-CM | POA: Insufficient documentation

## 2016-05-15 DIAGNOSIS — I10 Essential (primary) hypertension: Secondary | ICD-10-CM | POA: Insufficient documentation

## 2016-05-15 DIAGNOSIS — F1910 Other psychoactive substance abuse, uncomplicated: Secondary | ICD-10-CM | POA: Diagnosis not present

## 2016-05-15 DIAGNOSIS — R45851 Suicidal ideations: Secondary | ICD-10-CM

## 2016-05-15 DIAGNOSIS — F1994 Other psychoactive substance use, unspecified with psychoactive substance-induced mood disorder: Secondary | ICD-10-CM | POA: Diagnosis present

## 2016-05-15 DIAGNOSIS — R45850 Homicidal ideations: Secondary | ICD-10-CM | POA: Diagnosis not present

## 2016-05-15 DIAGNOSIS — R1084 Generalized abdominal pain: Secondary | ICD-10-CM | POA: Diagnosis not present

## 2016-05-15 HISTORY — DX: Cocaine abuse, uncomplicated: F14.10

## 2016-05-15 HISTORY — DX: Other psychoactive substance use, unspecified with psychoactive substance-induced mood disorder: F19.94

## 2016-05-15 HISTORY — DX: Suicidal ideations: R45.851

## 2016-05-15 HISTORY — DX: Homicidal ideations: R45.850

## 2016-05-15 LAB — COMPREHENSIVE METABOLIC PANEL
ALBUMIN: 3.8 g/dL (ref 3.5–5.0)
ALT: 19 U/L (ref 17–63)
AST: 17 U/L (ref 15–41)
Alkaline Phosphatase: 73 U/L (ref 38–126)
Anion gap: 10 (ref 5–15)
BUN: 16 mg/dL (ref 6–20)
CHLORIDE: 105 mmol/L (ref 101–111)
CO2: 24 mmol/L (ref 22–32)
Calcium: 9.5 mg/dL (ref 8.9–10.3)
Creatinine, Ser: 1.31 mg/dL — ABNORMAL HIGH (ref 0.61–1.24)
GFR calc Af Amer: >60 mL/min (ref >60)
Glucose, Bld: 105 mg/dL — ABNORMAL HIGH (ref 65–99)
POTASSIUM: 3.4 mmol/L — AB (ref 3.5–5.1)
Sodium: 139 mmol/L (ref 135–145)
Total Bilirubin: 0.9 mg/dL (ref 0.3–1.2)
Total Protein: 6.5 g/dL (ref 6.5–8.1)

## 2016-05-15 LAB — RAPID URINE DRUG SCREEN, HOSP PERFORMED
AMPHETAMINES: NOT DETECTED
BENZODIAZEPINES: NOT DETECTED
Barbiturates: NOT DETECTED
Cocaine: POSITIVE — AB
OPIATES: NOT DETECTED
TETRAHYDROCANNABINOL: POSITIVE — AB

## 2016-05-15 LAB — ACETAMINOPHEN LEVEL
Acetaminophen (Tylenol), Serum: <10 ug/mL — ABNORMAL LOW (ref 10–30)
Acetaminophen (Tylenol), Serum: <10 ug/mL — ABNORMAL LOW (ref 10–30)

## 2016-05-15 LAB — CBC
HCT: 46.8 % (ref 39.0–52.0)
HEMOGLOBIN: 16 g/dL (ref 13.0–17.0)
MCH: 31 pg (ref 26.0–34.0)
MCHC: 34.2 g/dL (ref 30.0–36.0)
MCV: 90.7 fL (ref 78.0–100.0)
PLATELETS: 305 10*3/uL (ref 150–400)
RBC: 5.16 MIL/uL (ref 4.22–5.81)
RDW: 15.2 % (ref 11.5–15.5)
WBC: 8 10*3/uL (ref 4.0–10.5)

## 2016-05-15 LAB — ETHANOL

## 2016-05-15 LAB — CBG MONITORING, ED: Glucose-Capillary: 139 mg/dL — ABNORMAL HIGH (ref 65–99)

## 2016-05-15 LAB — SALICYLATE LEVEL: Salicylate Lvl: <4 mg/dL (ref 2.8–30.0)

## 2016-05-15 MED ORDER — THIAMINE HCL 100 MG/ML IJ SOLN: 100.0000 mg | Freq: Every day | INTRAMUSCULAR | Status: DC

## 2016-05-15 MED ORDER — IBUPROFEN 400 MG PO TABS: 600.0000 mg | ORAL_TABLET | Freq: Three times a day (TID) | ORAL | Status: DC | PRN

## 2016-05-15 MED ORDER — NICOTINE 21 MG/24HR TD PT24
21.0000 mg | MEDICATED_PATCH | Freq: Every day | TRANSDERMAL | Status: DC
  Administered 2016-05-15 – 2016-05-18 (×4): 21 mg via TRANSDERMAL
  Filled 2016-05-15 (×4): qty 1

## 2016-05-15 MED ORDER — ONDANSETRON HCL 4 MG PO TABS
4.0000 mg | ORAL_TABLET | Freq: Three times a day (TID) | ORAL | Status: DC | PRN
  Administered 2016-05-18: 4 mg via ORAL
  Filled 2016-05-15: qty 1

## 2016-05-15 MED ORDER — VITAMIN B-1 100 MG PO TABS
100.0000 mg | ORAL_TABLET | Freq: Every day | ORAL | Status: DC
  Administered 2016-05-15 – 2016-05-18 (×4): 100 mg via ORAL
  Filled 2016-05-15 (×4): qty 1

## 2016-05-15 NOTE — ED Notes (Signed)
All clothes removed and pt in paper scrubs.  Staffing office notified of need of sitter.  Security called to wand pt

## 2016-05-15 NOTE — ED Provider Notes (Signed)
[facility]-EMER[facility]ENCY DEPT Provider Note   CSN: [id number] Arrival date & time: 05/15/16  1630     History   Chief Complaint Chief Complaint  Patient presents with  . Suicidal    HPI Paul Blair is a 50 y.o. male.  HPI   Patient is a 50 year old male with history of depression, alcohol abuse, hypertension, cocaine abuse, liver cirrhosis, substance induced mood disorder, who presents emergency department complaining of suicidal ideations, homicidal ideations and auditory hallucinations or voices tell him to hurt himself and hurt others.  He last did cocaine yesterday, drinks 1-2 packs of beer a day, states that he brought pills off of someone who took a handful of them this morning, he also reports Tylenol ingestion, both of which she is attempting to hurt himself.  He was evaluated yesterday after the reported Tylenol ingestion, the records no Tylenol in his system. They document his baseline behavior as "manipulative and malingering."  The patient today claims he wishes to get help and get back on psychiatric medications, stating that in the past they have made the voices become quiet.  His wife reportedly left his because she "didn't want to loose him" to his many diseases.  He reports friends avoid him because of irratic behavior and multiple stays in jail for assault.  He endorses depression and"the shakes" in his hands and arms.  He denies SOB, CP, abdominal pain, N, V.  He denies visual hallucinations.   Past Medical History:  Diagnosis Date  . Depression   . History of ETOH abuse   . Hypertension   . Liver cirrhosis [facility])     Patient Active Problem List   Diagnosis Date Noted  . Alcohol dependence with uncomplicated withdrawal (HCC) 11/06/2014  . Substance induced mood disorder (HCC) 11/06/2014  . Alcohol intoxication (HCC)     History reviewed. No pertinent surgical history.     Home Medications    Prior to Admission medications   Medication Sig Start Date End Date  Taking? Authorizing Provider  albuterol (PROVENTIL HFA;VENTOLIN HFA) 108 (90 Base) MCG/ACT inhaler Inhale 2 puffs into the lungs every 6 (six) hours as needed. 03/15/15   Historical Provider, MD  amLODipine (NORVASC) 10 MG tablet Take 10 mg by mouth daily. 05/11/16 06/10/16  Historical Provider, MD  carvedilol (COREG) 6.25 MG tablet Take 6.25 mg by mouth 2 (two) times daily with a meal.    Historical Provider, MD  citalopram (CELEXA) 20 MG tablet Take 20 mg by mouth daily. 05/10/16   Historical Provider, MD  cyclobenzaprine (FLEXERIL) 10 MG tablet Take 1 tablet (10 mg total) by mouth 2 (two) times daily as needed for muscle spasms. Patient not taking: Reported on 11/04/2014 07/14/14   Rolland Porter, MD  ibuprofen (ADVIL,MOTRIN) 800 MG tablet Take 1 tablet (800 mg total) by mouth 3 (three) times daily. Patient not taking: Reported on 11/04/2014 02/06/14   Elson Areas, PA-C  traZODone (DESYREL) 150 MG tablet Take 150 mg by mouth at bedtime.    Historical Provider, MD    Family History No family history on file.  Social History Social History  Substance Use Topics  . Smoking status: Current Every Day Smoker  . Smokeless tobacco: Never Used  . Alcohol use Yes     Comment: hx abuse- incarcerated at present     Allergies   Review of patient's allergies indicates no known allergies.   Review of Systems Review of Systems  Constitutional: Negative.   HENT: Negative.   Eyes: Negative.  Respiratory: Negative.   Cardiovascular: Negative.   Gastrointestinal: Negative.   Endocrine: Negative.   Genitourinary: Positive for difficulty urinating (difficulty initiating stream, chronic problem) and dysuria. Negative for decreased urine volume, discharge, frequency, penile pain, penile swelling, scrotal swelling, testicular pain and urgency.  Musculoskeletal: Negative.   Neurological: Positive for tremors. Negative for dizziness, syncope, speech difficulty, weakness, light-headedness, numbness and  headaches.  Hematological: Negative.   Psychiatric/Behavioral: Positive for behavioral problems, hallucinations and suicidal ideas. Negative for confusion, decreased concentration and self-injury.  All other systems reviewed and are negative.    Physical Exam Updated Vital Signs BP 154/80   Pulse 65   Temp 98.1 F (36.7 C) (Oral)   Resp 12   Ht 6\' 1"  (1.854 m)   Wt 102.1 kg   SpO2 95%   BMI 29.69 kg/m   Physical Exam  Constitutional: He is oriented to person, place, and time. He appears well-developed and well-nourished. He is cooperative.  Non-toxic appearance. He does not have a sickly appearance. He does not appear ill. No distress.  HENT:  Head: Normocephalic and atraumatic.  Right Ear: External ear normal.  Left Ear: External ear normal.  Nose: Nose normal.  Mouth/Throat: Oropharynx is clear and moist. No oropharyngeal exudate.  Eyes: Conjunctivae and EOM are normal. Pupils are equal, round, and reactive to light. Right eye exhibits no discharge. Left eye exhibits no discharge. No scleral icterus.  Neck: Normal range of motion. Neck supple. No JVD present.  Cardiovascular: Normal rate, regular rhythm, normal heart sounds and intact distal pulses.  Exam reveals no gallop and no friction rub.   No murmur heard. Pulmonary/Chest: Effort normal and breath sounds normal. No respiratory distress. He has no wheezes. He has no rales. He exhibits no tenderness.  Abdominal: Soft. Bowel sounds are normal. He exhibits no distension and no mass. There is no tenderness. There is no guarding.  Musculoskeletal: Normal range of motion. He exhibits no edema.  Lymphadenopathy:    He has no cervical adenopathy.  Neurological: He is alert and oriented to person, place, and time. He has normal strength. He displays no tremor. No cranial nerve deficit or sensory deficit. He exhibits normal muscle tone. Coordination and gait normal. GCS eye subscore is 4. GCS verbal subscore is 5. GCS motor subscore  is 6.  Skin: Skin is warm and dry. Capillary refill takes less than 2 seconds. No rash noted. He is not diaphoretic. No erythema. No pallor.  Psychiatric: He has a normal mood and affect. His behavior is normal. Judgment and thought content normal.  Nursing note and vitals reviewed.    ED Treatments / Results  Labs (all labs ordered are listed, but only abnormal results are displayed) Labs Reviewed  COMPREHENSIVE METABOLIC PANEL - Abnormal; Notable for the following:       Result Value   Potassium 3.4 (*)    Glucose, Bld 105 (*)    Creatinine, Ser 1.31 (*)    All other components within normal limits  ACETAMINOPHEN LEVEL - Abnormal; Notable for the following:    Acetaminophen (Tylenol), Serum <10 (*)    All other components within normal limits  URINE RAPID DRUG SCREEN, HOSP PERFORMED - Abnormal; Notable for the following:    Cocaine POSITIVE (*)    Tetrahydrocannabinol POSITIVE (*)    All other components within normal limits  URINALYSIS, ROUTINE W REFLEX MICROSCOPIC (NOT AT [facility]) - Abnormal; Notable for the following:    Color, Urine AMBER (*)    APPearance HAZY (*)  Hgb urine dipstick TRACE (*)    Leukocytes, UA MODERATE (*)    All other components within normal limits  ACETAMINOPHEN LEVEL - Abnormal; Notable for the following:    Acetaminophen (Tylenol), Serum <10 (*)    All other components within normal limits  URINE MICROSCOPIC-ADD ON - Abnormal; Notable for the following:    Squamous Epithelial / LPF 0-5 (*)    Bacteria, UA FEW (*)    Casts HYALINE CASTS (*)    Crystals CA OXALATE CRYSTALS (*)    All other components within normal limits  CBG MONITORING, ED - Abnormal; Notable for the following:    Glucose-Capillary 139 (*)    All other components within normal limits  ETHANOL  SALICYLATE LEVEL  CBC    EKG  EKG Interpretation  Date/Time:  Thursday May 15 2016 20:24:35 EDT Ventricular Rate:  66 PR Interval:    QRS Duration: 92 QT Interval:  418 QTC  Calculation: 438 R Axis:   35 Text Interpretation:  Sinus rhythm Probable left atrial enlargement Abnormal R-wave progression, early transition Borderline T abnormalities, diffuse leads No significant change since last tracing Confirmed by Bebe Shaggy  MD, DONALD ([id number]) on 05/15/2016 9:00:23 PM       Radiology No results found.  Procedures Procedures (including critical care time)  Medications Ordered in ED Medications  thiamine (VITAMIN B-1) tablet 100 mg (100 mg Oral Given 05/15/16 2232)    Or  thiamine (B-1) injection 100 mg ( Intravenous See Alternative 05/15/16 2232)  ondansetron (ZOFRAN) tablet 4 mg (not administered)  ibuprofen (ADVIL,MOTRIN) tablet 600 mg (not administered)  nicotine (NICODERM CQ - dosed in mg/24 hours) patch 21 mg (21 mg Transdermal Patch Applied 05/15/16 2233)  albuterol (PROVENTIL HFA;VENTOLIN HFA) 108 (90 Base) MCG/ACT inhaler 2 puff (not administered)  amLODipine (NORVASC) tablet 10 mg (not administered)  carvedilol (COREG) tablet 6.25 mg (not administered)     Initial Impression / Assessment and Plan / ED Course  I have reviewed the triage vital signs and the nursing notes.  Pertinent labs & imaging results that were available during my care of the patient were reviewed by me and considered in my medical decision making (see chart for details).  Clinical Course   Pt with SI, HI and auditory hallucinations.  He has vague reports of attempted overdose of known pills, bought on the street, was evaluated yesterday at [facility] where he reported Tylenol overdose without any objective findings, and negative Tylenol level and discharge with notation of baseline psychiatric behavior of "manipulation and malingering" he has negative tylenol levels x 2 here in the ED.  Tox screen + for cocaine and THC.  He thought he bought narcotics from someone, but no opiates detected, pt is alert, articulate, maintains eye contact.  Has hx of alcohol abuse, is  well-appearing, no slurred speech, no tremor.  No concern for DT's.  Last drink yesterday, last cocaine use yesterday.  Patient is generally well-appearing, vital signs stable.  The patient's presentation appears consistent with the documentation from [facility] where he is manipulative and may be malingering. Discussed his presentation and reported OD with attending EDP who advised serial tylenol levels, and if negative, he is medically cleared.  Pt is voluntary.  He claims that he wants help and wants to get back on his psychiatric medicines. TTS consult pending.  Will disposition according to their recommendations.  Final Clinical Impressions(s) / ED Diagnoses   Final diagnoses:  Suicidal ideation    New  Prescriptions New Prescriptions   No medications on file     Danelle BerryLeisa Teagan Ozawa, PA-C 05/16/16 0049    Zadie Rhineonald Wickline, MD 05/16/16 [phone]

## 2016-05-15 NOTE — ED Notes (Signed)
Ordered dinner tray.  

## 2016-05-15 NOTE — ED Triage Notes (Signed)
Pt to ED with c/o overdose, st's he took (10) 300mg  of clonidine about 1 hour ago.  Then pt st's it wasn't clonidine it was a pain pill (yellow capsule).   Pt also st's he took (20) Tylenol 325mg  yesterday and was seen and discharged from [facility].  Pt st's he is homeless

## 2016-05-15 NOTE — ED Notes (Signed)
Went over [employer] Guide with pt, provided with final snack for the evening, and discussed the rest of his anticipated care.  All questions answered.

## 2016-05-16 DIAGNOSIS — F1910 Other psychoactive substance abuse, uncomplicated: Secondary | ICD-10-CM | POA: Diagnosis not present

## 2016-05-16 LAB — URINALYSIS, ROUTINE W REFLEX MICROSCOPIC
Bilirubin Urine: NEGATIVE
Glucose, UA: NEGATIVE mg/dL
Ketones, ur: NEGATIVE mg/dL
Nitrite: NEGATIVE
Protein, ur: NEGATIVE mg/dL
Specific Gravity, Urine: 1.03 (ref 1.005–1.030)
pH: 5.5 (ref 5.0–8.0)

## 2016-05-16 LAB — URINE MICROSCOPIC-ADD ON

## 2016-05-16 MED ORDER — CARVEDILOL 12.5 MG PO TABS
6.2500 mg | ORAL_TABLET | Freq: Two times a day (BID) | ORAL | Status: DC
  Administered 2016-05-16 – 2016-05-18 (×5): 6.25 mg via ORAL
  Filled 2016-05-16 (×5): qty 1

## 2016-05-16 MED ORDER — LORAZEPAM 1 MG PO TABS
1.0000 mg | ORAL_TABLET | Freq: Four times a day (QID) | ORAL | Status: DC | PRN
  Administered 2016-05-16 – 2016-05-17 (×3): 1 mg via ORAL
  Filled 2016-05-16 (×3): qty 1

## 2016-05-16 MED ORDER — ALBUTEROL SULFATE HFA 108 (90 BASE) MCG/ACT IN AERS
2.0000 | INHALATION_SPRAY | Freq: Four times a day (QID) | RESPIRATORY_TRACT | Status: DC | PRN
Start: 2016-05-16 — End: 2016-05-18

## 2016-05-16 MED ORDER — AMLODIPINE BESYLATE 5 MG PO TABS
10.0000 mg | ORAL_TABLET | Freq: Every day | ORAL | Status: DC
  Administered 2016-05-16 – 2016-05-18 (×3): 10 mg via ORAL
  Filled 2016-05-16 (×3): qty 2

## 2016-05-16 MED ORDER — ARIPIPRAZOLE 5 MG PO TABS
5.0000 mg | ORAL_TABLET | Freq: Every day | ORAL | Status: DC
  Administered 2016-05-16 – 2016-05-18 (×3): 5 mg via ORAL
  Filled 2016-05-16 (×3): qty 1

## 2016-05-16 MED ORDER — CITALOPRAM HYDROBROMIDE 10 MG PO TABS
10.0000 mg | ORAL_TABLET | Freq: Every day | ORAL | Status: DC
  Administered 2016-05-16 – 2016-05-18 (×3): 10 mg via ORAL
  Filled 2016-05-16 (×4): qty 1

## 2016-05-16 MED ORDER — CEPHALEXIN 250 MG PO CAPS
1000.0000 mg | ORAL_CAPSULE | Freq: Two times a day (BID) | ORAL | Status: DC
  Administered 2016-05-16 – 2016-05-18 (×6): 1000 mg via ORAL
  Filled 2016-05-16 (×6): qty 4

## 2016-05-16 NOTE — Progress Notes (Signed)
CSW informed attending EDP and RN of NP's recommendation for hold for overnight eval until shelter is found or beds available in [place].  Writer to continue to follow up.  Melbourne Abtsatia Cason Luffman, LCSWA Disposition staff 05/16/2016 8:01 PM

## 2016-05-16 NOTE — Progress Notes (Signed)
CSW received call from [facility] with Disposition informing CSW that patient would not be needing inpatient treatment.   Stacy GardnerErin Derry Arbogast, LCSWA Clinical Social Worker [phone]

## 2016-05-16 NOTE — ED Notes (Signed)
[facility], patient states he is now feeling more discouraged and concerned he is not going to receive the help he needs/wants. This RN utilized therapeutic communication and encouraged patient to explain why he felt this way. Pt [facility] they need to consult with [facility] member before a decision can be made. This RN explained to patient that ED staff will [facility] so that we can find a plan that is appropriate for him. Patient states he just wants to get some help so that he can get better. Pt remains calm, cooperative and pleasant.

## 2016-05-16 NOTE — ED Notes (Signed)
Breakfast tray delivered to patient
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
## 2016-05-16 NOTE — ED Notes (Signed)
Spoke with patient regarding plan of care. Stated TTS will be performed a second time. Patient indicated would like inpatient treatment.

## 2016-05-16 NOTE — ED Notes (Signed)
Patient was given a snack and drink. A regular diet was ordered for lunch. 

## 2016-05-16 NOTE — ED Notes (Signed)
Snack and drink given to patient. A regular dinner order was taken.

## 2016-05-16 NOTE — ED Notes (Signed)
TTS completed session with another patient. Stated to place TTS computer in patient's and will complete second TTS shortly.

## 2016-05-16 NOTE — ED Notes (Signed)
Pt is requesting to be made a XXX patient for privacy, states he does not want visitors or people knowing he is here.

## 2016-05-16 NOTE — ED Notes (Signed)
Pt requesting medications to help him sleep and "take the voices away".  Urine results also back.  Spoke to MD, who will place orders.

## 2016-05-16 NOTE — ED Notes (Signed)
Regular Diet breakfast ordered for patient.

## 2016-05-16 NOTE — ED Notes (Signed)
Lunch tray given to patient

## 2016-05-16 NOTE — ED Notes (Signed)
TTS called and currently performing evaluation at this time.
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
## 2016-05-16 NOTE — ED Notes (Signed)
HS snack given.

## 2016-05-16 NOTE — ED Notes (Signed)
Patient was given Apple Juice with a cup of ice.

## 2016-05-16 NOTE — ED Notes (Signed)
Pt given dinner tray.

## 2016-05-17 DIAGNOSIS — F1910 Other psychoactive substance abuse, uncomplicated: Secondary | ICD-10-CM | POA: Diagnosis not present

## 2016-05-17 LAB — I-STAT CHEM 8, ED
BUN: 10 mg/dL (ref 6–20)
CHLORIDE: 100 mmol/L — AB (ref 101–111)
CREATININE: 0.9 mg/dL (ref 0.61–1.24)
Calcium, Ion: 1.12 mmol/L — ABNORMAL LOW (ref 1.15–1.40)
GLUCOSE: 114 mg/dL — AB (ref 65–99)
HCT: 49 % (ref 39.0–52.0)
Hemoglobin: 16.7 g/dL (ref 13.0–17.0)
POTASSIUM: 3.2 mmol/L — AB (ref 3.5–5.1)
Sodium: 141 mmol/L (ref 135–145)
TCO2: 26 mmol/L (ref 0–100)

## 2016-05-17 MED ORDER — POTASSIUM CHLORIDE CRYS ER 20 MEQ PO TBCR
40.0000 meq | EXTENDED_RELEASE_TABLET | Freq: Once | ORAL | Status: AC
  Administered 2016-05-17: 40 meq via ORAL
  Filled 2016-05-17: qty 2

## 2016-05-17 MED ORDER — CALCIUM CARBONATE ANTACID 500 MG PO CHEW
2.0000 | CHEWABLE_TABLET | Freq: Once | ORAL | Status: AC
  Administered 2016-05-18: 400 mg via ORAL
  Filled 2016-05-17: qty 2

## 2016-05-17 NOTE — ED Notes (Signed)
Pt attempted to make phone call - no answer.
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
## 2016-05-17 NOTE — Progress Notes (Signed)
CSW contacted the [facility] at [phone]. CSW was informed by a staff representative at [facility] that there are no bed availability for a male tonight. CSW also contacted the [facility] at [phone] to check for bed availability. CSW was unable to speak with a staff member, but left her contact number.    Ernestina Columbiayiesha Meda Dudzinski, LCSWA [facility] Clinical Social Worker [phone]

## 2016-05-17 NOTE — ED Notes (Signed)
Pt asked sitter to as RN to look up weekly rates for nearby hotel/motels for him.

## 2016-05-17 NOTE — ED Notes (Signed)
Talking w/ sitter 

## 2016-05-17 NOTE — ED Notes (Signed)
Paul Blair, SW, in w/pt. Dr Rhunette CroftNanavati ordering telepsych - requesting for pt to be evaluated by psychiatrist.

## 2016-05-17 NOTE — ED Notes (Addendum)
Dr Rhunette CroftNanavati aware no note has been entered as of yet from [facility] - advised he had asked for psych to consult or [facility] NP, to discuss w/psych and enter note. Paulla DollyKenisha, AC, [facility] advised she had spoken w/Tyiesha, SW, who [place].

## 2016-05-17 NOTE — ED Notes (Signed)
Dr Rhunette CroftNanavati discussing pt w/Tyeisha, SW.

## 2016-05-17 NOTE — ED Provider Notes (Signed)
[facility]  I was informed by the counselor that patient is possibly looking for secondary gain, since he is homeless. I talked to the patient. I agree that he is not decompensated as far as his psychoses is concerned. Pt makes it clear though, that he has suicidal and homicidal thought and has money to get cocaine. He also is very frustrated that he has no shelter to go to. SW has looked for shelters and we couldn't find a spot. SW and I went in together, we gave him the resources - and he just dismissed them. He doesn't care for outpatient resources.  I have asked the BH to run the case by psychiatrist. I wish patient was more open to trying outpatient care, as he does need help - but with [facility] and now refusing outpatient care from us, I am pretty convinced that will not follow through on the expectation we have from him.   Derwood KaplanAnkit Muskaan Smet, MD 05/17/16 1201
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
## 2016-05-17 NOTE — Progress Notes (Signed)
CSW followed-up with Dr.Nanavati regarding pt and was informed that pt is on hold for discharge for telepsych request.      Paul Blair, Silverio LayLCSWA [place] [facility] Clinical Social Worker [phone]

## 2016-05-17 NOTE — ED Notes (Signed)
Pt now awake, given a snack at this time.

## 2016-05-17 NOTE — ED Notes (Signed)
Pt on phone at nurses' desk. Asked if he could make one more phone call - advised yes.

## 2016-05-17 NOTE — ED Notes (Addendum)
Dr Eudelia Bunchardama aware pt's Chem 8 I-stat resulted - Potassium 3.2 - order received for Potassium 40meq po. Also aware creatinine has improved - 0.90.

## 2016-05-17 NOTE — ED Notes (Signed)
Per Fredna Dowakia, NP, [facility] - pt cleared from psych. Dr Rhunette CroftNanavati in w/pt.

## 2016-05-17 NOTE — ED Notes (Addendum)
Pt appears to be calm, cooperative. No self-injurious behaviors noted.

## 2016-05-17 NOTE — ED Notes (Addendum)
Pt talking on phone at nurses' desk. Sitter w/pt.

## 2016-05-17 NOTE — Progress Notes (Signed)
[facility] with [facility] and Malachy Chamberakia Starkes, Nurse Practitioner regarding pt. CSW was informed by Nurse Practitioner that Psychiatrist will not be able to see pt today, which another consult for telepsych has been put in. Nurse Practitioner informed CSW that pt is currently on hold to be seen and may be reassesed tonight.    Paul Blair, LCSWA [facility] Clinical Social Worker [phone]

## 2016-05-17 NOTE — ED Notes (Signed)
Loma Ricayeisha, SW, in w/pt.

## 2016-05-17 NOTE — ED Notes (Addendum)
Lying on bed watching tv. 

## 2016-05-17 NOTE — ED Notes (Addendum)
Pt given Rice Krispies treat and apple juice as requested for snack. Pt returned to sleeping.

## 2016-05-17 NOTE — Progress Notes (Signed)
CSW spoke with pt this morning in regards to resources. Pt stated that he wants help and wants to get his life back on track. Pt stated that he is interested in receiving treatment for his mental illness and his addiction. Pt stated he is homeless and does not have any family or support system. CSW informed pt that she would provide him with a list of resources for homelessness and treatment. CSW will also call to see about bed availiabilty in the [place] area. Pt stated to CSW that if he would leave hospital today that he would result back to what he knows (behavior patterns).    Paul Blair, LCSWA [facility]  Clinical Social Worker [phone]

## 2016-05-17 NOTE — ED Notes (Signed)
Dr Rhunette CroftNanavati and [place] Parkyiesha, SW, in w/pt.

## 2016-05-17 NOTE — ED Notes (Signed)
Pt sitting on bed watching tv and talking w/sitter.

## 2016-05-17 NOTE — ED Notes (Addendum)
Per Paulla DollyKenisha, AC, [facility] - pt cleared by psych. Requesting SW to help w/homelss shelters.

## 2016-05-17 NOTE — Progress Notes (Signed)
CSW spoke with Marcelle SmilingSamantha Taylor, TTS Counselor at [facility] in regards to a homeless resource for the pt. TTS Counselor will be faxing CSW information on the [facility] to provide to the pt.  Ernestina Columbiayiesha Marionette Meskill, LCSWA [facility] [place] Clinical Social Worker [phone]

## 2016-05-18 ENCOUNTER — Encounter (HOSPITAL_COMMUNITY): Payer: Self-pay | Admitting: *Deleted

## 2016-05-18 DIAGNOSIS — F17200 Nicotine dependence, unspecified, uncomplicated: Secondary | ICD-10-CM | POA: Diagnosis not present

## 2016-05-18 DIAGNOSIS — I10 Essential (primary) hypertension: Secondary | ICD-10-CM | POA: Insufficient documentation

## 2016-05-18 DIAGNOSIS — R45850 Homicidal ideations: Secondary | ICD-10-CM

## 2016-05-18 DIAGNOSIS — R1084 Generalized abdominal pain: Secondary | ICD-10-CM | POA: Insufficient documentation

## 2016-05-18 DIAGNOSIS — R45851 Suicidal ideations: Secondary | ICD-10-CM | POA: Diagnosis not present

## 2016-05-18 DIAGNOSIS — R1013 Epigastric pain: Secondary | ICD-10-CM | POA: Diagnosis present

## 2016-05-18 DIAGNOSIS — Z79899 Other long term (current) drug therapy: Secondary | ICD-10-CM | POA: Diagnosis not present

## 2016-05-18 DIAGNOSIS — F1994 Other psychoactive substance use, unspecified with psychoactive substance-induced mood disorder: Secondary | ICD-10-CM

## 2016-05-18 DIAGNOSIS — F1910 Other psychoactive substance abuse, uncomplicated: Secondary | ICD-10-CM | POA: Diagnosis not present

## 2016-05-18 MED ORDER — CEPHALEXIN 500 MG PO CAPS: 500.0000 mg | ORAL_CAPSULE | Freq: Three times a day (TID) | ORAL | 0 refills | Status: AC

## 2016-05-18 MED ORDER — CITALOPRAM HYDROBROMIDE 20 MG PO TABS: 20.0000 mg | ORAL_TABLET | Freq: Every day | ORAL | 0 refills | Status: DC

## 2016-05-18 MED ORDER — ARIPIPRAZOLE 5 MG PO TABS: 5.0000 mg | ORAL_TABLET | Freq: Every day | ORAL | 0 refills | Status: DC

## 2016-05-18 NOTE — ED Notes (Signed)
Pt lying in bed talking w/sitter.

## 2016-05-18 NOTE — ED Notes (Signed)
Dr Marcello MooresIsaac at bedside discussing dc plan with Paul Blair. Paul Blair agrees to plan. Paul Blair making a phone call at this time.

## 2016-05-18 NOTE — ED Notes (Signed)
Dr Marcello MooresIsaac made aware of psych clearing pt for d/c. MD will come to c22 to assess pt prior to providing dc instructions and paperwork.

## 2016-05-18 NOTE — ED Notes (Signed)
Pt woke to take meds. When sitter advised him he appeared to have been sleeping well d/t pt was snoring. Pt states "I wasn't sleeping". Then states "Well, I must have needed it".

## 2016-05-18 NOTE — ED Notes (Addendum)
Dr Shela CommonsJ at bedside assessing pt

## 2016-05-18 NOTE — ED Triage Notes (Signed)
The pt  Is c/o abd pain with nausea today  He has ahd one bm with bright red blood mixed in with his stool.   He also has not voided since before 1800  Hx of the same

## 2016-05-18 NOTE — ED Notes (Signed)
Dr Madilyn Hookees aware Dr Oneta RackJonnalaggadda to perform psych consult today (per Carla Drapeyiesha, SW's conversation w/Leo, SW, [facility]).

## 2016-05-18 NOTE — ED Notes (Signed)
C/o nausea.  Medicated with zofran per order.

## 2016-05-18 NOTE — ED Notes (Signed)
Vanilla ice cream given as requested for snack. Lunch order request received.

## 2016-05-18 NOTE — ED Notes (Addendum)
Pt has eaten 50% of lunch so far. Pt aware psych is expected to consult with him today. Voiced understanding and agreement w/tx plan.

## 2016-05-18 NOTE — ED Notes (Addendum)
States he wants medication that will help stop the voices faster. States dr prescribed med to help and told him he would also give him rx to decrease side effects of tremors. States he did not want to take d/t does not want to have tremors. States did not eat lunch yesterday - only ate icing off carrot cake. RN advised pt he ate 100% of his lunch yesterday. Pt states he does he did not think he did.

## 2016-05-18 NOTE — ED Provider Notes (Signed)
[facility] and [facility] that patient is again cleared for discharge. Labs, records reviewed. Patient has reported SI, HI but there is a strong concern that he is malingering due to recent homelessness. SW has been involved and I again discussed options, referral with them again today. Patient has been provided with all resources in the area, although unfortunately he has demonstrated recurrent reluctance to follow these up. He has also refused psych meds, follow-up in the past. Nonetheless, he is currently clinically stable and denies any SI, HI, or AVH. Per discussion with TTS and SW/CM, will discharge. Patient is to follow-up with [facility]/outpatient psych on Tuesday. I will provide him with a short-term prescription for keflex for UTI (noted on screenign labs, no fevers, flank pain, leukocytosis, or signs of pyelo), as well as his psych meds. Return precautions given. Pt frustrated that he is discharged but expresses understanding.   Shaune Pollackameron Lailynn Southgate, MD 05/19/16 [phone]

## 2016-05-18 NOTE — Progress Notes (Signed)
CSW provided pt with resources for outpatient and substance abuse treatment on yesterday. Pt stated he had the same resources and did not want them. Pt eventually took resources before being placed on hold for discharge on yesterday.   Ernestina Columbiayiesha Saraih Lorton, LCSWA [facility] [place] Clinical Social Worker [phone]

## 2016-05-18 NOTE — ED Notes (Signed)
Pt now awake - asking if his "brother-in-law" called or came to visit. Advised no. Pt on phone attempting to call him.

## 2016-05-18 NOTE — ED Notes (Addendum)
Pt d/c home. All belongings given back to pt, pt signed inventory sheet for returned items. Dc instructions and follow up instructions stressed and reviewed with pt prior to dc. Pt came and cooperative at this time.  Pt given a bus pass.
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
## 2016-05-18 NOTE — Progress Notes (Signed)
[facility] regards to pt. CSW was informed that pt has been placed on Dr. Elsie SaasJonnalagadda consult list to be seen today.    Ernestina Columbiayiesha Kamyrah Feeser, LCSWA [facility] Clinical Social Worker [phone]

## 2016-05-18 NOTE — ED Notes (Signed)
Per Jonnalaggadda, plan is for pt to be d/c'd to home w/rx's for meds and follow up w/[facility].

## 2016-05-18 NOTE — ED Notes (Signed)
Pt on phone w/"brother-in-law" requesting him to come visit at 1230. Gave code [phone].

## 2016-05-19 ENCOUNTER — Emergency Department (HOSPITAL_COMMUNITY)
Admission: EM | Admit: 2016-05-19 | Discharge: 2016-05-19 | Disposition: A | Discharge status: Home / Self Care | Payer: Medicare Other | Attending: Emergency Medicine | Admitting: Emergency Medicine

## 2016-05-19 DIAGNOSIS — R1084 Generalized abdominal pain: Secondary | ICD-10-CM

## 2016-05-19 LAB — COMPREHENSIVE METABOLIC PANEL
ALT: 26 U/L (ref 17–63)
AST: 19 U/L (ref 15–41)
Albumin: 4.1 g/dL (ref 3.5–5.0)
Alkaline Phosphatase: 62 U/L (ref 38–126)
Anion gap: 8 (ref 5–15)
BILIRUBIN TOTAL: 0.8 mg/dL (ref 0.3–1.2)
BUN: 9 mg/dL (ref 6–20)
CO2: 26 mmol/L (ref 22–32)
CREATININE: 1 mg/dL (ref 0.61–1.24)
Calcium: 9.2 mg/dL (ref 8.9–10.3)
Chloride: 101 mmol/L (ref 101–111)
GFR calc Af Amer: >60 mL/min (ref >60)
Glucose, Bld: 96 mg/dL (ref 65–99)
Potassium: 3.2 mmol/L — ABNORMAL LOW (ref 3.5–5.1)
Sodium: 135 mmol/L (ref 135–145)
TOTAL PROTEIN: 7 g/dL (ref 6.5–8.1)

## 2016-05-19 LAB — CBC
HCT: 48.6 % (ref 39.0–52.0)
Hemoglobin: 17 g/dL (ref 13.0–17.0)
MCH: 31.7 pg (ref 26.0–34.0)
MCHC: 35 g/dL (ref 30.0–36.0)
MCV: 90.5 fL (ref 78.0–100.0)
PLATELETS: 264 10*3/uL (ref 150–400)
RBC: 5.37 MIL/uL (ref 4.22–5.81)
RDW: 14.6 % (ref 11.5–15.5)
WBC: 6.2 10*3/uL (ref 4.0–10.5)

## 2016-05-19 LAB — URINE MICROSCOPIC-ADD ON: RBC / HPF: NONE SEEN RBC/hpf (ref 0–5)

## 2016-05-19 LAB — URINALYSIS, ROUTINE W REFLEX MICROSCOPIC
Bilirubin Urine: NEGATIVE
GLUCOSE, UA: NEGATIVE mg/dL
Hgb urine dipstick: NEGATIVE
KETONES UR: NEGATIVE mg/dL
NITRITE: NEGATIVE
PROTEIN: NEGATIVE mg/dL
Specific Gravity, Urine: 1.022 (ref 1.005–1.030)
pH: 6.5 (ref 5.0–8.0)

## 2016-05-19 LAB — LIPASE, BLOOD: Lipase: 47 U/L (ref 11–51)

## 2016-05-19 MED ORDER — POTASSIUM CHLORIDE CRYS ER 20 MEQ PO TBCR
40.0000 meq | EXTENDED_RELEASE_TABLET | Freq: Once | ORAL | Status: AC
  Administered 2016-05-19: 40 meq via ORAL
  Filled 2016-05-19: qty 2

## 2016-05-19 NOTE — ED Provider Notes (Signed)
[facility]-EMERGENCY DEPT Provider Note   CSN: [id number] Arrival date & time: 05/18/16  2330  By signing my name below, I, Rosario AdieWilliam Andrew Hiatt, attest that this documentation has been prepared under the direction and in the presence of Tomasita CrumbleAdeleke Markies Mowatt, MD. Electronically Signed: Rosario AdieWilliam Andrew Hiatt, ED Scribe. 05/19/16. 2:04 AM.  History   Chief Complaint Chief Complaint  Patient presents with  . Abdominal Pain   The history is provided by the patient. No language interpreter was used.    HPI Comments: Paul SalvageJay Blair is a 50 y.o. male with a PMHx of liver cirrhosis, cocaine abuse, and alcohol abuse, who presents to the Emergency Department complaining of gradual onset epigastric and lower abdominal pain onset a few hours PTA. He reports associated bright red bloody stools (x 1 episode) and diarrhea secondary to his pain. Pt additionally notes that [facility], where they told him he had blood in his urine. He notes that he has a hx of diverticulitis, and states that his pain today is similar. Pt further reports that he has been under increased stressed recently, which he thinks may be causing his symptoms. NO alleviating factors noted. Denies constipation, fevers, vomiting, dysuria, or any other associated symptoms.   PCP: none  Past Medical History:  Diagnosis Date  . Cocaine abuse   . Depression   . History of ETOH abuse   . Homicidal ideations   . Hypertension   . Liver cirrhosis (HCC)   . Substance induced mood disorder (HCC)   . Suicidal ideations    Patient Active Problem List   Diagnosis Date Noted  . Alcohol dependence with uncomplicated withdrawal (HCC) 11/06/2014  . Substance induced mood disorder (HCC) 11/06/2014  . Alcohol intoxication (HCC)     History reviewed. No pertinent surgical history.  Home Medications    Prior to Admission medications   Medication Sig Start Date End Date Taking? Authorizing Provider  albuterol (PROVENTIL HFA;VENTOLIN  HFA) 108 (90 Base) MCG/ACT inhaler Inhale 2 puffs into the lungs every 6 (six) hours as needed. 03/15/15   Historical Provider, MD  amLODipine (NORVASC) 10 MG tablet Take 10 mg by mouth daily. 05/11/16 06/10/16  Historical Provider, MD  ARIPiprazole (ABILIFY) 5 MG tablet Take 1 tablet (5 mg total) by mouth daily. 05/18/16 05/28/16  Shaune Pollackameron Isaacs, MD  carvedilol (COREG) 6.25 MG tablet Take 6.25 mg by mouth 2 (two) times daily with a meal.    Historical Provider, MD  cephALEXin (KEFLEX) 500 MG capsule Take 1 capsule (500 mg total) by mouth 3 (three) times daily. 05/18/16 05/25/16  Shaune Pollackameron Isaacs, MD  citalopram (CELEXA) 20 MG tablet Take 1 tablet (20 mg total) by mouth daily. 05/18/16 05/28/16  Shaune Pollackameron Isaacs, MD  cyclobenzaprine (FLEXERIL) 10 MG tablet Take 1 tablet (10 mg total) by mouth 2 (two) times daily as needed for muscle spasms. Patient not taking: Reported on 11/04/2014 07/14/14   Rolland PorterMark James, MD  ibuprofen (ADVIL,MOTRIN) 800 MG tablet Take 1 tablet (800 mg total) by mouth 3 (three) times daily. Patient not taking: Reported on 11/04/2014 02/06/14   Elson AreasLeslie K Sofia, PA-C  traZODone (DESYREL) 150 MG tablet Take 150 mg by mouth at bedtime.    Historical Provider, MD    Family History No family history on file.  Social History Social History  Substance Use Topics  . Smoking status: Current Every Day Smoker  . Smokeless tobacco: Never Used  . Alcohol use Yes     Comment: hx  abuse- incarcerated at present   Allergies   Review of patient's allergies indicates no known allergies.  Review of Systems Review of Systems A complete 10 system review of systems was obtained and all systems are negative except as noted in the HPI and PMH.   Physical Exam Updated Vital Signs BP (!) 158/112   Pulse (!) 59   Temp 97.4 F (36.3 C) (Oral)   Resp 18   SpO2 100%   Physical Exam  Constitutional: He is oriented to person, place, and time. Vital signs are normal. He appears well-developed and well-nourished.   Non-toxic appearance. He does not appear ill. No distress.  HENT:  Head: Normocephalic and atraumatic.  Nose: Nose normal.  Mouth/Throat: Oropharynx is clear and moist. No oropharyngeal exudate.  Eyes: Conjunctivae and EOM are normal. Pupils are equal, round, and reactive to light. No scleral icterus.  Neck: Normal range of motion. Neck supple. No tracheal deviation, no edema, no erythema and normal range of motion present. No thyroid mass and no thyromegaly present.  Cardiovascular: Normal rate, regular rhythm, S1 normal, S2 normal, normal heart sounds, intact distal pulses and normal pulses.  Exam reveals no gallop and no friction rub.   No murmur heard. Pulmonary/Chest: Effort normal and breath sounds normal. No respiratory distress. He has no wheezes. He has no rhonchi. He has no rales.  Abdominal: Soft. Normal appearance and bowel sounds are normal. He exhibits no distension, no ascites and no mass. There is no hepatosplenomegaly. There is no tenderness. There is no rebound, no guarding and no CVA tenderness.  Musculoskeletal: Normal range of motion. He exhibits no edema or tenderness.  Lymphadenopathy:    He has no cervical adenopathy.  Neurological: He is alert and oriented to person, place, and time. He has normal strength. No cranial nerve deficit or sensory deficit.  Skin: Skin is warm, dry and intact. No petechiae and no rash noted. He is not diaphoretic. No erythema. No pallor.  Nursing note and vitals reviewed.  ED Treatments / Results  DIAGNOSTIC STUDIES: Oxygen Saturation is 97% on RA, normal by my interpretation.   COORDINATION OF CARE: 2:01 AM-Discussed next steps with pt. Pt verbalized understanding and is agreeable with the plan.   Labs (all labs ordered are listed, but only abnormal results are displayed) Labs Reviewed  COMPREHENSIVE METABOLIC PANEL - Abnormal; Notable for the following:       Result Value   Potassium 3.2 (*)    All other components within normal  limits  URINALYSIS, ROUTINE W REFLEX MICROSCOPIC (NOT AT [facility]) - Abnormal; Notable for the following:    Leukocytes, UA SMALL (*)    All other components within normal limits  URINE MICROSCOPIC-ADD ON - Abnormal; Notable for the following:    Squamous Epithelial / LPF 0-5 (*)    Bacteria, UA RARE (*)    All other components within normal limits  LIPASE, BLOOD  CBC    EKG  EKG Interpretation None      Radiology No results found.  Procedures Procedures (including critical care time)  Medications Ordered in ED Medications  potassium chloride SA (K-DUR,KLOR-CON) CR tablet 40 mEq (40 mEq Oral Given 05/19/16 0207)     Initial Impression / Assessment and Plan / ED Course  I have reviewed the triage vital signs and the nursing notes.  Pertinent labs & imaging results that were available during my care of the patient were reviewed by me and considered in my medical decision making (see chart for  details).  Clinical Course   Patient presents to the ED for abdominal pain.  PE is unremarkable.  Labs from 3 hours prior are unremarkable and there is no hematuria.  Advised to fu with PCP within 3 days.  He appears well and in NAD. VS remain within his normal limits and he is safe for DC.  Final Clinical Impressions(s) / ED Diagnoses   Final diagnoses:  Generalized abdominal pain    New Prescriptions New Prescriptions   No medications on file     I personally performed the services described in this documentation, which was scribed in my presence. The recorded information has been reviewed and is accurate.       Tomasita Crumble, MD 05/19/16 [phone]

## 2016-05-19 NOTE — ED Notes (Signed)
Pt understood dc material. NAD noted. 

## 2016-05-20 ENCOUNTER — Encounter (HOSPITAL_COMMUNITY): Payer: Self-pay | Admitting: Emergency Medicine

## 2016-05-20 ENCOUNTER — Emergency Department (HOSPITAL_COMMUNITY)
Admission: EM | Admit: 2016-05-20 | Discharge: 2016-05-21 | Disposition: A | Discharge status: Home / Self Care | Payer: Medicare Other | Attending: Emergency Medicine | Admitting: Emergency Medicine

## 2016-05-20 DIAGNOSIS — R45851 Suicidal ideations: Secondary | ICD-10-CM

## 2016-05-20 DIAGNOSIS — Z765 Malingerer [conscious simulation]: Secondary | ICD-10-CM | POA: Insufficient documentation

## 2016-05-20 DIAGNOSIS — F322 Major depressive disorder, single episode, severe without psychotic features: Secondary | ICD-10-CM | POA: Insufficient documentation

## 2016-05-20 DIAGNOSIS — R45850 Homicidal ideations: Secondary | ICD-10-CM | POA: Diagnosis not present

## 2016-05-20 DIAGNOSIS — Z79899 Other long term (current) drug therapy: Secondary | ICD-10-CM | POA: Insufficient documentation

## 2016-05-20 DIAGNOSIS — R440 Auditory hallucinations: Secondary | ICD-10-CM | POA: Diagnosis not present

## 2016-05-20 DIAGNOSIS — F17200 Nicotine dependence, unspecified, uncomplicated: Secondary | ICD-10-CM | POA: Diagnosis not present

## 2016-05-20 DIAGNOSIS — F1994 Other psychoactive substance use, unspecified with psychoactive substance-induced mood disorder: Secondary | ICD-10-CM | POA: Diagnosis present

## 2016-05-20 DIAGNOSIS — F329 Major depressive disorder, single episode, unspecified: Secondary | ICD-10-CM

## 2016-05-20 DIAGNOSIS — I10 Essential (primary) hypertension: Secondary | ICD-10-CM | POA: Diagnosis not present

## 2016-05-20 LAB — COMPREHENSIVE METABOLIC PANEL
ALT: 24 U/L (ref 17–63)
AST: 18 U/L (ref 15–41)
Albumin: 4 g/dL (ref 3.5–5.0)
Alkaline Phosphatase: 63 U/L (ref 38–126)
Anion gap: 5 (ref 5–15)
BILIRUBIN TOTAL: 0.6 mg/dL (ref 0.3–1.2)
BUN: 12 mg/dL (ref 6–20)
CHLORIDE: 107 mmol/L (ref 101–111)
CO2: 28 mmol/L (ref 22–32)
CREATININE: 1.15 mg/dL (ref 0.61–1.24)
Calcium: 9.1 mg/dL (ref 8.9–10.3)
Glucose, Bld: 101 mg/dL — ABNORMAL HIGH (ref 65–99)
POTASSIUM: 3.6 mmol/L (ref 3.5–5.1)
Sodium: 140 mmol/L (ref 135–145)
TOTAL PROTEIN: 6.7 g/dL (ref 6.5–8.1)

## 2016-05-20 LAB — CBC
HCT: 45.6 % (ref 39.0–52.0)
Hemoglobin: 15.6 g/dL (ref 13.0–17.0)
MCH: 31 pg (ref 26.0–34.0)
MCHC: 34.2 g/dL (ref 30.0–36.0)
MCV: 90.7 fL (ref 78.0–100.0)
PLATELETS: 261 10*3/uL (ref 150–400)
RBC: 5.03 MIL/uL (ref 4.22–5.81)
RDW: 15 % (ref 11.5–15.5)
WBC: 6.5 10*3/uL (ref 4.0–10.5)

## 2016-05-20 LAB — RAPID URINE DRUG SCREEN, HOSP PERFORMED
Amphetamines: NOT DETECTED
Barbiturates: NOT DETECTED
Benzodiazepines: NOT DETECTED
Cocaine: NOT DETECTED
OPIATES: NOT DETECTED
Tetrahydrocannabinol: NOT DETECTED

## 2016-05-20 LAB — ACETAMINOPHEN LEVEL: Acetaminophen (Tylenol), Serum: <10 ug/mL — ABNORMAL LOW (ref 10–30)

## 2016-05-20 LAB — SALICYLATE LEVEL

## 2016-05-20 LAB — ETHANOL

## 2016-05-20 NOTE — ED Notes (Signed)
Gave pt a [place] sandwich and a drink

## 2016-05-20 NOTE — ED Triage Notes (Signed)
Pt. reports depression and suicidal ideation today , plans to cut his wrist , denies hallucinations . Charge nurse and staffing office notified for pt.'s sitter , purple scrubs given to pt. , security notified to wand pt.

## 2016-05-21 DIAGNOSIS — F322 Major depressive disorder, single episode, severe without psychotic features: Secondary | ICD-10-CM | POA: Diagnosis not present

## 2016-05-21 MED ORDER — ONDANSETRON HCL 4 MG PO TABS: 4.0000 mg | ORAL_TABLET | Freq: Three times a day (TID) | ORAL | Status: DC | PRN

## 2016-05-21 MED ORDER — AMLODIPINE BESYLATE 5 MG PO TABS
10.0000 mg | ORAL_TABLET | Freq: Every day | ORAL | Status: DC
  Administered 2016-05-21: 10 mg via ORAL
  Filled 2016-05-21: qty 2

## 2016-05-21 MED ORDER — ARIPIPRAZOLE 5 MG PO TABS
5.0000 mg | ORAL_TABLET | Freq: Every day | ORAL | Status: DC
  Administered 2016-05-21: 5 mg via ORAL
  Filled 2016-05-21: qty 1

## 2016-05-21 MED ORDER — CEPHALEXIN 250 MG PO CAPS
500.0000 mg | ORAL_CAPSULE | Freq: Three times a day (TID) | ORAL | Status: DC
  Administered 2016-05-21: 500 mg via ORAL
  Filled 2016-05-21: qty 2

## 2016-05-21 MED ORDER — CITALOPRAM HYDROBROMIDE 10 MG PO TABS
20.0000 mg | ORAL_TABLET | Freq: Every day | ORAL | Status: DC
  Administered 2016-05-21: 20 mg via ORAL
  Filled 2016-05-21: qty 2

## 2016-05-21 MED ORDER — ACETAMINOPHEN 325 MG PO TABS: 650.0000 mg | ORAL_TABLET | ORAL | Status: DC | PRN

## 2016-05-21 MED ORDER — IBUPROFEN 400 MG PO TABS: 600.0000 mg | ORAL_TABLET | Freq: Three times a day (TID) | ORAL | Status: DC | PRN

## 2016-05-21 MED ORDER — CARVEDILOL 12.5 MG PO TABS
6.2500 mg | ORAL_TABLET | Freq: Two times a day (BID) | ORAL | Status: DC
  Administered 2016-05-21: 6.25 mg via ORAL
  Filled 2016-05-21: qty 1

## 2016-05-21 MED ORDER — ALUM & MAG HYDROXIDE-SIMETH 200-200-20 MG/5ML PO SUSP: 30.0000 mL | ORAL | Status: DC | PRN

## 2016-05-21 MED ORDER — LORAZEPAM 1 MG PO TABS: 1.0000 mg | ORAL_TABLET | Freq: Three times a day (TID) | ORAL | Status: DC | PRN

## 2016-05-21 MED ORDER — ALBUTEROL SULFATE HFA 108 (90 BASE) MCG/ACT IN AERS: 1.0000 | INHALATION_SPRAY | Freq: Four times a day (QID) | RESPIRATORY_TRACT | Status: DC | PRN

## 2016-05-21 MED ORDER — DOCUSATE SODIUM 100 MG PO CAPS
100.0000 mg | ORAL_CAPSULE | Freq: Once | ORAL | Status: AC
  Administered 2016-05-21: 100 mg via ORAL
  Filled 2016-05-21: qty 1

## 2016-05-21 MED ORDER — NICOTINE 21 MG/24HR TD PT24: 21.0000 mg | MEDICATED_PATCH | Freq: Every day | TRANSDERMAL | Status: DC

## 2016-05-21 NOTE — ED Notes (Signed)
Pt has been reevaluated by tts.

## 2016-05-21 NOTE — Progress Notes (Signed)
CSW engaged with Patient at his bedside, introduced self, role of CSW, and provided Patient with resources to the [facility] and the [employer]. Patient very appreciative of resources provided. CSW signing off. Please contact if new need(s) arise.          Lance MussAshley Gardner,MSW, LCSW [facility] ED/45M Clinical Social Worker [phone]

## 2016-05-21 NOTE — Consult Note (Signed)
Telepsych Consultation   Reason for Consult: Psych Consultation Referring Physician:  Delsa Grana Patient Identification: Paul Blair MRN:  [id number] Principal Diagnosis: Depression with suicidal ideation Diagnosis:   Patient Active Problem List   Diagnosis Date Noted  . Alcohol dependence with uncomplicated withdrawal ([place]) [F10.230] 11/06/2014  . Substance induced mood disorder ([place]) [F19.94] 11/06/2014  . Alcohol intoxication ([place]) [F10.129]     Total Time spent with patient: 30 minutes  Subjective:   Paul Blair is a 50 y.o. male patient admitted with reports of Auditory hallucinations and Suicidal ideation. Pt was discharged on 05/18/16 '@440p'  from [facility], he returned 05/18/2016 1142p with c/o abdominal pain. He was again discharged on 05/19/2016 at 0241a and returned to the ED on 05/20/2016 at 0818 with SI and depression. Initially reported depression, SI, denied Hallucinations, he reported to TTS that he was hearing voices.   Paul Blair as noted above pt is a poor historian and well known to our ED for malingering behaviors.  Pt reports Im doing better than yesterday. Im going to put my recovery forward and make steps to do something great because I am an exceptional person. [facility] said they would have a bed for me on Friday so I am looking forward to Friday. My card came in the mail today, I tore the old card up so I wouldn't use all my money on drugs. The new card was shipped expressed but Monday was a holiday so it didn't come and that is why I got all bent up yesterday. I talked to my sponsor and he is going to help me out until Friday. Alvin bass [phone]. I been going to meetings 3x a day, when Im not in here. Y'all want see me anymore, I ain't ever coming back to this place. I just want a bus ticket to [place], and if you cant give me a bus ticket can I have two tickets for [place]. I need the address to this [facility] place you talking about. I got something to do this morning so  when you think you gone get me out of here. I need to go talk to my sister I think she is afraid of me, she feeling some kind of way.   HPI:  Paul Blair is an 50 y.o. married male who presents unaccompanied to [facility] ED reporting auditory command hallucinations to harm himself and others. Pt has a history of depression, alcohol abuse, hypertension, cocaine abuse, liver cirrhosis, substance induced mood disorder and he was discharged from [facility] on 05/19/16 after being evaluated by Dr. Mylinda Latina (see note below). Pt says he was at his sister's residence and became very frustrated while on a telephone call with social security. Pt says he began hearing voices telling him to hurt people and to kill himself. He states he called [facility] and spoke to to [employer] sponsor who both recommended Pt come to the [facility] so he would be safe. Pt reports he has thoughts of cutting his wrists and of getting a stick and hitting people with it. He has a history of using cocaine, alcohol and other substances but denies using any alcohol or drugs since 05/15/16; Pt's blood alcohol is less than five and urine drug screen is negative.   Pt is currently homeless and says he stays with his sister during the day "and I come here at night." He has a history of multiple ED visits and was at [facility] ED four times June through August. [facility] documented his baseline as "manipulative and malingering." Pt says he doesn't have an outpatient provider yet but is supposed to follow up with [facility]. Pt reports he is on probation but is working with a Chief Executive Officer because he was placed on probation by mistake.   Pt is dressed in hospital scrubs, alert, oriented x4 with normal speech and normal motor behavior. Eye contact is good. Pt's mood is depressed and affect is congruent with mood. Thought process is coherent and relevant. There is no obvious indication Pt is currently responding to internal stimuli or experiencing delusional  thought content. Pt was cooperative throughout assessment.  NOTE BY DR. Mylinda Latina ON 05/18/16:  "HPI: Paul Blair is a 50 years old male came to the [facility] emergency department from [facility] for seeking help for mental health problems including depression, anxiety, hallucinations, suicidal or homicidal ideation. Patient reported he was not admitted to [facility] so he is mental health recommended to go to [facility]. Patient stated he was diagnosed with Dualpersonality disorder and also depression in the past and received multiple medication for the last 15-20 years without significant difference in his moods. Patient endorses drinking alcohol and also recently using cocaine. Patient is willing to take medications and also seeking substance abuse treatment program at this time. Patient has history of incarcerations at least 27 times since teenager for involving assault charges and currently being on probation. Patient has no distress and reporting he is history without affect attached to his symptoms. Review of the initial assessment completed by T. Fuller Plan, NP and agrees with the findings. Patient appeared to be malingering and highly manipulative with his symptoms and it seems to be for secondary gains."   Past Psychiatric History: polysubstance abuse, Substance induce mood disorder, Homicidal ideations, Alcohol dependence, Depression, malingering   Risk to Self: Suicidal Ideation: Yes-Currently Present Suicidal Intent: Yes-Currently Present Is patient at risk for suicide?: Yes Suicidal Plan?: Yes-Currently Present Specify Current Suicidal Plan: Plan to cut wrists Access to Means: Yes Specify Access to Suicidal Means: Access to sharps What has been your use of drugs/alcohol within the last 12 months?: Pt has history of using cocaine, alcohol How many times?: 0 Other Self Harm Risks: None Triggers for Past Attempts: Hallucinations Intentional Self Injurious  Behavior: None Risk to Others: Homicidal Ideation: Yes-Currently Present Thoughts of Harm to Others: Yes-Currently Present Comment - Thoughts of Harm to Others: Pt reports he hears voices telling him to hurt people Current Homicidal Intent: No Current Homicidal Plan: Yes-Currently Present Describe Current Homicidal Plan: "Pick up a stick and hit people" Access to Homicidal Means: Yes Describe Access to Homicidal Means: Access to things with which to hit people Identified Victim: Unknown History of harm to others?: Yes Assessment of Violence: In past 6-12 months Violent Behavior Description: jailed from Feb - June 2017 for assault Does patient have access to weapons?: No Criminal Charges Pending?: No Does patient have a court date: No Prior Inpatient Therapy: Prior Inpatient Therapy: Yes Prior Therapy Dates: Multiple admits Prior Therapy Facilty/Provider(s): Various facilities Reason for Treatment: Schizophrenia per Pt Prior Outpatient Therapy: Prior Outpatient Therapy: No Prior Therapy Dates: NA Prior Therapy Facilty/Provider(s): NA Reason for Treatment: NA Does patient have an ACCT team?: No Does patient have Intensive In-House Services?  : No Does patient have [facility] services? : No Does patient have P4CC services?: No  Past Medical History:  Past Medical History:  Diagnosis Date  . Cocaine abuse   . Depression   .  History of ETOH abuse   . Homicidal ideations   . Hypertension   . Liver cirrhosis ([place])   . Substance induced mood disorder ([place])   . Suicidal ideations    History reviewed. No pertinent surgical history. Family History: No family history on file. Family Psychiatric  History: None Social History:  History  Alcohol Use  . Yes    Comment: hx abuse- incarcerated at present     History  Drug Use  . Types: Cocaine, Marijuana    Social History   Social History  . Marital status: Married    Spouse name: N/A  . Number of children: N/A  . Years of  education: N/A   Social History Main Topics  . Smoking status: Current Every Day Smoker  . Smokeless tobacco: Never Used  . Alcohol use Yes     Comment: hx abuse- incarcerated at present  . Drug use:     Types: Cocaine, Marijuana  . Sexual activity: Not Asked   Other Topics Concern  . None   Social History Narrative  . None   Additional Social History:    Allergies:  No Known Allergies  Labs:  Results for orders placed or performed during the hospital encounter of 05/20/16 (from the past 48 hour(s))  Rapid urine drug screen (hospital performed)     Status: None   Collection Time: 05/20/16  8:22 PM  Result Value Ref Range   Opiates NONE DETECTED NONE DETECTED   Cocaine NONE DETECTED NONE DETECTED   Benzodiazepines NONE DETECTED NONE DETECTED   Amphetamines NONE DETECTED NONE DETECTED   Tetrahydrocannabinol NONE DETECTED NONE DETECTED   Barbiturates NONE DETECTED NONE DETECTED    Comment:        DRUG SCREEN FOR MEDICAL PURPOSES ONLY.  IF CONFIRMATION IS NEEDED FOR ANY PURPOSE, NOTIFY LAB WITHIN 5 DAYS.        LOWEST DETECTABLE LIMITS FOR URINE DRUG SCREEN Drug Class       Cutoff (ng/mL) Amphetamine      1000 Barbiturate      200 Benzodiazepine   370 Tricyclics       488 Opiates          300 Cocaine          300 THC              50   Comprehensive metabolic panel     Status: Abnormal   Collection Time: 05/20/16  8:27 PM  Result Value Ref Range   Sodium 140 135 - 145 mmol/L   Potassium 3.6 3.5 - 5.1 mmol/L   Chloride 107 101 - 111 mmol/L   CO2 28 22 - 32 mmol/L   Glucose, Bld 101 (H) 65 - 99 mg/dL   BUN 12 6 - 20 mg/dL   Creatinine, Ser 1.15 0.61 - 1.24 mg/dL   Calcium 9.1 8.9 - 10.3 mg/dL   Total Protein 6.7 6.5 - 8.1 g/dL   Albumin 4.0 3.5 - 5.0 g/dL   AST 18 15 - 41 U/L   ALT 24 17 - 63 U/L   Alkaline Phosphatase 63 38 - 126 U/L   Total Bilirubin 0.6 0.3 - 1.2 mg/dL   GFR calc non Af Amer >60 >60 mL/min   GFR calc Af Amer >60 >60 mL/min    Comment:  (NOTE) The eGFR has been calculated using the CKD EPI equation. This calculation has not been validated in all clinical situations. eGFR's persistently <60 mL/min signify possible Chronic Kidney Disease.  Anion gap 5 5 - 15  Ethanol     Status: None   Collection Time: 05/20/16  8:27 PM  Result Value Ref Range   Alcohol, Ethyl (B) <5 <5 mg/dL    Comment:        LOWEST DETECTABLE LIMIT FOR SERUM ALCOHOL IS 5 mg/dL FOR MEDICAL PURPOSES ONLY   Salicylate level     Status: None   Collection Time: 05/20/16  8:27 PM  Result Value Ref Range   Salicylate Lvl <9.7 2.8 - 30.0 mg/dL  Acetaminophen level     Status: Abnormal   Collection Time: 05/20/16  8:27 PM  Result Value Ref Range   Acetaminophen (Tylenol), Serum <10 (L) 10 - 30 ug/mL    Comment:        THERAPEUTIC CONCENTRATIONS VARY SIGNIFICANTLY. A RANGE OF 10-30 ug/mL MAY BE AN EFFECTIVE CONCENTRATION FOR MANY PATIENTS. HOWEVER, SOME ARE BEST TREATED AT CONCENTRATIONS OUTSIDE THIS RANGE. ACETAMINOPHEN CONCENTRATIONS >150 ug/mL AT 4 HOURS AFTER INGESTION AND >50 ug/mL AT 12 HOURS AFTER INGESTION ARE OFTEN ASSOCIATED WITH TOXIC REACTIONS.   cbc     Status: None   Collection Time: 05/20/16  8:27 PM  Result Value Ref Range   WBC 6.5 4.0 - 10.5 K/uL   RBC 5.03 4.22 - 5.81 MIL/uL   Hemoglobin 15.6 13.0 - 17.0 g/dL   HCT 45.6 39.0 - 52.0 %   MCV 90.7 78.0 - 100.0 fL   MCH 31.0 26.0 - 34.0 pg   MCHC 34.2 30.0 - 36.0 g/dL   RDW 15.0 11.5 - 15.5 %   Platelets 261 150 - 400 K/uL    Current Facility-Administered Medications  Medication Dose Route Frequency Provider Last Rate Last Dose  . acetaminophen (TYLENOL) tablet 650 mg  650 mg Oral Q4H PRN Delsa Grana, PA-C      . albuterol (PROVENTIL HFA;VENTOLIN HFA) 108 (90 Base) MCG/ACT inhaler 1-2 puff  1-2 puff Inhalation Q6H PRN Delsa Grana, PA-C      . alum & mag hydroxide-simeth (MAALOX/MYLANTA) 200-200-20 MG/5ML suspension 30 mL  30 mL Oral PRN Delsa Grana, PA-C      .  amLODipine (NORVASC) tablet 10 mg  10 mg Oral Daily Leisa Tapia, PA-C      . ARIPiprazole (ABILIFY) tablet 5 mg  5 mg Oral Daily Leisa Tapia, PA-C      . carvedilol (COREG) tablet 6.25 mg  6.25 mg Oral BID WC Leisa Tapia, PA-C      . cephALEXin (KEFLEX) capsule 500 mg  500 mg Oral TID Delsa Grana, PA-C      . citalopram (CELEXA) tablet 20 mg  20 mg Oral Daily Leisa Tapia, PA-C      . docusate sodium (COLACE) capsule 100 mg  100 mg Oral Once Merryl Hacker, MD      . ibuprofen (ADVIL,MOTRIN) tablet 600 mg  600 mg Oral Q8H PRN Delsa Grana, PA-C      . LORazepam (ATIVAN) tablet 1 mg  1 mg Oral Q8H PRN Delsa Grana, PA-C      . nicotine (NICODERM CQ - dosed in mg/24 hours) patch 21 mg  21 mg Transdermal Daily Leisa Tapia, PA-C      . ondansetron (ZOFRAN) tablet 4 mg  4 mg Oral Q8H PRN Delsa Grana, PA-C       Current Outpatient Prescriptions  Medication Sig Dispense Refill  . albuterol (PROVENTIL HFA;VENTOLIN HFA) 108 (90 Base) MCG/ACT inhaler Inhale 2 puffs into the lungs every 6 (six) hours as  needed.    Marland Kitchen amLODipine (NORVASC) 10 MG tablet Take 10 mg by mouth daily.    . ARIPiprazole (ABILIFY) 5 MG tablet Take 1 tablet (5 mg total) by mouth daily. 10 tablet 0  . carvedilol (COREG) 6.25 MG tablet Take 6.25 mg by mouth 2 (two) times daily with a meal.    . cephALEXin (KEFLEX) 500 MG capsule Take 1 capsule (500 mg total) by mouth 3 (three) times daily. 21 capsule 0  . citalopram (CELEXA) 20 MG tablet Take 1 tablet (20 mg total) by mouth daily. 10 tablet 0  . cyclobenzaprine (FLEXERIL) 10 MG tablet Take 1 tablet (10 mg total) by mouth 2 (two) times daily as needed for muscle spasms. (Patient not taking: Reported on 11/04/2014) 20 tablet 0  . ibuprofen (ADVIL,MOTRIN) 800 MG tablet Take 1 tablet (800 mg total) by mouth 3 (three) times daily. (Patient not taking: Reported on 11/04/2014) 21 tablet 0  . traZODone (DESYREL) 150 MG tablet Take 150 mg by mouth at bedtime.      Musculoskeletal: Strength &  Muscle Tone: within normal limits Gait & Station: normal Patient leans: N/A  Psychiatric Specialty Exam: Physical Exam  ROS  Blood pressure 149/85, pulse 62, temperature 98.3 F (36.8 C), temperature source Oral, resp. rate 16, SpO2 96 %.There is no height or weight on file to calculate BMI.  General Appearance: Fairly Groomed Showered in paper scrubs  Eye Contact:  Good  Speech:  Clear and Coherent and Normal Rate  Volume:  Normal  Mood:  Euthymic  Affect:  Appropriate and Congruent  Thought Process:  Coherent and Goal Directed  Orientation:  Full (Time, Place, and Person)  Thought Content:  WDL  Suicidal Thoughts:  No  Homicidal Thoughts:  No  Memory:  Immediate;   Fair Recent;   Fair  Judgement:  Intact  Insight:  Fair and Present  Psychomotor Activity:  Normal  Concentration:  Concentration: Fair and Attention Span: Fair  Recall:  [employer] of Knowledge:  Fair  Language:  Fair  Akathisia:  No  Handed:  Right  AIMS (if indicated):     Assets:  Communication Skills Desire for Improvement Financial Resources/Insurance Leisure Time Physical Health Social Support Others:  AA sponsor  ADL's:  Intact  Cognition:  WNL  Sleep:      Pt presents alert and oriented, cooperative with no physical complaints. He is able to sit up and relaxes in the ED, he is observed adjusting the camera on the tele-psych machine. He is observed to be in a laughing and joking manner.  Treatment Plan Summary: Daily contact with patient to assess and evaluate symptoms and progress in treatment and Medication management  Disposition: No evidence of imminent risk to self or others at present.   Patient does not meet criteria for psychiatric inpatient admission. Supportive therapy provided about ongoing stressors. Discussed crisis plan, support from social network, calling 911, coming to the Emergency Department, and calling Suicide Hotline. [facility] Path information. he reports being accepted into  [facility] on Friday.   Nanci Pina, FNP 05/21/2016 7:49 AM
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
## 2016-05-21 NOTE — Discharge Instructions (Signed)
Follow-up as per behavioral health. °

## 2016-05-21 NOTE — ED Notes (Signed)
Patient states that he is staying with his sister during the day, states he doesn't want to be there. Inquired that patient has been in ED multiple times throughout the week. Pt made no comment.
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
## 2019-05-18 ENCOUNTER — Telehealth (INDEPENDENT_AMBULATORY_CARE_PROVIDER_SITE_OTHER): Payer: Medicare Other | Admitting: Primary Care

## 2019-08-04 ENCOUNTER — Emergency Department (HOSPITAL_COMMUNITY)
Admission: EM | Admit: 2019-08-04 | Discharge: 2019-08-05 | Disposition: A | Discharge status: Other Acute Inpatient Hospital | Payer: Medicare Other | Attending: Emergency Medicine | Admitting: Emergency Medicine

## 2019-08-04 ENCOUNTER — Other Ambulatory Visit: Payer: Self-pay

## 2019-08-04 DIAGNOSIS — F332 Major depressive disorder, recurrent severe without psychotic features: Secondary | ICD-10-CM | POA: Diagnosis not present

## 2019-08-04 DIAGNOSIS — F17210 Nicotine dependence, cigarettes, uncomplicated: Secondary | ICD-10-CM | POA: Diagnosis not present

## 2019-08-04 DIAGNOSIS — Z79899 Other long term (current) drug therapy: Secondary | ICD-10-CM | POA: Insufficient documentation

## 2019-08-04 DIAGNOSIS — R45851 Suicidal ideations: Secondary | ICD-10-CM

## 2019-08-04 DIAGNOSIS — F329 Major depressive disorder, single episode, unspecified: Secondary | ICD-10-CM | POA: Diagnosis present

## 2019-08-04 DIAGNOSIS — Z20828 Contact with and (suspected) exposure to other viral communicable diseases: Secondary | ICD-10-CM | POA: Insufficient documentation

## 2019-08-04 DIAGNOSIS — F1410 Cocaine abuse, uncomplicated: Secondary | ICD-10-CM | POA: Insufficient documentation

## 2019-08-04 DIAGNOSIS — I10 Essential (primary) hypertension: Secondary | ICD-10-CM | POA: Insufficient documentation

## 2019-08-04 LAB — COMPREHENSIVE METABOLIC PANEL
ALT: 23 U/L (ref 0–44)
AST: 28 U/L (ref 15–41)
Albumin: 4.4 g/dL (ref 3.5–5.0)
Alkaline Phosphatase: 65 U/L (ref 38–126)
Anion gap: 12 (ref 5–15)
BUN: 7 mg/dL (ref 6–20)
CO2: 22 mmol/L (ref 22–32)
Calcium: 8.7 mg/dL — ABNORMAL LOW (ref 8.9–10.3)
Chloride: 104 mmol/L (ref 98–111)
Creatinine, Ser: 1.46 mg/dL — ABNORMAL HIGH (ref 0.61–1.24)
GFR calc Af Amer: >60 mL/min (ref >60)
GFR calc non Af Amer: 53 mL/min — ABNORMAL LOW (ref >60)
Glucose, Bld: 110 mg/dL — ABNORMAL HIGH (ref 70–99)
Potassium: 3 mmol/L — ABNORMAL LOW (ref 3.5–5.1)
Sodium: 138 mmol/L (ref 135–145)
Total Bilirubin: 1 mg/dL (ref 0.3–1.2)
Total Protein: 7.2 g/dL (ref 6.5–8.1)

## 2019-08-04 LAB — ACETAMINOPHEN LEVEL: Acetaminophen (Tylenol), Serum: <10 ug/mL — ABNORMAL LOW (ref 10–30)

## 2019-08-04 LAB — RAPID URINE DRUG SCREEN, HOSP PERFORMED
Amphetamines: NOT DETECTED
Barbiturates: NOT DETECTED
Benzodiazepines: NOT DETECTED
Cocaine: POSITIVE — AB
Opiates: NOT DETECTED
Tetrahydrocannabinol: NOT DETECTED

## 2019-08-04 LAB — SALICYLATE LEVEL: Salicylate Lvl: <7 mg/dL (ref 2.8–30.0)

## 2019-08-04 LAB — CBC
HCT: 48.2 % (ref 39.0–52.0)
Hemoglobin: 16.5 g/dL (ref 13.0–17.0)
MCH: 30.6 pg (ref 26.0–34.0)
MCHC: 34.2 g/dL (ref 30.0–36.0)
MCV: 89.4 fL (ref 80.0–100.0)
Platelets: 307 10*3/uL (ref 150–400)
RBC: 5.39 MIL/uL (ref 4.22–5.81)
RDW: 14.7 % (ref 11.5–15.5)
WBC: 8.9 10*3/uL (ref 4.0–10.5)
nRBC: 0 % (ref 0.0–0.2)

## 2019-08-04 LAB — ETHANOL: Alcohol, Ethyl (B): <10 mg/dL (ref <10)

## 2019-08-04 MED ORDER — ONDANSETRON HCL 4 MG PO TABS: 4.0000 mg | ORAL_TABLET | Freq: Three times a day (TID) | ORAL | Status: DC | PRN

## 2019-08-04 MED ORDER — NICOTINE 21 MG/24HR TD PT24: 21.0000 mg | MEDICATED_PATCH | Freq: Every day | TRANSDERMAL | Status: DC

## 2019-08-04 MED ORDER — ALUM & MAG HYDROXIDE-SIMETH 200-200-20 MG/5ML PO SUSP: 30.0000 mL | Freq: Four times a day (QID) | ORAL | Status: DC | PRN

## 2019-08-04 MED ORDER — ACETAMINOPHEN 325 MG PO TABS: 650.0000 mg | ORAL_TABLET | ORAL | Status: DC | PRN

## 2019-08-04 MED ORDER — ZOLPIDEM TARTRATE 5 MG PO TABS: 5.0000 mg | ORAL_TABLET | Freq: Every evening | ORAL | Status: DC | PRN

## 2019-08-04 NOTE — ED Provider Notes (Signed)
[facility]   CSN: [id number] Arrival date & time: 08/04/19  1922     History   Chief Complaint Chief Complaint  Patient presents with  . Suicidal    HPI Paul SalvageJay Blair is a 53 y.o. male.     The history is provided by the patient and medical records.     53 y.o. F with hx of cocaine abuse, depression, HTN, cirrhosis, presenting to the ED for SI.  Patient reports that he just found out 2 days ago that his daughter passed away.  Reportedly their relationship has been strained for quite some time so they have not been in close contact.  He states he has had thoughts of walking out into traffic to kill himself.  He denies alcohol abuse, has occasionally used cocaine but not in the past 2 days.  Past Medical History:  Diagnosis Date  . Cocaine abuse   . Depression   . History of ETOH abuse   . Homicidal ideations   . Hypertension   . Liver cirrhosis (HCC)   . Substance induced mood disorder (HCC)   . Suicidal ideations     Patient Active Problem List   Diagnosis Date Noted  . Alcohol dependence with uncomplicated withdrawal (HCC) 11/06/2014  . Substance induced mood disorder (HCC) 11/06/2014  . Alcohol intoxication (HCC)   . Depression with suicidal ideation 11/04/2014    No past surgical history on file.      Home Medications    Prior to Admission medications   Medication Sig Start Date End Date Taking? Authorizing Provider  albuterol (PROVENTIL HFA;VENTOLIN HFA) 108 (90 Base) MCG/ACT inhaler Inhale 2 puffs into the lungs every 6 (six) hours as needed. 03/15/15   [provider]  amLODipine (NORVASC) 10 MG tablet Take 10 mg by mouth daily. 05/11/16 06/10/16  [provider]  ARIPiprazole (ABILIFY) 5 MG tablet Take 1 tablet (5 mg total) by mouth daily. 05/18/16 05/28/16  Shaune PollackIsaacs, Cameron, MD  carvedilol (COREG) 6.25 MG tablet Take 6.25 mg by mouth 2 (two) times daily with a meal.    [provider]  citalopram (CELEXA) 20 MG tablet Take 1 tablet (20 mg total) by mouth daily. 05/18/16 05/28/16  Shaune PollackIsaacs, Cameron, MD  cyclobenzaprine (FLEXERIL) 10 MG tablet Take 1 tablet (10 mg total) by mouth 2 (two) times daily as needed for muscle spasms. Patient not taking: Reported on 11/04/2014 07/14/14   Rolland PorterJames, Mark, MD  ibuprofen (ADVIL,MOTRIN) 800 MG tablet Take 1 tablet (800 mg total) by mouth 3 (three) times daily. Patient not taking: Reported on 11/04/2014 02/06/14   Elson AreasSofia, Leslie K, PA-C  traZODone (DESYREL) 150 MG tablet Take 150 mg by mouth at bedtime.    [provider]    Family History No family history on file.  Social History Social History   Tobacco Use  . Smoking status: Current Every Day Smoker  . Smokeless tobacco: Never Used  Substance Use Topics  . Alcohol use: Yes    Comment: hx abuse- incarcerated at present  . Drug use: Yes    Types: Cocaine, Marijuana     Allergies   Patient has no known allergies.   Review of Systems Review of Systems  Psychiatric/Behavioral: Positive for suicidal ideas.  All other systems reviewed and are negative.    Physical Exam Updated Vital Signs BP (!) 144/73 (BP Location: Left Arm)   Pulse 70   Temp 98.4 F (36.9 C) (Oral)   Resp  18   SpO2 96%   Physical Exam Vitals signs and nursing note reviewed.  Constitutional:      Appearance: He is well-developed.  HENT:     Head: Normocephalic and atraumatic.  Eyes:     Conjunctiva/sclera: Conjunctivae normal.     Pupils: Pupils are equal, round, and reactive to light.  Neck:     Musculoskeletal: Normal range of motion.  Cardiovascular:     Rate and Rhythm: Normal rate and regular rhythm.     Heart sounds: Normal heart sounds.  Pulmonary:     Effort: Pulmonary effort is normal.     Breath sounds: Normal breath sounds.  Abdominal:     General: Bowel sounds are normal.     Palpations: Abdomen is soft.  Musculoskeletal: Normal range of motion.  Skin:    General:  Skin is warm and dry.  Neurological:     Mental Status: He is alert and oriented to person, place, and time.  Psychiatric:        Attention and Perception: He does not perceive auditory hallucinations.        Thought Content: Thought content includes suicidal ideation. Thought content does not include homicidal ideation. Thought content includes suicidal plan. Thought content does not include homicidal plan.      ED Treatments / Results  Labs (all labs ordered are listed, but only abnormal results are displayed) Labs Reviewed  COMPREHENSIVE METABOLIC PANEL - Abnormal; Notable for the following components:      Result Value   Potassium 3.0 (*)    Glucose, Bld 110 (*)    Creatinine, Ser 1.46 (*)    Calcium 8.7 (*)    GFR calc non Af Amer 53 (*)    All other components within normal limits  ACETAMINOPHEN LEVEL - Abnormal; Notable for the following components:   Acetaminophen (Tylenol), Serum <10 (*)    All other components within normal limits  RAPID URINE DRUG SCREEN, HOSP PERFORMED - Abnormal; Notable for the following components:   Cocaine POSITIVE (*)    All other components within normal limits  SARS CORONAVIRUS 2 (TAT 6-24 HRS)  ETHANOL  SALICYLATE LEVEL  CBC    EKG None  Radiology No results found.  Procedures Procedures (including critical care time)  Medications Ordered in ED Medications  acetaminophen (TYLENOL) tablet 650 mg (has no administration in time range)  zolpidem (AMBIEN) tablet 5 mg (has no administration in time range)  ondansetron (ZOFRAN) tablet 4 mg (has no administration in time range)  alum & mag hydroxide-simeth (MAALOX/MYLANTA) 200-200-20 MG/5ML suspension 30 mL (has no administration in time range)  nicotine (NICODERM CQ - dosed in mg/24 hours) patch 21 mg (has no administration in time range)  potassium chloride SA (KLOR-CON) CR tablet 40 mEq (40 mEq Oral Given 08/05/19 0242)     Initial Impression / Assessment and Plan / ED Course  I  have reviewed the triage vital signs and the nursing notes.  Pertinent labs & imaging results that were available during my care of the patient were reviewed by me and considered in my medical decision making (see chart for details).  53 y.o. M here with SI.  Recently found out his daughter passed away 2 days ago.  Has plans to run out into traffic.  Appears somewhat depressed here.  Denies HI/AVH.  Labs reassuring aside from mildly low K+ at 3.0, given 40 mEq KDur.  TTS pending.     2:28 AM Discussed with TTS, meets IP criteria  and will accept to [facility] under care of Dr. Jama Flavors after negative COVID swab and oral K+ replacement.  Final Clinical Impressions(s) / ED Diagnoses   Final diagnoses:  Suicidal ideation    ED Discharge Orders    None       Garlon Hatchet, PA-C 08/05/19 0645    Geoffery Lyons, MD 08/05/19 0700

## 2019-08-04 NOTE — ED Triage Notes (Signed)
Patient states that his daughter passed away (2) days ago and just find out today. States he wants to walk in front of a car and let it hit him.

## 2019-08-05 ENCOUNTER — Encounter (HOSPITAL_COMMUNITY): Payer: Self-pay

## 2019-08-05 ENCOUNTER — Inpatient Hospital Stay (HOSPITAL_COMMUNITY)
Admission: AD | Admit: 2019-08-05 | Discharge: 2019-08-13 | DRG: 885 | Disposition: A | Discharge status: Home / Self Care | Payer: Medicare Other | Source: Intra-hospital | Attending: Psychiatry | Admitting: Psychiatry

## 2019-08-05 DIAGNOSIS — E876 Hypokalemia: Secondary | ICD-10-CM | POA: Diagnosis present

## 2019-08-05 DIAGNOSIS — I10 Essential (primary) hypertension: Secondary | ICD-10-CM | POA: Diagnosis present

## 2019-08-05 DIAGNOSIS — M62838 Other muscle spasm: Secondary | ICD-10-CM | POA: Diagnosis present

## 2019-08-05 DIAGNOSIS — J309 Allergic rhinitis, unspecified: Secondary | ICD-10-CM | POA: Diagnosis present

## 2019-08-05 DIAGNOSIS — R45851 Suicidal ideations: Secondary | ICD-10-CM | POA: Diagnosis present

## 2019-08-05 DIAGNOSIS — F333 Major depressive disorder, recurrent, severe with psychotic symptoms: Secondary | ICD-10-CM | POA: Diagnosis present

## 2019-08-05 DIAGNOSIS — F1410 Cocaine abuse, uncomplicated: Secondary | ICD-10-CM | POA: Diagnosis present

## 2019-08-05 DIAGNOSIS — F1420 Cocaine dependence, uncomplicated: Secondary | ICD-10-CM | POA: Diagnosis not present

## 2019-08-05 DIAGNOSIS — F4310 Post-traumatic stress disorder, unspecified: Secondary | ICD-10-CM | POA: Diagnosis present

## 2019-08-05 DIAGNOSIS — F17200 Nicotine dependence, unspecified, uncomplicated: Secondary | ICD-10-CM | POA: Diagnosis present

## 2019-08-05 DIAGNOSIS — G4700 Insomnia, unspecified: Secondary | ICD-10-CM | POA: Diagnosis present

## 2019-08-05 DIAGNOSIS — K219 Gastro-esophageal reflux disease without esophagitis: Secondary | ICD-10-CM | POA: Diagnosis present

## 2019-08-05 DIAGNOSIS — Z20828 Contact with and (suspected) exposure to other viral communicable diseases: Secondary | ICD-10-CM | POA: Diagnosis present

## 2019-08-05 DIAGNOSIS — F332 Major depressive disorder, recurrent severe without psychotic features: Secondary | ICD-10-CM

## 2019-08-05 DIAGNOSIS — N400 Enlarged prostate without lower urinary tract symptoms: Secondary | ICD-10-CM | POA: Diagnosis present

## 2019-08-05 DIAGNOSIS — F430 Acute stress reaction: Secondary | ICD-10-CM | POA: Diagnosis not present

## 2019-08-05 DIAGNOSIS — Z634 Disappearance and death of family member: Secondary | ICD-10-CM | POA: Diagnosis not present

## 2019-08-05 DIAGNOSIS — K7460 Unspecified cirrhosis of liver: Secondary | ICD-10-CM | POA: Diagnosis present

## 2019-08-05 DIAGNOSIS — F322 Major depressive disorder, single episode, severe without psychotic features: Secondary | ICD-10-CM

## 2019-08-05 LAB — SARS CORONAVIRUS 2 (TAT 6-24 HRS): SARS Coronavirus 2: NEGATIVE

## 2019-08-05 MED ORDER — TRAZODONE HCL 50 MG PO TABS
50.0000 mg | ORAL_TABLET | Freq: Every evening | ORAL | Status: DC | PRN
  Administered 2019-08-05 – 2019-08-07 (×3): 50 mg via ORAL
  Filled 2019-08-05 (×3): qty 1

## 2019-08-05 MED ORDER — ACETAMINOPHEN 325 MG PO TABS: 650.0000 mg | ORAL_TABLET | Freq: Four times a day (QID) | ORAL | Status: DC | PRN

## 2019-08-05 MED ORDER — SERTRALINE HCL 25 MG PO TABS
25.0000 mg | ORAL_TABLET | Freq: Every day | ORAL | Status: DC
  Administered 2019-08-05 – 2019-08-07 (×3): 25 mg via ORAL
  Filled 2019-08-05 (×7): qty 1

## 2019-08-05 MED ORDER — NAPROXEN 375 MG PO TABS
375.0000 mg | ORAL_TABLET | Freq: Two times a day (BID) | ORAL | Status: DC | PRN
  Administered 2019-08-05 – 2019-08-11 (×5): 375 mg via ORAL
  Filled 2019-08-05 (×5): qty 1

## 2019-08-05 MED ORDER — FLUTICASONE PROPIONATE 50 MCG/ACT NA SUSP
2.0000 | Freq: Every day | NASAL | Status: DC
  Administered 2019-08-07 – 2019-08-13 (×7): 2 via NASAL
  Filled 2019-08-05 (×2): qty 16

## 2019-08-05 MED ORDER — MAGNESIUM HYDROXIDE 400 MG/5ML PO SUSP: 30.0000 mL | Freq: Every day | ORAL | Status: DC | PRN

## 2019-08-05 MED ORDER — HYDROXYZINE HCL 25 MG PO TABS
25.0000 mg | ORAL_TABLET | Freq: Three times a day (TID) | ORAL | Status: DC | PRN
  Administered 2019-08-05 – 2019-08-07 (×4): 25 mg via ORAL
  Filled 2019-08-05 (×4): qty 1

## 2019-08-05 MED ORDER — POTASSIUM CHLORIDE CRYS ER 20 MEQ PO TBCR
40.0000 meq | EXTENDED_RELEASE_TABLET | Freq: Once | ORAL | Status: AC
  Administered 2019-08-05: 40 meq via ORAL
  Filled 2019-08-05: qty 2

## 2019-08-05 MED ORDER — CYCLOBENZAPRINE HCL 10 MG PO TABS
5.0000 mg | ORAL_TABLET | Freq: Three times a day (TID) | ORAL | Status: DC | PRN
  Administered 2019-08-05 – 2019-08-10 (×6): 5 mg via ORAL
  Filled 2019-08-05 (×6): qty 1

## 2019-08-05 MED ORDER — AMLODIPINE BESYLATE 10 MG PO TABS
10.0000 mg | ORAL_TABLET | Freq: Every day | ORAL | Status: DC
  Administered 2019-08-05 – 2019-08-13 (×9): 10 mg via ORAL
  Filled 2019-08-05 (×3): qty 1
  Filled 2019-08-05: qty 2
  Filled 2019-08-05 (×8): qty 1

## 2019-08-05 MED ORDER — TERAZOSIN HCL 1 MG PO CAPS
1.0000 mg | ORAL_CAPSULE | Freq: Every day | ORAL | Status: DC
  Administered 2019-08-05 – 2019-08-12 (×8): 1 mg via ORAL
  Filled 2019-08-05 (×10): qty 1

## 2019-08-05 MED ORDER — ALUM & MAG HYDROXIDE-SIMETH 200-200-20 MG/5ML PO SUSP: 30.0000 mL | ORAL | Status: DC | PRN
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
## 2019-08-05 NOTE — ED Notes (Signed)
Staffing made aware of need for sitter 

## 2019-08-05 NOTE — ED Notes (Signed)
[facility] transport at ED to transport pt to [facility] Adult unit per MD order. Pt signed voluntarily admission form -whih accompanies pt. Pt signed for personal property and property given to transport for transfer. Personal property obtained from security and transferred with pt. Pt ambulatory off unit.
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
## 2019-08-05 NOTE — ED Notes (Signed)
Lunch Tray Order @ 1012.  
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
## 2019-08-05 NOTE — ED Notes (Signed)
Pt belongings have been inventoried Biochemist, clinical 4).

## 2019-08-05 NOTE — ED Notes (Signed)
Breakfast ordered 

## 2019-08-05 NOTE — Progress Notes (Signed)
Patient ID: Paul Blair, male   DOB: [birth year], 53 y.o.   MRN: [id number]   D: Patient lying in bed on approach tonight. Reports he continues to be depressed and have some SI thoughts on and off. Ordered a blood pressure medication and given sleep med. A: Staff will monitor on q 15 minute checks, follow treatment plan, and give medications as ordered. R: Cooperative on the unit.

## 2019-08-05 NOTE — ED Notes (Signed)
Attempted to call nursing report.  

## 2019-08-05 NOTE — Tx Team (Signed)
Initial Treatment Plan 08/05/2019 2:34 PM Paul Blair [id number]    PATIENT STRESSORS: Financial difficulties Health problems Substance abuse   PATIENT STRENGTHS: Ability for insight Average or above average intelligence Communication skills   PATIENT IDENTIFIED PROBLEMS: "medication management"  Depression  Suicidal ideation                 DISCHARGE CRITERIA:  Ability to meet basic life and health needs Adequate post-discharge living arrangements Motivation to continue treatment in a less acute level of care  PRELIMINARY DISCHARGE PLAN: Attend aftercare/continuing care group Outpatient therapy Return to previous living arrangement  PATIENT/FAMILY INVOLVEMENT: This treatment plan has been presented to and reviewed with the patient, Paul Blair, and/or family member.  The patient and family have been given the opportunity to ask questions and make suggestions.  Vela Prose, RN 08/05/2019, 2:34 PM
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
## 2019-08-05 NOTE — BH Assessment (Signed)
[facility] Assessment Progress Note   Clinician did talk  With Rachael Fee, PA about patient coming to [facility].  She is ordering a COVID 19 test and 40 of potassium for patient.  Pending a negative COVID patient could come to [facility] [id number] to Dr. Parke Poisson.

## 2019-08-05 NOTE — Progress Notes (Signed)
Admission Note: Patient is a 53 year old male admitted to the unit for depression and suicidal ideation with plan to walk into traffic.  Patient is alert and oriented x 4.  Presents with anxious affect and mood.  Admission plan of care reviewed and consent signed.  States he relapsed and used cocaine due to  grieving the lost of his daughter.  States goal is medication management and resources.  Skin assessment and personal belongings completed.  Skin is dry and intact.  No contraband found.  Patient oriented to the unit, staff and room.  Routine safety checks initiated.  Verbalizes understanding of unit rules and protocols.  Patient is safe on the unit.

## 2019-08-06 DIAGNOSIS — F332 Major depressive disorder, recurrent severe without psychotic features: Secondary | ICD-10-CM

## 2019-08-06 DIAGNOSIS — F322 Major depressive disorder, single episode, severe without psychotic features: Secondary | ICD-10-CM

## 2019-08-06 NOTE — BHH Group Notes (Signed)
LCSW Group Therapy Note  08/06/2019    10:00-11:00am   Type of Therapy and Topic:  Group Therapy: Communication (Passive, Assertive, and Aggressive)  Participation Level:  Active   Description of Group:   In this group, patients learned about the diffferences between passive, assertive, and aggressive communication.  Examples were given and patients were asked to explore what their typical style of communicating might be.  The way in which communication style is linked to anger was also discussed.  Patients talked about their life journeys of getting to know themselves.  CSW briefly provided education about the Dwaine Gale stages of development and that looking at one's purpose in life is a natural human task.  Therapeutic Goals: 1. Patients will identify their typical communication style 2. Patients will discuss how their communication style works for them and against them. 3. Patients will explore the link between their communication style and their anger 4. Patients will learn that communication can always be improved and can benefit not only the relationship they have with others, but also the relationship they have with themselves.  Summary of Patient Progress:  The patient did not wish to talk about his communication style, saying he just lost his 3rd child and is more focused on that.  He was monopolizing and philosophical about the "real" spiritual world versus the "fake" material world throughout most of group.  Therapeutic Modalities:   Processing Education  Maretta Los  .
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
## 2019-08-06 NOTE — Progress Notes (Signed)
[facility] NOVEL CORONAVIRUS (COVID-19) DAILY CHECK-OFF SYMPTOMS - answer yes or no to each - every day NO YES  Have you had a fever in the past 24 hours?  . Fever (Temp > 37.80C / 100F) X   Have you had any of these symptoms in the past 24 hours? . New Cough .  Sore Throat  .  Shortness of Breath .  Difficulty Breathing .  Unexplained Body Aches   X   Have you had any one of these symptoms in the past 24 hours not related to allergies?   . Runny Nose .  Nasal Congestion .  Sneezing   X   If you have had runny nose, nasal congestion, sneezing in the past 24 hours, has it worsened?  X   EXPOSURES - check yes or no X   Have you traveled outside the state in the past 14 days?  X   Have you been in contact with someone with a confirmed diagnosis of COVID-19 or PUI in the past 14 days without wearing appropriate PPE?  X   Have you been living in the same home as a person with confirmed diagnosis of COVID-19 or a PUI (household contact)?    X   Have you been diagnosed with COVID-19?    X              What to do next: Answered NO to all: Answered YES to anything:   Proceed with unit schedule Follow the [facility] Inpatient Flowsheet.   

## 2019-08-06 NOTE — Progress Notes (Signed)
[facility] MD Progress Note  08/06/2019 12:58 PM Paul Blair  MRN:  [id number]  Subjective: Paul Blair reports, "I'm not feeling too good today. I really don't know how to start expressing what happened to me & how I'm feeling about it. My 6 old daughter just passed. I still did not have the got to accept it or inquire the details on the cause of her death. This is the third time I'm losing a child. I feel lost. Confused, sad, overwhelmed with grief & angry. That is the most I can right now to say the list. I'm devastated".  Objective: Patient is a 53 year old male with a past psychiatric history significant for probable posttraumatic stress disorder, cocaine use disorder, depression, hypertension and cirrhosis who presented to the [facility] emergency department on 08/05/2019 with suicidal ideation. The patient stated that approximately 3 days ago he was notified by telephone that his daughter had passed. She was 69 years of age. He was unsure what the cause of death was. The patient admitted that he had also lost 2 other children in the past. He is involved with the [facility]" program that is involved with the [facility]. He has had 3-1/2 years of sobriety. 08-06-19, Paul Blair is seen, chart reviewed. The chart findings discussed with the treatment team. He is seen in his room. He presents alert & oriented x 4. He is making good eye contact & verbally responsive. He says he is feeling distraught about the recent death of his 44 year old daughter. He says he could not get himself to accept the fact that she is gone & unable to make any sense of the event. He says he is feeling enormous grief, lost, confused, sadness, overwhelmed & angry. He is however, encouraged to come out of his room & attend group sessions to help him learn coping skills. He is taking & tolerating his treatment regimen. He is unable to endorse or deny any SI. He is able to verbally contract for safety. He currently denies  any HI, AVH, delusional thoughts or paranoia. He does not appear to be responding to any internal stimuli. Paul Blair is agreement to continue current plan of care as already in progress.  Principal Problem: Major depressive disorder, recurrent episode, severe (HCC)  Diagnosis: Principal Problem:   Major depressive disorder, recurrent episode, severe (HCC) Active Problems:   MDD (major depressive disorder), recurrent, severe, with psychosis (HCC)   Major depressive disorder, single episode, severe (HCC)  Total Time spent with patient: 25 minutes  Past Psychiatric History: See H&P  Past Medical History:  Past Medical History:  Diagnosis Date  . Cocaine abuse (HCC)   . Depression   . History of ETOH abuse   . Homicidal ideations   . Hypertension   . Liver cirrhosis (HCC)   . Substance induced mood disorder (HCC)   . Suicidal ideations    History reviewed. No pertinent surgical history.  Family History: History reviewed. No pertinent family history.  Family Psychiatric  History: See H&P  Social History:  Social History   Substance and Sexual Activity  Alcohol Use Yes   Comment: hx abuse- incarcerated at present     Social History   Substance and Sexual Activity  Drug Use Yes  . Types: Cocaine, Marijuana    Social History   Socioeconomic History  . Marital status: Divorced    Spouse name: Not on file  . Number of children: Not on file  . Years of education: Not on  file  . Highest education level: Not on file  Occupational History  . Not on file  Social Needs  . Financial resource strain: Not on file  . Food insecurity    Worry: Not on file    Inability: Not on file  . Transportation needs    Medical: Not on file    Non-medical: Not on file  Tobacco Use  . Smoking status: Current Every Day Smoker  . Smokeless tobacco: Never Used  Substance and Sexual Activity  . Alcohol use: Yes    Comment: hx abuse- incarcerated at present  . Drug use: Yes    Types: Cocaine,  Marijuana  . Sexual activity: Not on file  Lifestyle  . Physical activity    Days per week: Not on file    Minutes per session: Not on file  . Stress: Not on file  Relationships  . Social Musician on phone: Not on file    Gets together: Not on file    Attends religious service: Not on file    Active member of club or organization: Not on file    Attends meetings of clubs or organizations: Not on file    Relationship status: Not on file  Other Topics Concern  . Not on file  Social History Narrative  . Not on file   Additional Social History:   Sleep: Good  Appetite:  Good  Current Medications: Current Facility-Administered Medications  Medication Dose Route Frequency Provider Last Rate Last Dose  . acetaminophen (TYLENOL) tablet 650 mg  650 mg Oral Q6H PRN Antonieta Pert, MD      . alum & mag hydroxide-simeth (MAALOX/MYLANTA) 200-200-20 MG/5ML suspension 30 mL  30 mL Oral Q4H PRN Antonieta Pert, MD      . amLODipine (NORVASC) tablet 10 mg  10 mg Oral Daily Antonieta Pert, MD   10 mg at 08/06/19 0834  . cyclobenzaprine (FLEXERIL) tablet 5 mg  5 mg Oral TID PRN Antonieta Pert, MD   5 mg at 08/05/19 1446  . fluticasone (FLONASE) 50 MCG/ACT nasal spray 2 spray  2 spray Each Nare Daily Antonieta Pert, MD      . hydrOXYzine (ATARAX/VISTARIL) tablet 25 mg  25 mg Oral TID PRN Antonieta Pert, MD   25 mg at 08/05/19 1446  . magnesium hydroxide (MILK OF MAGNESIA) suspension 30 mL  30 mL Oral Daily PRN Antonieta Pert, MD      . naproxen (NAPROSYN) tablet 375 mg  375 mg Oral BID PRN Antonieta Pert, MD   375 mg at 08/05/19 1446  . sertraline (ZOLOFT) tablet 25 mg  25 mg Oral Daily Antonieta Pert, MD   25 mg at 08/06/19 0834  . terazosin (HYTRIN) capsule 1 mg  1 mg Oral QHS Antonieta Pert, MD   1 mg at 08/05/19 2122  . traZODone (DESYREL) tablet 50 mg  50 mg Oral QHS PRN Antonieta Pert, MD   50 mg at 08/05/19 2122   Lab Results:   Results for orders placed or performed during the hospital encounter of 08/04/19 (from the past 48 hour(s))  Comprehensive metabolic panel     Status: Abnormal   Collection Time: 08/04/19  7:43 PM  Result Value Ref Range   Sodium 138 135 - 145 mmol/L   Potassium 3.0 (L) 3.5 - 5.1 mmol/L   Chloride 104 98 - 111 mmol/L   CO2 22 22 - 32 mmol/L  Glucose, Bld 110 (H) 70 - 99 mg/dL   BUN 7 6 - 20 mg/dL   Creatinine, Ser 1.46 (H) 0.61 - 1.24 mg/dL   Calcium 8.7 (L) 8.9 - 10.3 mg/dL   Total Protein 7.2 6.5 - 8.1 g/dL   Albumin 4.4 3.5 - 5.0 g/dL   AST 28 15 - 41 U/L   ALT 23 0 - 44 U/L   Alkaline Phosphatase 65 38 - 126 U/L   Total Bilirubin 1.0 0.3 - 1.2 mg/dL   GFR calc non Af Amer 53 (L) >60 mL/min   GFR calc Af Amer >60 >60 mL/min   Anion gap 12 5 - 15    Comment: Performed at [facility] [address] [place], [place] [address]  Ethanol     Status: None   Collection Time: 08/04/19  7:43 PM  Result Value Ref Range   Alcohol, Ethyl (B) <10 <10 mg/dL    Comment: (NOTE) Lowest detectable limit for serum alcohol is 10 mg/dL. For medical purposes only. Performed at [facility], [place] [address] [place], [place] [address]   Salicylate level     Status: None   Collection Time: 08/04/19  7:43 PM  Result Value Ref Range   Salicylate Lvl <7.8 2.8 - 30.0 mg/dL    Comment: Performed at [facility] [address] [place], [place] [address]  Acetaminophen level     Status: Abnormal   Collection Time: 08/04/19  7:43 PM  Result Value Ref Range   Acetaminophen (Tylenol), Serum <10 (L) 10 - 30 ug/mL    Comment: (NOTE) Therapeutic concentrations vary significantly. A range of 10-30 ug/mL  may be an effective concentration for many patients. However, some  are best treated at concentrations outside of this range. Acetaminophen concentrations >150 ug/mL at 4 hours after ingestion  and >50 ug/mL at 12 hours after ingestion are often associated with  toxic  reactions. Performed at [facility], [place] [address] [place], [place] [address]   cbc     Status: None   Collection Time: 08/04/19  7:43 PM  Result Value Ref Range   WBC 8.9 4.0 - 10.5 K/uL   RBC 5.39 4.22 - 5.81 MIL/uL   Hemoglobin 16.5 13.0 - 17.0 g/dL   HCT 48.2 39.0 - 52.0 %   MCV 89.4 80.0 - 100.0 fL   MCH 30.6 26.0 - 34.0 pg   MCHC 34.2 30.0 - 36.0 g/dL   RDW 14.7 11.5 - 15.5 %   Platelets 307 150 - 400 K/uL   nRBC 0.0 0.0 - 0.2 %    Comment: Performed at [facility], [place] [address] [place], [place] [address]  Rapid urine drug screen (hospital performed)     Status: Abnormal   Collection Time: 08/04/19  7:45 PM  Result Value Ref Range   Opiates NONE DETECTED NONE DETECTED   Cocaine POSITIVE (A) NONE DETECTED   Benzodiazepines NONE DETECTED NONE DETECTED   Amphetamines NONE DETECTED NONE DETECTED   Tetrahydrocannabinol NONE DETECTED NONE DETECTED   Barbiturates NONE DETECTED NONE DETECTED    Comment: (NOTE) DRUG SCREEN FOR MEDICAL PURPOSES ONLY.  IF CONFIRMATION IS NEEDED FOR ANY PURPOSE, NOTIFY LAB WITHIN 5 DAYS. LOWEST DETECTABLE LIMITS FOR URINE DRUG SCREEN Drug Class                     Cutoff (ng/mL) Amphetamine and metabolites    1000 Barbiturate and metabolites  200 Benzodiazepine                 200 Tricyclics and metabolites     300 Opiates and metabolites        300 Cocaine and metabolites        300 THC                            50 Performed at [facility], [address], [place], [place] [address]   SARS CORONAVIRUS 2 (TAT 6-24 HRS) Nasopharyngeal Nasopharyngeal Swab     Status: None   Collection Time: 08/05/19  2:48 AM   Specimen: Nasopharyngeal Swab  Result Value Ref Range   SARS Coronavirus 2 NEGATIVE NEGATIVE    Comment: (NOTE) SARS-CoV-2 target nucleic acids are NOT DETECTED. The SARS-CoV-2 RNA is generally detectable in upper and lower respiratory specimens during the acute phase of infection. Negative results do  not preclude SARS-CoV-2 infection, do not rule out co-infections with other pathogens, and should not be used as the sole basis for treatment or other patient management decisions. Negative results must be combined with clinical observations, patient history, and epidemiological information. The expected result is Negative. Fact Sheet for Patients: HairSlick.no Fact Sheet for Healthcare Providers: quierodirigir.com This test is not yet approved or cleared by the [place] FDA and  has been authorized for detection and/or diagnosis of SARS-CoV-2 by FDA under an Emergency Use Authorization (EUA). This EUA will remain  in effect (meaning this test can be used) for the duration of the COVID-19 declaration under Section 56 4(b)(1) of the Act, 21 U.S.C. section 360bbb-3(b)(1), unless the authorization is terminated or revoked sooner. Performed at [facility], [address] [place], [place] [address]    Blood Alcohol level:  Lab Results  Component Value Date   ETH <10 08/04/2019   ETH <5 05/20/2016   Metabolic Disorder Labs: No results found for: HGBA1C, MPG No results found for: PROLACTIN No results found for: CHOL, TRIG, HDL, CHOLHDL, VLDL, LDLCALC  Physical Findings: AIMS: Facial and Oral Movements Muscles of Facial Expression: None, normal Lips and Perioral Area: None, normal Jaw: None, normal Tongue: None, normal,Extremity Movements Upper (arms, wrists, hands, fingers): None, normal Lower (legs, knees, ankles, toes): None, normal, Trunk Movements Neck, shoulders, hips: None, normal, Overall Severity Severity of abnormal movements (highest score from questions above): None, normal Incapacitation due to abnormal movements: None, normal Patient's awareness of abnormal movements (rate only patient's report): No Awareness, Dental Status Current problems with teeth and/or dentures?: No Does patient usually wear  dentures?: No  CIWA:    COWS:     Musculoskeletal: Strength & Muscle Tone: within normal limits Gait & Station: normal Patient leans: N/A  Psychiatric Specialty Exam: Physical Exam  Nursing note and vitals reviewed. Constitutional: He is oriented to person, place, and time.  Neck: Normal range of motion.  Cardiovascular:  Elevated blood pressure:131/97. Patient is currently not in any apparent distress.  Respiratory: Effort normal.  Genitourinary:    Genitourinary Comments: Deferred   Musculoskeletal: Normal range of motion.  Neurological: He is alert and oriented to person, place, and time.  Skin: Skin is warm and dry.    Review of Systems  Constitutional: Negative for chills and fever.  Respiratory: Negative for cough, shortness of breath and wheezing.   Cardiovascular: Negative for chest pain and palpitations.  Gastrointestinal: Negative for abdominal pain, heartburn, nausea and vomiting.  Neurological:  Negative for dizziness and headaches.  Psychiatric/Behavioral: Positive for depression and substance abuse (Hx. Cocaine use disorder). Negative for hallucinations, memory loss and suicidal ideas. The patient is nervous/anxious. The patient does not have insomnia.     Blood pressure (!) 131/97, pulse 92, temperature 98 F (36.7 C), temperature source Oral, resp. rate 20, height 6\' 1"  (1.854 m), weight 111.6 kg, SpO2 98 %.Body mass index is 32.46 kg/m.  General Appearance: Casual  Eye Contact:  Fair  Speech:  Normal Rate  Volume:  Normal  Mood:  Anxious and Depressed  Affect:  Tearful  Thought Process:  Coherent and Descriptions of Associations: Intact  Orientation:  Full (Time, Place, and Person)  Thought Content:  Logical, ruminations.  Suicidal Thoughts:  Yes.  without intent/plan  Homicidal Thoughts:  No  Memory:  Immediate;   Fair Recent;   Fair Remote;   Fair  Judgement:  Intact  Insight:  Fair  Psychomotor Activity:  Increased  Concentration:  Concentration:  Fair and Attention Span: Fair  Recall:  [employer]  Fund of Knowledge:  Good  Language:  Good  Akathisia:  Negative  Handed:  Right  AIMS (if indicated):     Assets:  Communication Skills Desire for Improvement Resilience Social Support Talents/Skills  ADL's:  Intact  Cognition:  WNL    Sleep:  Number of Hours: 6.75   Treatment Plan Summary: Daily contact with patient to assess and evaluate symptoms and progress in treatment and Medication management.  -Continue inpatient hospitalization.  -Will continue today 08/06/2019 plan as below except where it is noted.  -Depression   -Continue Zoloft 25 mg po qDay  -Anxiety  -Continue atarax 25 mg po q8h prn anxiety  -Insomnia  -Continue Trazodone 50 mg po prn qhs  -Allergies              -Continue Flonase 50 MCG/ACT 2 sprays each nare daily.   -HTN  -Continue amLoDipine 10 mg po q Day   -Continue Terazosin 1 mg po qDay.  Pain             -Continue naproxen 375 mg po prn bid.  -Encourage participation in groups and therapeutic milieu  -Disposition planning will be ongoing  Armandina StammerAgnes Murlene Revell, NP, PMHNP, FNP-BC 08/06/2019, 12:58 PM

## 2019-08-06 NOTE — Progress Notes (Signed)
[facility] Group Notes:  (Nursing/MHT/Case Management/Adjunct)  Date:  08/06/2019  Time:  2030  Type of Therapy:  wrap up group  Participation Level:  Minimal  Participation Quality:  Attentive and Drowsy  Affect:  Blunted  Cognitive:  Appropriate  Insight:  Lacking  Engagement in Group:  Limited  Modes of Intervention:  Clarification, Education and Support  Summary of Progress/Problems:  Shellia Cleverly 08/06/2019, 10:01 PM

## 2019-08-06 NOTE — Progress Notes (Addendum)
   08/06/19 1200  Psych Admission Type (Psych Patients Only)  Admission Status Voluntary  Psychosocial Assessment  Patient Complaints Depression  Eye Contact Brief  Facial Expression Flat  Affect Appropriate to circumstance  Speech Logical/coherent  Interaction Minimal  Motor Activity Slow  Appearance/Hygiene Unremarkable  Behavior Characteristics Cooperative  Mood Depressed;Anxious  Thought Process  Coherency WDL  Content WDL  Delusions WDL  Perception WDL  Hallucination None reported or observed  Judgment WDL  Confusion WDL  Danger to Self  Current suicidal ideation? Passive  Self-Injurious Behavior No self-injurious ideation or behavior indicators observed or expressed   Agreement Not to Harm Self Yes  Description of Agreement contracts in hospital  Danger to Others  Danger to Others None reported or observed

## 2019-08-06 NOTE — [facility] Suicide Risk Assessment (Signed)
[facility] INPATIENT:  Family/Significant Other Suicide Prevention Education  Suicide Prevention Education:  Patient Refusal for Family/Significant Other Suicide Prevention Education: The patient Paul Blair has refused to provide written consent for family/significant other to be provided Family/Significant Other Suicide Prevention Education during admission and/or prior to discharge.  Physician notified.  Berlin Hun Grossman-Orr 08/06/2019, 4:39 PM

## 2019-08-07 LAB — BASIC METABOLIC PANEL
Anion gap: 8 (ref 5–15)
BUN: 14 mg/dL (ref 6–20)
CO2: 25 mmol/L (ref 22–32)
Calcium: 8.3 mg/dL — ABNORMAL LOW (ref 8.9–10.3)
Chloride: 107 mmol/L (ref 98–111)
Creatinine, Ser: 1.04 mg/dL (ref 0.61–1.24)
GFR calc Af Amer: >60 mL/min (ref >60)
GFR calc non Af Amer: >60 mL/min (ref >60)
Glucose, Bld: 94 mg/dL (ref 70–99)
Potassium: 3.3 mmol/L — ABNORMAL LOW (ref 3.5–5.1)
Sodium: 140 mmol/L (ref 135–145)

## 2019-08-07 LAB — TSH: TSH: 2.18 u[IU]/mL (ref 0.350–4.500)

## 2019-08-07 MED ORDER — RISPERIDONE 1 MG PO TABS
1.0000 mg | ORAL_TABLET | Freq: Every day | ORAL | Status: DC
  Administered 2019-08-07: 1 mg via ORAL
  Filled 2019-08-07 (×3): qty 1

## 2019-08-07 MED ORDER — SERTRALINE HCL 50 MG PO TABS
50.0000 mg | ORAL_TABLET | Freq: Every day | ORAL | Status: DC
  Administered 2019-08-08: 50 mg via ORAL
  Filled 2019-08-07 (×3): qty 1

## 2019-08-07 MED ORDER — CALCIUM CARBONATE ANTACID 500 MG PO CHEW: 200.0000 mg | CHEWABLE_TABLET | Freq: Three times a day (TID) | ORAL | Status: DC | PRN
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
## 2019-08-07 NOTE — Progress Notes (Signed)
   08/07/19 1300  Psych Admission Type (Psych Patients Only)  Admission Status Voluntary  Psychosocial Assessment  Patient Complaints Depression  Eye Contact Brief  Facial Expression Flat  Affect Appropriate to circumstance  Speech Logical/coherent  Interaction Minimal  Motor Activity Other (Comment) (WDL)  Appearance/Hygiene Unremarkable  Behavior Characteristics Cooperative  Mood Depressed;Pleasant  Aggressive Behavior  Effect No apparent injury  Thought Process  Coherency WDL  Content WDL  Delusions None reported or observed  Perception WDL  Hallucination None reported or observed  Judgment WDL  Confusion None  Danger to Self  Current suicidal ideation? Denies  Self-Injurious Behavior No self-injurious ideation or behavior indicators observed or expressed   Agreement Not to Harm Self Yes  Description of Agreement contracts in hospital  Danger to Others  Danger to Others None reported or observed

## 2019-08-07 NOTE — Progress Notes (Signed)
[place] NOVEL CORONAVIRUS (COVID-19) DAILY CHECK-OFF SYMPTOMS - answer yes or no to each - every day NO YES  Have you had a fever in the past 24 hours?  . Fever (Temp > 37.80C / 100F) X   Have you had any of these symptoms in the past 24 hours? . New Cough .  Sore Throat  .  Shortness of Breath .  Difficulty Breathing .  Unexplained Body Aches   X   Have you had any one of these symptoms in the past 24 hours not related to allergies?   . Runny Nose .  Nasal Congestion .  Sneezing   X   If you have had runny nose, nasal congestion, sneezing in the past 24 hours, has it worsened?  X   EXPOSURES - check yes or no X   Have you traveled outside the state in the past 14 days?  X   Have you been in contact with someone with a confirmed diagnosis of COVID-19 or PUI in the past 14 days without wearing appropriate PPE?  X   Have you been living in the same home as a person with confirmed diagnosis of COVID-19 or a PUI (household contact)?    X   Have you been diagnosed with COVID-19?    X              What to do next: Answered NO to all: Answered YES to anything:   Proceed with unit schedule Follow the [facility] Inpatient Flowsheet.   

## 2019-08-08 MED ORDER — MIRTAZAPINE 7.5 MG PO TABS
7.5000 mg | ORAL_TABLET | Freq: Every day | ORAL | Status: DC
  Filled 2019-08-08 (×2): qty 1

## 2019-08-08 MED ORDER — DULOXETINE HCL 30 MG PO CPEP
30.0000 mg | ORAL_CAPSULE | Freq: Every day | ORAL | Status: DC
  Administered 2019-08-09 – 2019-08-10 (×2): 30 mg via ORAL
  Filled 2019-08-08 (×3): qty 1

## 2019-08-08 MED ORDER — POTASSIUM CHLORIDE CRYS ER 10 MEQ PO TBCR
10.0000 meq | EXTENDED_RELEASE_TABLET | Freq: Two times a day (BID) | ORAL | Status: AC
  Administered 2019-08-08 – 2019-08-09 (×3): 10 meq via ORAL
  Filled 2019-08-08 (×3): qty 1

## 2019-08-08 MED ORDER — PANTOPRAZOLE SODIUM 40 MG PO TBEC
40.0000 mg | DELAYED_RELEASE_TABLET | Freq: Every day | ORAL | Status: DC
  Administered 2019-08-08 – 2019-08-13 (×6): 40 mg via ORAL
  Filled 2019-08-08 (×9): qty 1

## 2019-08-08 NOTE — Progress Notes (Addendum)
Spiritual care group on grief and loss facilitated by chaplain Jerene Pitch MDiv, [facility]  Group Goal:  Support / Education around grief and loss Members engage in facilitated group support and psycho-social education.  Group Description:  Following introductions and group rules, group members engaged in facilitated group dialog and support around topic of loss, with particular support around experiences of loss in their lives. Group Identified types of loss (relationships / self / things) and identified patterns, circumstances, and changes that precipitate losses. Reflected on thoughts / feelings around loss, normalized grief responses, and recognized variety in grief experience.   Group noted [facility]'s four tasks of grief in discussion.  Group drew on Adlerian / Rogerian, narrative, MI, Patient Progress: Paul Blair was present throughout group. Received support from group members around recent death of his daughter.  Paul Blair spoke about how grieving his daughter's death is helping him clarify what is important to him.  Noted feeling some shame with self around chasing things he now feels are frivolous.  Spoke with another group member about finding grace for self and challenge of living more in line with what he values.  He noted he wishes to have closer connection to those he cares for in his life - especially other children.

## 2019-08-08 NOTE — Progress Notes (Addendum)
[facility] MD Progress Note  08/08/2019 1:41 PM Paul Blair  MRN:  [id number]  Subjective: Patient endorses some improvement but states that overall he remains depressed and describes some persistent suicidal ideations although without plan or intention at this time.  He states "I do not feel the medications are working yet". Denies medication side effects.  Objective: I have reviewed chart notes and have met with patient. 53 year old male, presented on 11/20 describing depression, suicidal ideations of walking into traffic, in the context of recent death of his 81 year old daughter from unknown causes.  He reports he also lost 2 other of his children in the past.  Reports a history of cocaine use disorder, had been sober for more than a year, relapsed recently after finding out that his daughter had passed.  Endorses a history of 1 prior psychiatric admission years ago for depression. Today patient presents alert, attentive, calm, polite on approach.  Describes still feeling depressed and endorses some intermittent passive SI although contracts for safety on unit.  Denies hallucinations and does not appear internally preoccupied. He is currently on Zoloft and low-dose Risperidone.  Although denies side effects, states that he believes he was on sertraline in the past and it caused some sexual dysfunction, due to which he wants to try another antidepressant. Behavior on unit in good control, visible in dayroom, going to some groups, interacting with peers. Labs reviewed-BMP unremarkable (potassium slightly low at 3.3.  Serum glucose 94, TSH 2.18  Principal Problem: Major depressive disorder, recurrent episode, severe (HCC)  Diagnosis: Principal Problem:   Major depressive disorder, recurrent episode, severe (HCC) Active Problems:   MDD (major depressive disorder), recurrent, severe, with psychosis ([place])   Major depressive disorder, single episode, severe ([place])  Total Time spent with patient: 25  minutes  Past Psychiatric History: See H&P  Past Medical History:  Past Medical History:  Diagnosis Date  . Cocaine abuse ([place])   . Depression   . History of ETOH abuse   . Homicidal ideations   . Hypertension   . Liver cirrhosis ([place])   . Substance induced mood disorder ([place])   . Suicidal ideations    History reviewed. No pertinent surgical history.  Family History: History reviewed. No pertinent family history.  Family Psychiatric  History: See H&P  Social History:  Social History   Substance and Sexual Activity  Alcohol Use Yes   Comment: hx abuse- incarcerated at present     Social History   Substance and Sexual Activity  Drug Use Yes  . Types: Cocaine, Marijuana    Social History   Socioeconomic History  . Marital status: Divorced    Spouse name: Not on file  . Number of children: Not on file  . Years of education: Not on file  . Highest education level: Not on file  Occupational History  . Not on file  Social Needs  . Financial resource strain: Not on file  . Food insecurity    Worry: Not on file    Inability: Not on file  . Transportation needs    Medical: Not on file    Non-medical: Not on file  Tobacco Use  . Smoking status: Current Every Day Smoker  . Smokeless tobacco: Never Used  Substance and Sexual Activity  . Alcohol use: Yes    Comment: hx abuse- incarcerated at present  . Drug use: Yes    Types: Cocaine, Marijuana  . Sexual activity: Not on file  Lifestyle  . Physical activity  Days per week: Not on file    Minutes per session: Not on file  . Stress: Not on file  Relationships  . Social Herbalist on phone: Not on file    Gets together: Not on file    Attends religious service: Not on file    Active member of club or organization: Not on file    Attends meetings of clubs or organizations: Not on file    Relationship status: Not on file  Other Topics Concern  . Not on file  Social History Narrative  . Not on file    Additional Social History:   Sleep: Fair  Appetite:  Fair  Current Medications: Current Facility-Administered Medications  Medication Dose Route Frequency Provider Last Rate Last Dose  . acetaminophen (TYLENOL) tablet 650 mg  650 mg Oral Q6H PRN Sharma Covert, MD      . alum & mag hydroxide-simeth (MAALOX/MYLANTA) 200-200-20 MG/5ML suspension 30 mL  30 mL Oral Q4H PRN Sharma Covert, MD      . amLODipine (NORVASC) tablet 10 mg  10 mg Oral Daily Sharma Covert, MD   10 mg at 08/08/19 0349  . calcium carbonate (TUMS - dosed in mg elemental calcium) chewable tablet 200 mg of elemental calcium  200 mg of elemental calcium Oral TID PRN Lindon Romp A, NP      . cyclobenzaprine (FLEXERIL) tablet 5 mg  5 mg Oral TID PRN Sharma Covert, MD   5 mg at 08/07/19 2109  . fluticasone (FLONASE) 50 MCG/ACT nasal spray 2 spray  2 spray Each Nare Daily Sharma Covert, MD   2 spray at 08/08/19 1791  . hydrOXYzine (ATARAX/VISTARIL) tablet 25 mg  25 mg Oral TID PRN Sharma Covert, MD   25 mg at 08/07/19 1623  . magnesium hydroxide (MILK OF MAGNESIA) suspension 30 mL  30 mL Oral Daily PRN Sharma Covert, MD      . naproxen (NAPROSYN) tablet 375 mg  375 mg Oral BID PRN Sharma Covert, MD   375 mg at 08/05/19 1446  . risperiDONE (RISPERDAL) tablet 1 mg  1 mg Oral QHS Nwoko, Agnes I, NP   1 mg at 08/07/19 2200  . sertraline (ZOLOFT) tablet 50 mg  50 mg Oral Daily Lindell Spar I, NP   50 mg at 08/08/19 0721  . terazosin (HYTRIN) capsule 1 mg  1 mg Oral QHS Sharma Covert, MD   1 mg at 08/07/19 2109  . traZODone (DESYREL) tablet 50 mg  50 mg Oral QHS PRN Sharma Covert, MD   50 mg at 08/07/19 2109   Lab Results:  Results for orders placed or performed during the hospital encounter of 08/05/19 (from the past 48 hour(s))  Basic metabolic panel     Status: Abnormal   Collection Time: 08/07/19  6:38 AM  Result Value Ref Range   Sodium 140 135 - 145 mmol/L   Potassium 3.3  (L) 3.5 - 5.1 mmol/L   Chloride 107 98 - 111 mmol/L   CO2 25 22 - 32 mmol/L   Glucose, Bld 94 70 - 99 mg/dL   BUN 14 6 - 20 mg/dL   Creatinine, Ser 1.04 0.61 - 1.24 mg/dL   Calcium 8.3 (L) 8.9 - 10.3 mg/dL   GFR calc non Af Amer >60 >60 mL/min   GFR calc Af Amer >60 >60 mL/min   Anion gap 8 5 - 15    Comment: Performed  at [facility], [place] [address], [place], [place] [address]  TSH     Status: None   Collection Time: 08/07/19  6:38 AM  Result Value Ref Range   TSH 2.180 0.350 - 4.500 uIU/mL    Comment: Performed by a 3rd Generation assay with a functional sensitivity of <=0.01 uIU/mL. Performed at [facility], [place] [address] [place], [place] [address]    Blood Alcohol level:  Lab Results  Component Value Date   ETH <10 08/04/2019   ETH <5 18/59/0931   Metabolic Disorder Labs: No results found for: HGBA1C, MPG No results found for: PROLACTIN No results found for: CHOL, TRIG, HDL, CHOLHDL, VLDL, LDLCALC  Physical Findings: AIMS: Facial and Oral Movements Muscles of Facial Expression: None, normal Lips and Perioral Area: None, normal Jaw: None, normal Tongue: None, normal,Extremity Movements Upper (arms, wrists, hands, fingers): None, normal Lower (legs, knees, ankles, toes): None, normal, Trunk Movements Neck, shoulders, hips: None, normal, Overall Severity Severity of abnormal movements (highest score from questions above): None, normal Incapacitation due to abnormal movements: None, normal Patient's awareness of abnormal movements (rate only patient's report): No Awareness, Dental Status Current problems with teeth and/or dentures?: No Does patient usually wear dentures?: No  CIWA:    COWS:     Musculoskeletal: Strength & Muscle Tone: within normal limits-presents calm, comfortable, in no acute distress.  No restlessness or agitation Gait & Station: normal Patient leans: N/A  Psychiatric Specialty Exam: Physical Exam   Nursing note and vitals reviewed. Constitutional: He is oriented to person, place, and time.  Neck: Normal range of motion.  Cardiovascular:  Elevated blood pressure:122/91. Patient is currently not in any apparent distress.  Respiratory: Effort normal.  Genitourinary:    Genitourinary Comments: Deferred   Musculoskeletal: Normal range of motion.  Neurological: He is alert and oriented to person, place, and time.  Skin: Skin is warm and dry.    Review of Systems  Constitutional: Negative for chills and fever.  Respiratory: Negative for cough, shortness of breath and wheezing.   Cardiovascular: Negative for chest pain and palpitations.  Gastrointestinal: Negative for abdominal pain, heartburn, nausea and vomiting.  Neurological: Negative for dizziness and headaches.  Psychiatric/Behavioral: Positive for depression and substance abuse (Hx. Cocaine use disorder). Negative for hallucinations, memory loss and suicidal ideas. The patient is nervous/anxious. The patient does not have insomnia.   No chest pain at this time, no shortness of breath at room air.  No vomiting  Blood pressure (!) 113/94, pulse 91, temperature 97.8 F (36.6 C), temperature source Oral, resp. rate 20, height '6\' 1"'  (1.854 m), weight 111.6 kg, SpO2 99 %.Body mass index is 32.46 kg/m.  General Appearance: Casual  Eye Contact:  Good  Speech:  Normal Rate  Volume:  Normal  Mood:   Describes still feeling depressed  Affect:   Affect is vaguely constricted but reactive, does smile at times appropriately  Thought Process:  Linear and Descriptions of Associations: Intact  Orientation:   Fully alert and attentive  Thought Content:  No hallucinations, no delusions  Suicidal Thoughts:  Yes.  without intent/plan-at this time denies suicidal plan or intention and contracts for safety on unit  Homicidal Thoughts:  No  Memory:   Recent and remote grossly intact  Judgement:  Fair/improving  Insight:  Fair  Psychomotor  Activity:   Normal, no psychomotor agitation or restlessness at this time  Concentration:  Concentration:  Good and Attention Span: Good  Recall:  Good  Fund of Knowledge:  Good  Language:  Good  Akathisia:  Negative  Handed:  Right  AIMS (if indicated):     Assets:  Communication Skills Desire for Improvement Resilience Social Support Talents/Skills  ADL's:  Intact  Cognition:  WNL    Sleep:  Number of Hours: 3.25    Assessment:  53 year old male, presented on 11/20 describing depression, suicidal ideations of walking into traffic, in the context of recent death of his 52 year old daughter from unknown causes.  He reports he also lost 2 other of his children in the past.  Reports a history of cocaine use disorder, had been sober for more than a year, relapsed recently after finding out that his daughter had passed.  Endorses a history of 1 prior psychiatric admission years ago for depression.  Today patient reports persistent depression, passive SI (but denies suicidal plan or intention and contracts for safety on unit).  Does present future oriented and states he plans to return to [facility] in [place], where he had been residing prior to admission.  Tolerating Zoloft well but today he is requesting to switch to another antidepressant trial as he believes he took Zoloft in the past and it caused sexual side effects.  We discussed other options.  As reports poor sleep, poor appetite, nighttime anxiety, we considered Remeron trial which she agrees to.  Side effects discussed. Treatment Plan Summary: Daily contact with patient to assess and evaluate symptoms and progress in treatment and Medication management. Treatment plan reviewed as below today 11/23 Encourage group and milieu participation Continue Risperdal 1 mg po nightly for mood disorder Discontinue Zoloft-see rationale above Start Remeron 7.5 mg nightly for depression/insomnia Discontinue Trazodone Continue Flonase 50  MCG/ACT 2 sprays each nare daily for allergies Continue Amlodipine 10 mg po daily and Terazosin 1 mg po daily for HTN Continue Naproxen 375 mg BID PRN for pain Start Protonix for gastric protection  Treatment team working on disposition planning options  Jenne Campus, MD   08/08/2019, 1:41 PM  Patient ID: Paul Blair, male   DOB: [birth year], 53 y.o.   MRN: [id number] 11/23 at 5:30 PM Addendum  Reviewed routine EKG.  NSR.  HR 66.  QTc slightly prolonged at 480. Based on the above reviewed medication regimen with patient.  Remeron may be associated with QT prolongation.  We considered other options.  Agrees to Cymbalta trial which may be less likely to cause QTC changes.  (He also reports some chronic pain/reports history of sciatica which may respond to this antidepressant trial) side effects reviewed. For same reason we will discontinue Vistaril PRN and Risperidone . Gabriel Earing MD

## 2019-08-08 NOTE — [facility] Group Notes (Signed)
LCSW Group Therapy Note 08/08/2019 3:44 PM  Type of Therapy and Topic: Group Therapy: Overcoming Obstacles  Participation Level: Active  Description of Group:  In this group patients will be encouraged to explore what they see as obstacles to their own wellness and recovery. They will be guided to discuss their thoughts, feelings, and behaviors related to these obstacles. The group will process together ways to cope with barriers, with attention given to specific choices patients can make. Each patient will be challenged to identify changes they are motivated to make in order to overcome their obstacles. This group will be process-oriented, with patients participating in exploration of their own experiences as well as giving and receiving support and challenge from other group members.  Therapeutic Goals: 1. Patient will identify personal and current obstacles as they relate to admission. 2. Patient will identify barriers that currently interfere with their wellness or overcoming obstacles.  3. Patient will identify feelings, thought process and behaviors related to these barriers. 4. Patient will identify two changes they are willing to make to overcome these obstacles:   Summary of Patient Progress  Paul Blair was engaged and participated throughout the group session. Paul Blair states that "what he cannot overcome, will overcome him". Paul Blair states that no matter the obstacle, he will get "over it".     Therapeutic Modalities:  Cognitive Behavioral Therapy Solution Focused Therapy Motivational Interviewing Relapse Prevention Therapy   [place] Social Worker \

## 2019-08-08 NOTE — Progress Notes (Signed)
   08/08/19 2200  Psych Admission Type (Psych Patients Only)  Admission Status Voluntary  Psychosocial Assessment  Patient Complaints Anxiety  Eye Contact Brief  Facial Expression Flat  Affect Appropriate to circumstance  Speech Logical/coherent  Interaction Minimal  Motor Activity Other (Comment) (WDL)  Appearance/Hygiene Unremarkable  Behavior Characteristics Anxious  Mood Depressed  Aggressive Behavior  Effect No apparent injury  Thought Process  Coherency WDL  Content WDL  Delusions None reported or observed  Perception Hallucinations  Hallucination Auditory  Judgment WDL  Confusion None  Danger to Self  Current suicidal ideation? Passive  Self-Injurious Behavior Some self-injurious ideation observed or expressed.  No lethal plan expressed   Agreement Not to Harm Self Yes  Description of Agreement contracts in hospital  Danger to Others  Danger to Others None reported or observed

## 2019-08-08 NOTE — Tx Team (Signed)
Interdisciplinary Treatment and Diagnostic Plan Update  08/08/2019 Time of Session: 9:00am Paul Blair MRN: [id number]  Principal Diagnosis: Major depressive disorder, recurrent episode, severe ([place])  Secondary Diagnoses: Principal Problem:   Major depressive disorder, recurrent episode, severe ([place]) Active Problems:   MDD (major depressive disorder), recurrent, severe, with psychosis ([place])   Major depressive disorder, single episode, severe ([place])   Current Medications:  Current Facility-Administered Medications  Medication Dose Route Frequency Provider Last Rate Last Dose  . acetaminophen (TYLENOL) tablet 650 mg  650 mg Oral Q6H PRN Sharma Covert, MD      . alum & mag hydroxide-simeth (MAALOX/MYLANTA) 200-200-20 MG/5ML suspension 30 mL  30 mL Oral Q4H PRN Sharma Covert, MD      . amLODipine (NORVASC) tablet 10 mg  10 mg Oral Daily Sharma Covert, MD   10 mg at 08/08/19 4166  . calcium carbonate (TUMS - dosed in mg elemental calcium) chewable tablet 200 mg of elemental calcium  200 mg of elemental calcium Oral TID PRN Lindon Romp A, NP      . cyclobenzaprine (FLEXERIL) tablet 5 mg  5 mg Oral TID PRN Sharma Covert, MD   5 mg at 08/07/19 2109  . fluticasone (FLONASE) 50 MCG/ACT nasal spray 2 spray  2 spray Each Nare Daily Sharma Covert, MD   2 spray at 08/08/19 0630  . hydrOXYzine (ATARAX/VISTARIL) tablet 25 mg  25 mg Oral TID PRN Sharma Covert, MD   25 mg at 08/07/19 1623  . magnesium hydroxide (MILK OF MAGNESIA) suspension 30 mL  30 mL Oral Daily PRN Sharma Covert, MD      . naproxen (NAPROSYN) tablet 375 mg  375 mg Oral BID PRN Sharma Covert, MD   375 mg at 08/05/19 1446  . risperiDONE (RISPERDAL) tablet 1 mg  1 mg Oral QHS Nwoko, Agnes I, NP   1 mg at 08/07/19 2200  . sertraline (ZOLOFT) tablet 50 mg  50 mg Oral Daily Lindell Spar I, NP   50 mg at 08/08/19 0721  . terazosin (HYTRIN) capsule 1 mg  1 mg Oral QHS Sharma Covert, MD   1 mg at  08/07/19 2109  . traZODone (DESYREL) tablet 50 mg  50 mg Oral QHS PRN Sharma Covert, MD   50 mg at 08/07/19 2109   PTA Medications: Medications Prior to Admission  Medication Sig Dispense Refill Last Dose  . albuterol (PROVENTIL HFA;VENTOLIN HFA) 108 (90 Base) MCG/ACT inhaler Inhale 2 puffs into the lungs every 6 (six) hours as needed.     Marland Kitchen amLODipine (NORVASC) 10 MG tablet Take 10 mg by mouth daily.     Marland Kitchen amoxicillin-clavulanate (AUGMENTIN) 875-125 MG tablet Take 1 tablet by mouth 2 (two) times daily.     . ARIPiprazole (ABILIFY) 5 MG tablet Take 1 tablet (5 mg total) by mouth daily. 10 tablet 0   . carvedilol (COREG) 6.25 MG tablet Take 6.25 mg by mouth 2 (two) times daily with a meal.     . citalopram (CELEXA) 20 MG tablet Take 1 tablet (20 mg total) by mouth daily. 10 tablet 0   . cyclobenzaprine (FLEXERIL) 10 MG tablet Take 1 tablet (10 mg total) by mouth 2 (two) times daily as needed for muscle spasms. 20 tablet 0   . fluticasone (FLONASE) 50 MCG/ACT nasal spray Place into the nose.     . ibuprofen (ADVIL,MOTRIN) 800 MG tablet Take 1 tablet (800 mg total) by mouth 3 (three)  times daily. 21 tablet 0   . traZODone (DESYREL) 150 MG tablet Take 150 mg by mouth at bedtime.       Patient Stressors: Financial difficulties Health problems Substance abuse  Patient Strengths: Ability for insight Average or above average intelligence Communication skills  Treatment Modalities: Medication Management, Group therapy, Case management,  1 to 1 session with clinician, Psychoeducation, Recreational therapy.   Physician Treatment Plan for Primary Diagnosis: Major depressive disorder, recurrent episode, severe (HCC) Long Term Goal(s): Improvement in symptoms so as ready for discharge Improvement in symptoms so as ready for discharge   Short Term Goals: Ability to identify changes in lifestyle to reduce recurrence of condition will improve Ability to verbalize feelings will improve Ability  to disclose and discuss suicidal ideas Ability to demonstrate self-control will improve Ability to identify and develop effective coping behaviors will improve Ability to maintain clinical measurements within normal limits will improve Ability to identify changes in lifestyle to reduce recurrence of condition will improve Ability to verbalize feelings will improve Ability to disclose and discuss suicidal ideas Ability to demonstrate self-control will improve Ability to identify and develop effective coping behaviors will improve Ability to maintain clinical measurements within normal limits will improve  Medication Management: Evaluate patient's response, side effects, and tolerance of medication regimen.  Therapeutic Interventions: 1 to 1 sessions, Unit Group sessions and Medication administration.  Evaluation of Outcomes: Progressing  Physician Treatment Plan for Secondary Diagnosis: Principal Problem:   Major depressive disorder, recurrent episode, severe (HCC) Active Problems:   MDD (major depressive disorder), recurrent, severe, with psychosis (HCC)   Major depressive disorder, single episode, severe (HCC)  Long Term Goal(s): Improvement in symptoms so as ready for discharge Improvement in symptoms so as ready for discharge   Short Term Goals: Ability to identify changes in lifestyle to reduce recurrence of condition will improve Ability to verbalize feelings will improve Ability to disclose and discuss suicidal ideas Ability to demonstrate self-control will improve Ability to identify and develop effective coping behaviors will improve Ability to maintain clinical measurements within normal limits will improve Ability to identify changes in lifestyle to reduce recurrence of condition will improve Ability to verbalize feelings will improve Ability to disclose and discuss suicidal ideas Ability to demonstrate self-control will improve Ability to identify and develop effective  coping behaviors will improve Ability to maintain clinical measurements within normal limits will improve     Medication Management: Evaluate patient's response, side effects, and tolerance of medication regimen.  Therapeutic Interventions: 1 to 1 sessions, Unit Group sessions and Medication administration.  Evaluation of Outcomes: Progressing   RN Treatment Plan for Primary Diagnosis: Major depressive disorder, recurrent episode, severe (HCC) Long Term Goal(s): Knowledge of disease and therapeutic regimen to maintain health will improve  Short Term Goals: Ability to verbalize feelings will improve, Ability to identify and develop effective coping behaviors will improve and Compliance with prescribed medications will improve  Medication Management: RN will administer medications as ordered by provider, will assess and evaluate patient's response and provide education to patient for prescribed medication. RN will report any adverse and/or side effects to prescribing provider.  Therapeutic Interventions: 1 on 1 counseling sessions, Psychoeducation, Medication administration, Evaluate responses to treatment, Monitor vital signs and CBGs as ordered, Perform/monitor CIWA, COWS, AIMS and Fall Risk screenings as ordered, Perform wound care treatments as ordered.  Evaluation of Outcomes: Progressing   LCSW Treatment Plan for Primary Diagnosis: Major depressive disorder, recurrent episode, severe (HCC) Long Term Goal(s): Safe transition  to appropriate next level of care at discharge, Engage patient in therapeutic group addressing interpersonal concerns.  Short Term Goals: Engage patient in aftercare planning with referrals and resources, Increase social support, Increase emotional regulation, Identify triggers associated with mental health/substance abuse issues and Increase skills for wellness and recovery  Therapeutic Interventions: Assess for all discharge needs, 1 to 1 time with Social worker,  Explore available resources and support systems, Assess for adequacy in community support network, Educate family and significant other(s) on suicide prevention, Complete Psychosocial Assessment, Interpersonal group therapy.  Evaluation of Outcomes: Progressing   Progress in Treatment: Attending groups: Yes. Participating in groups: Yes. Taking medication as prescribed: Yes. Toleration medication: Yes. Family/Significant other contact made: No, will contact:  declined consents, SPE reviewed with patient. Patient understands diagnosis: Yes. Discussing patient identified problems/goals with staff: Yes. Medical problems stabilized or resolved: Yes. Denies suicidal/homicidal ideation: Yes. Issues/concerns per patient self-inventory: Yes.  New problem(s) identified: Yes, Describe:  grief/loss, homelessness  New Short Term/Long Term Goal(s): detox, medication management for mood stabilization; elimination of SI thoughts; development of comprehensive mental wellness/sobriety plan.  Patient Goals:  Wants to return to [employer].  Discharge Plan or Barriers: Wants to return to [facility]. CSW assessing for appropriate referrals.   Reason for Continuation of Hospitalization: Anxiety Depression Medication stabilization  Estimated Length of Stay: 1-3 days  Attendees: Patient: 08/08/2019 9:40 AM  Physician: Paul Blair 08/08/2019 9:40 AM  Nursing: Meriam Sprague, RN 08/08/2019 9:40 AM  RN Care Manager: 08/08/2019 9:40 AM  Social Worker: Enid Cutter, LCSWA 08/08/2019 9:40 AM  Recreational Therapist:  08/08/2019 9:40 AM  Other:  08/08/2019 9:40 AM  Other:  08/08/2019 9:40 AM  Other: 08/08/2019 9:40 AM    Scribe for Treatment Team: Darreld Mclean, LCSWA 08/08/2019 9:40 AM

## 2019-08-08 NOTE — Progress Notes (Signed)
Recreation Therapy Notes  Date:  11.23.20 Time: 0930 Location: [address] Dayroom  Group Topic: Stress Management  Goal Area(s) Addresses:  Patient will identify positive stress management techniques. Patient will identify benefits of using stress management post d/c.  Intervention: Stress Management  Activity :  Meditation.  LRT introduced the stress management technique of mountain meditation.  LRT played a meditation that focused on taking in the characteristics of a mountain.  Patients were to listen and follow along as meditation played to fully participate in activity.  Education:  Stress Management, Discharge Planning.   Education Outcome: Acknowledges Education  Clinical Observations/Feedback: Pt did not attend activity.    Victorino Sparrow, LRT/CTRS    Victorino Sparrow A 08/08/2019 11:28 AM

## 2019-08-08 NOTE — Plan of Care (Signed)
Nurse discussed anxiety, depression and coping skills with patient.  

## 2019-08-08 NOTE — Progress Notes (Signed)
D.  Pt pleasant on approach, no complaints voiced at this time.  Pt observed interacting appropriately with peers on the unit.  Pt denies SI/HI/AVH at this time.  A.  Support and encouragement offered  R.  Pt remains safe on the unit, will continue to monitor.

## 2019-08-08 NOTE — Progress Notes (Signed)
D:  Patient denied SI and HI, contracts for safety.  Denied A/V hallucinations.   A:  Medications administered per MD orders.  Emotional support and encouragement given patient. R:  Safety maintained with 15 minute checks.  

## 2019-08-09 DIAGNOSIS — F333 Major depressive disorder, recurrent, severe with psychotic symptoms: Principal | ICD-10-CM

## 2019-08-09 MED ORDER — OLANZAPINE 5 MG PO TABS
5.0000 mg | ORAL_TABLET | Freq: Every day | ORAL | Status: DC
  Administered 2019-08-09 – 2019-08-10 (×2): 5 mg via ORAL
  Filled 2019-08-09 (×3): qty 1

## 2019-08-09 MED ORDER — HYDROXYZINE HCL 25 MG PO TABS
25.0000 mg | ORAL_TABLET | Freq: Once | ORAL | Status: AC
  Administered 2019-08-09: 25 mg via ORAL
  Filled 2019-08-09 (×2): qty 1

## 2019-08-09 MED ORDER — LORAZEPAM 0.5 MG PO TABS
0.5000 mg | ORAL_TABLET | Freq: Three times a day (TID) | ORAL | Status: DC | PRN
  Administered 2019-08-10 – 2019-08-12 (×3): 0.5 mg via ORAL
  Filled 2019-08-09 (×3): qty 1

## 2019-08-09 NOTE — Progress Notes (Signed)
   08/09/19 2200  Psych Admission Type (Psych Patients Only)  Admission Status Voluntary  Psychosocial Assessment  Patient Complaints Anxiety;Depression  Eye Contact Fair  Facial Expression Anxious;Sullen;Sad;Worried  Affect Anxious;Depressed;Preoccupied;Sad;Sullen  Theatre stage manager;Restless;Slow  Appearance/Hygiene Unremarkable  Behavior Characteristics Cooperative  Mood Depressed  Aggressive Behavior  Effect No apparent injury  Thought Process  Coherency WDL  Content WDL  Delusions None reported or observed  Perception WDL  Hallucination None reported or observed  Judgment Poor  Confusion None  Danger to Self  Current suicidal ideation? Passive  Self-Injurious Behavior Some self-injurious ideation observed or expressed.  No lethal plan expressed   Agreement Not to Harm Self Yes  Description of Agreement contracts in hospital  Danger to Others  Danger to Others None reported or observed   Pt passive SI/ AH- stated he felt little better.

## 2019-08-09 NOTE — Progress Notes (Signed)
Jayzen requested support with chaplain following grief group.  Requested bible and shared prayers with chaplain around shifts he is hoping for in relationships following death of daughter.  Naveen expressed some frustration with being "checked out" of his room at [facility], but during conversation, he exercised problem solving - noticing his frustration, identifying elements he could address, and encouraging himself to find peace with elements that were not in his control.

## 2019-08-09 NOTE — Progress Notes (Signed)
Recreation Therapy Notes  Animal-Assisted Activity (AAA) Program Checklist/Progress Notes Patient Eligibility Criteria Checklist & Daily Group note for Rec Tx Intervention  Date: 11.24.20 Time: 52 Location: [address]  AAA/T Program Assumption of Risk Form signed by Teacher, music or Parent Legal Guardian  YES   Patient is free of allergies or sever asthma  YES   Patient reports no fear of animals  YES   Patient reports no history of cruelty to animals  YES   Patient understands his/her participation is voluntary YES   Patient washes hands before animal contact  YES   Patient washes hands after animal contact  YES   Behavioral Response: Engaged  Education: Contractor, Appropriate Animal Interaction   Education Outcome: Acknowledges understanding/In group clarification offered/Needs additional education.   Clinical Observations/Feedback:  Pt attended and participated in activity.   Victorino Sparrow, LRT/CTRS         Victorino Sparrow A 08/09/2019 3:32 PM

## 2019-08-09 NOTE — Progress Notes (Addendum)
[facility]  08/09/2019 2:08 PM Paul Blair  MRN:  [id number] Subjective:  "I'm not doing so good."  Paul Blair found talking on the phone. He reports stress and frustration regarding unknown status of his re-entry into [facility]. There is a COVID outbreak there, and he has received conflicting answers over the phone regarding whether he will be able to return. He also complains of a headache, which he believes to be related to sleep deprivation over the last two nights. 6.75 hours of sleep recorded. Per prior notes, Risperdal, Vistaril and Remeron were discontinued yesterday due to QTc of 480. Patient is complaining of insomnia as well as denigrating auditory hallucinations over the last two days. He does report history of auditory hallucinations but states it has been several years since he last heard voices. Blood pressure was elevated this morning- patient believes this to be related to stress and lack of sleep. He reports checking daily blood pressures at home which are typically 130s systolic. He denies any other drug/alcohol use besides cocaine. He is complaining of anxiety and requests PRN medication. He reports continuing intermittent SI to run into traffic or overdose on pills. Denies HI.  From admission H&P: Patient is a 53 year old male with a past psychiatric history significant for probable posttraumatic stress disorder, cocaine use disorder, depression, hypertension and cirrhosis who [facility] department on 08/05/2019 with suicidal ideation. The patient stated that approximately 3 days ago he was notified by telephone that his daughter had passed. After he found out about the death of his daughter he relapsed on cocaine.  Principal Problem: Major depressive disorder, recurrent episode, severe (HCC) Diagnosis: Principal Problem:   Major depressive disorder, recurrent episode, severe (HCC) Active Problems:   MDD (major  depressive disorder), recurrent, severe, with psychosis (HCC)   Major depressive disorder, single episode, severe (HCC)  Total Time spent with patient: 20 minutes  Past Psychiatric History: See admission H&P  Past Medical History:  Past Medical History:  Diagnosis Date  . Cocaine abuse (HCC)   . Depression   . History of ETOH abuse   . Homicidal ideations   . Hypertension   . Liver cirrhosis (HCC)   . Substance induced mood disorder (HCC)   . Suicidal ideations    History reviewed. No pertinent surgical history. Family History: History reviewed. No pertinent family history. Family Psychiatric  History: See admission H&P Social History:  Social History   Substance and Sexual Activity  Alcohol Use Yes   Comment: hx abuse- incarcerated at present     Social History   Substance and Sexual Activity  Drug Use Yes  . Types: Cocaine, Marijuana    Social History   Socioeconomic History  . Marital status: Divorced    Spouse name: Not on file  . Number of children: Not on file  . Years of education: Not on file  . Highest education level: Not on file  Occupational History  . Not on file  Social Needs  . Financial resource strain: Not on file  . Food insecurity    Worry: Not on file    Inability: Not on file  . Transportation needs    Medical: Not on file    Non-medical: Not on file  Tobacco Use  . Smoking status: Current Every Day Smoker  . Smokeless tobacco: Never Used  Substance and Sexual Activity  . Alcohol use: Yes    Comment: hx abuse- incarcerated at present  .  Drug use: Yes    Types: Cocaine, Marijuana  . Sexual activity: Not on file  Lifestyle  . Physical activity    Days per week: Not on file    Minutes per session: Not on file  . Stress: Not on file  Relationships  . Social Herbalist on phone: Not on file    Gets together: Not on file    Attends religious service: Not on file    Active member of club or organization: Not on file     Attends meetings of clubs or organizations: Not on file    Relationship status: Not on file  Other Topics Concern  . Not on file  Social History Narrative  . Not on file   Additional Social History:                         Sleep: Poor  Appetite:  Good  Current Medications: Current Facility-Administered Medications  Medication Dose Route Frequency Provider Last Rate Last Dose  . acetaminophen (TYLENOL) tablet 650 mg  650 mg Oral Q6H PRN Sharma Covert, MD      . alum & mag hydroxide-simeth (MAALOX/MYLANTA) 200-200-20 MG/5ML suspension 30 mL  30 mL Oral Q4H PRN Sharma Covert, MD      . amLODipine (NORVASC) tablet 10 mg  10 mg Oral Daily Sharma Covert, MD   10 mg at 08/09/19 0857  . calcium carbonate (TUMS - dosed in mg elemental calcium) chewable tablet 200 mg of elemental calcium  200 mg of elemental calcium Oral TID PRN Lindon Romp A, NP      . cyclobenzaprine (FLEXERIL) tablet 5 mg  5 mg Oral TID PRN Sharma Covert, MD   5 mg at 08/09/19 0902  . DULoxetine (CYMBALTA) DR capsule 30 mg  30 mg Oral Daily Mistie Adney, Myer Peer, MD   30 mg at 08/09/19 0858  . fluticasone (FLONASE) 50 MCG/ACT nasal spray 2 spray  2 spray Each Nare Daily Sharma Covert, MD   2 spray at 08/09/19 0857  . LORazepam (ATIVAN) tablet 0.5 mg  0.5 mg Oral TID PRN Connye Burkitt, NP      . magnesium hydroxide (MILK OF MAGNESIA) suspension 30 mL  30 mL Oral Daily PRN Sharma Covert, MD      . naproxen (NAPROSYN) tablet 375 mg  375 mg Oral BID PRN Sharma Covert, MD   375 mg at 08/09/19 0904  . OLANZapine (ZYPREXA) tablet 5 mg  5 mg Oral QHS Connye Burkitt, NP      . pantoprazole (PROTONIX) EC tablet 40 mg  40 mg Oral Daily Tamey Wanek, Myer Peer, MD   40 mg at 08/09/19 0859  . potassium chloride (KLOR-CON) CR tablet 10 mEq  10 mEq Oral BID Kendy Haston, Myer Peer, MD   10 mEq at 08/09/19 0857  . terazosin (HYTRIN) capsule 1 mg  1 mg Oral QHS Sharma Covert, MD   1 mg at 08/08/19 2212     Lab Results: No results found for this or any previous visit (from the past 64 hour(s)).  Blood Alcohol level:  Lab Results  Component Value Date   ETH <10 08/04/2019   ETH <5 41/32/4401    Metabolic Disorder Labs: No results found for: HGBA1C, MPG No results found for: PROLACTIN No results found for: CHOL, TRIG, HDL, CHOLHDL, VLDL, LDLCALC  Physical Findings: AIMS: Facial and Oral Movements Muscles of Facial  Expression: None, normal Lips and Perioral Area: None, normal Jaw: None, normal Tongue: None, normal,Extremity Movements Upper (arms, wrists, hands, fingers): None, normal Lower (legs, knees, ankles, toes): None, normal, Trunk Movements Neck, shoulders, hips: None, normal, Overall Severity Severity of abnormal movements (highest score from questions above): None, normal Incapacitation due to abnormal movements: None, normal Patient's awareness of abnormal movements (rate only patient's report): No Awareness, Dental Status Current problems with teeth and/or dentures?: No Does patient usually wear dentures?: No  CIWA:  CIWA-Ar Total: 1 COWS:  COWS Total Score: 2  Musculoskeletal: Strength & Muscle Tone: within normal limits Gait & Station: normal Patient leans: N/A  Psychiatric Specialty Exam: Physical Exam  Nursing note and vitals reviewed. Constitutional: He is oriented to person, place, and time. He appears well-developed and well-nourished.  Cardiovascular: Normal rate.  Respiratory: Effort normal.  Neurological: He is alert and oriented to person, place, and time.    Review of Systems  Constitutional: Negative.   Respiratory: Negative for cough and shortness of breath.   Cardiovascular: Negative for chest pain.  Psychiatric/Behavioral: Positive for depression, substance abuse (cocaine) and suicidal ideas. Negative for hallucinations. The patient is nervous/anxious and has insomnia.     Blood pressure (!) 164/128, pulse 77, temperature (!) 97.3 F (36.3  C), temperature source Oral, resp. rate 16, height 6\' 1"  (1.854 m), weight 111.6 kg, SpO2 99 %.Body mass index is 32.46 kg/m.  General Appearance: Casual  Eye Contact:  Good  Speech:  Normal Rate  Volume:  Normal  Mood:  Anxious  Affect:  Congruent  Thought Process:  Coherent  Orientation:  Full (Time, Place, and Person)  Thought Content:  Logical  Suicidal Thoughts:  Yes.  with intent/plan thoughts of overdosing on pills or walking into traffic. Denies suicidal plan or intent on the unit.  Homicidal Thoughts:  No  Memory:  Immediate;   Good Recent;   Good  Judgement:  Intact  Insight:  Fair  Psychomotor Activity:  Normal  Concentration:  Concentration: Good and Attention Span: Fair  Recall:  Good  Fund of Knowledge:  Fair  Language:  Good  Akathisia:  No  Handed:  Right  AIMS (if indicated):     Assets:  Communication Skills Desire for Improvement Financial Resources/Insurance Resilience  ADL's:  Intact  Cognition:  WNL  Sleep:  Number of Hours: 6.75     Treatment Plan Summary: Daily contact with patient to assess and evaluate symptoms and progress in treatment and Medication management   Continue inpatient hospitalization.  Start Zyprexa 5 mg PO QHS for AH/insomnia Start Ativan 0.5 mg PO TID PRN anxiety Continue Cymbalta 30 mg PO daily for mood/anxiety Continue Norvasc 10 mg PO daily for HTN Continue Flexeril 5 mg PO TID PRN muscle spasms Continue Protonix 40 mg PO daily for GERD Continue Klor-Con 10 mEq PO BID for 3 doses for hypokalemia Continue terazosin 1 mg PO QHS for BPH  Patient will participate in the therapeutic group milieu.  Discharge disposition in progress.   , NP 08/09/2019, 2:08 PM   Agree with NP Progress Note

## 2019-08-09 NOTE — Plan of Care (Addendum)
Progress note  D: pt found in their room; compliant with medication administration. Pt has complaints of "not knowing where to go" after the death of their daughter. Pt feels "lost" and still endorses thoughts of suicide. Pt has complaints of increasing anxiety. Pt states they have been using prayer and reading to try and get "unstuck". Pt does complain of lower back pain. Pt denies hi/ah/vh and verbally agrees to approach staff if these become apparent or before harming themself/others while at [place].  A: Pt provided support and encouragement. Pt given medication per protocol and standing orders. Q98m safety checks implemented and continued.  R: Pt safe on the unit. Will continue to monitor.  Pt progressing in the following metrics  Problem: Education: Goal: Knowledge of [place] [facility] Education information/materials will improve Outcome: Progressing   Problem: Coping: Goal: Coping ability will improve Outcome: Progressing Goal: Will verbalize feelings Outcome: Progressing

## 2019-08-09 NOTE — BHH Counselor (Signed)
CSW met with patient at bedside to discuss discharge plans. Patient reports he came to [facility] from [facility] and he plans to return at discharge.  CSW spoke with admissions director at [facility] who reports that admissions and re-entries are halted at this time due to a COVID outbreak at the shelter earlier. Per admissions director, there is not a date at which readmissions are expected to begin.  Patient states he is aware of this, but he reports he expects to return because "I've never left, as far as they know I'm there." Patient reports he will call a senior member of management regarding his status.  CSW offered patient shelter resources other than [facility], patient declined preferring to follow up with [facility]. CSW asked to be updated of decision from [place], shelter resources placed on patient's chart.  Stephanie Acre, MSW, [place] Social Worker [facility]  [phone]

## 2019-08-10 MED ORDER — DULOXETINE HCL 20 MG PO CPEP
40.0000 mg | ORAL_CAPSULE | Freq: Every day | ORAL | Status: DC
  Administered 2019-08-11 – 2019-08-13 (×3): 40 mg via ORAL
  Filled 2019-08-10 (×5): qty 2

## 2019-08-10 NOTE — Progress Notes (Addendum)
[facility] Progress [facility]  08/10/2019 11:30 AM Paul Blair  MRN:  [id number] Subjective:  "I don't know. I haven't slept much for two night. I slept some better last night. The voices were louder last night."  Paul Blair is a 53 year old male with a past psychiatric history significant for probable posttraumatic stress disorder, cocaine use disorder, depression, hypertension and cirrhosis who presented to the [facility] emergency department on 08/05/2019 with suicidal ideation. The patient stated that approximately 3 days ago he was notified by telephone that his daughter had passed. After he found out about the death of his daughter he relapsed on cocaine.  Mr. Paul Blair reports that he thinks his mood is "getting a little better." He states that his bother picked up his card and was supposed to use it to pay his cell phone bill but he has not paid it. He states that he as been "relaxed about it" and not irritated or angry. He states that he is not sleeping well and that the auditory hallucinations are louder at night when he is in bed. Patient was started on Zyprexa 5 mg QHS last night for auditory hallucinations/insomnia. He denies any medication side effects. He reports that he plans to return to [facility] after discharge to pick up his belongings. Social Work provided him with a list of homeless shelters in [place] and he has been encouraged to contact [facility] to secure a shelter bed. He states that he does feel that he is ready for discharge today.  On evaluation patient is alert and oriented x 4, pleasant, and cooperative. Speech is clear and coherent. Mood is anxious and affect is congruent with mood. Thought process is coherent, linear, and descriptions of association are intact. Reports auditory hallucinations that occur mostly when he is in bed. Denies visual hallucinations. No paranoia. No delusions elicited. No indication that he is responding to internal  stimuli. He continues to report suicidal ideations at times. He denies any intent. He is able to contract for safety while on the unit. Denies homicidal ideations.  Principal Problem: Major depressive disorder, recurrent episode, severe (HCC) Diagnosis: Principal Problem:   Major depressive disorder, recurrent episode, severe (HCC) Active Problems:   MDD (major depressive disorder), recurrent, severe, with psychosis (HCC)   Major depressive disorder, single episode, severe (HCC)  Total Time spent with patient: 20 minutes  Past Psychiatric History: Patient stated that he had 1 previous psychiatric admission many years ago.  He was followed as an outpatient but he was at a community clinic.  He stated he received multiple medications, and most of those were samples because he had no resources at the time.  He does not recall many of their names.  Past Medical History:  Past Medical History:  Diagnosis Date  . Cocaine abuse (HCC)   . Depression   . History of ETOH abuse   . Homicidal ideations   . Hypertension   . Liver cirrhosis (HCC)   . Substance induced mood disorder (HCC)   . Suicidal ideations    History reviewed. No pertinent surgical history. Family History: History reviewed. No pertinent family history. Family Psychiatric  History: See admission H&P Social History:  Social History   Substance and Sexual Activity  Alcohol Use Yes   Comment: hx abuse- incarcerated at present     Social History   Substance and Sexual Activity  Drug Use Yes  . Types: Cocaine, Marijuana    Social History   Socioeconomic History  .  Marital status: Divorced    Spouse name: Not on file  . Number of children: Not on file  . Years of education: Not on file  . Highest education level: Not on file  Occupational History  . Not on file  Social Needs  . Financial resource strain: Not on file  . Food insecurity    Worry: Not on file    Inability: Not on file  . Transportation needs     Medical: Not on file    Non-medical: Not on file  Tobacco Use  . Smoking status: Current Every Day Smoker  . Smokeless tobacco: Never Used  Substance and Sexual Activity  . Alcohol use: Yes    Comment: hx abuse- incarcerated at present  . Drug use: Yes    Types: Cocaine, Marijuana  . Sexual activity: Not on file  Lifestyle  . Physical activity    Days per week: Not on file    Minutes per session: Not on file  . Stress: Not on file  Relationships  . Social Herbalist on phone: Not on file    Gets together: Not on file    Attends religious service: Not on file    Active member of club or organization: Not on file    Attends meetings of clubs or organizations: Not on file    Relationship status: Not on file  Other Topics Concern  . Not on file  Social History Narrative  . Not on file   Additional Social History:                         Sleep: Poor  Appetite:  Good  Current Medications: Current Facility-Administered Medications  Medication Dose Route Frequency Provider Last Rate Last Dose  . acetaminophen (TYLENOL) tablet 650 mg  650 mg Oral Q6H PRN Sharma Covert, MD      . alum & mag hydroxide-simeth (MAALOX/MYLANTA) 200-200-20 MG/5ML suspension 30 mL  30 mL Oral Q4H PRN Sharma Covert, MD      . amLODipine (NORVASC) tablet 10 mg  10 mg Oral Daily Sharma Covert, MD   10 mg at 08/10/19 0749  . calcium carbonate (TUMS - dosed in mg elemental calcium) chewable tablet 200 mg of elemental calcium  200 mg of elemental calcium Oral TID PRN Lindon Romp A, NP      . cyclobenzaprine (FLEXERIL) tablet 5 mg  5 mg Oral TID PRN Sharma Covert, MD   5 mg at 08/10/19 0751  . DULoxetine (CYMBALTA) DR capsule 30 mg  30 mg Oral Daily Paul Blair, Paul Peer, MD   30 mg at 08/10/19 0749  . fluticasone (FLONASE) 50 MCG/ACT nasal spray 2 spray  2 spray Each Nare Daily Sharma Covert, MD   2 spray at 08/10/19 0749  . LORazepam (ATIVAN) tablet 0.5 mg  0.5 mg  Oral TID PRN Connye Burkitt, NP      . magnesium hydroxide (MILK OF MAGNESIA) suspension 30 mL  30 mL Oral Daily PRN Sharma Covert, MD      . naproxen (NAPROSYN) tablet 375 mg  375 mg Oral BID PRN Sharma Covert, MD   375 mg at 08/10/19 0751  . OLANZapine (ZYPREXA) tablet 5 mg  5 mg Oral QHS Connye Burkitt, NP   5 mg at 08/09/19 2118  . pantoprazole (PROTONIX) EC tablet 40 mg  40 mg Oral Daily Paul Blair, Paul Peer, MD  40 mg at 08/10/19 0749  . terazosin (HYTRIN) capsule 1 mg  1 mg Oral QHS Antonieta Pert, MD   1 mg at 08/09/19 2118    Lab Results: No results found for this or any previous visit (from the past 48 hour(s)).  Blood Alcohol level:  Lab Results  Component Value Date   ETH <10 08/04/2019   ETH <5 05/20/2016    Metabolic Disorder Labs: No results found for: HGBA1C, MPG No results found for: PROLACTIN No results found for: CHOL, TRIG, HDL, CHOLHDL, VLDL, LDLCALC  Physical Findings: AIMS: Facial and Oral Movements Muscles of Facial Expression: None, normal Lips and Perioral Area: None, normal Jaw: None, normal Tongue: None, normal,Extremity Movements Upper (arms, wrists, hands, fingers): None, normal Lower (legs, knees, ankles, toes): None, normal, Trunk Movements Neck, shoulders, hips: None, normal, Overall Severity Severity of abnormal movements (highest score from questions above): None, normal Incapacitation due to abnormal movements: None, normal Patient's awareness of abnormal movements (rate only patient's report): No Awareness, Dental Status Current problems with teeth and/or dentures?: No Does patient usually wear dentures?: No  CIWA:  CIWA-Ar Total: 1 COWS:  COWS Total Score: 2  Musculoskeletal: Strength & Muscle Tone: within normal limits Gait & Station: normal Patient leans: N/A  Psychiatric Specialty Exam: Physical Exam  Nursing note and vitals reviewed. Constitutional: He is oriented to person, place, and time. He appears well-developed  and well-nourished.  Cardiovascular: Normal rate.  Respiratory: Effort normal.  Neurological: He is alert and oriented to person, place, and time.    Review of Systems  Constitutional: Negative.   Respiratory: Negative for cough and shortness of breath.   Cardiovascular: Negative for chest pain.  Psychiatric/Behavioral: Positive for depression, substance abuse (cocaine) and suicidal ideas. Negative for hallucinations. The patient is nervous/anxious and has insomnia.     Blood pressure 110/77, pulse 93, temperature (!) 97.5 F (36.4 C), temperature source Oral, resp. rate 16, height 6\' 1"  (1.854 m), weight 111.6 kg, SpO2 96 %.Body mass index is 32.46 kg/m.  General Appearance: Casual  Eye Contact:  Good  Speech:  Normal Rate  Volume:  Normal  Mood:  Anxious  Affect:  Congruent  Thought Process:  Coherent  Orientation:  Full (Time, Place, and Person)  Thought Content:  Logical  Suicidal Thoughts:  Yes.  with intent/plan thoughts of overdosing on pills or walking into traffic. Denies suicidal plan or intent on the unit.  Homicidal Thoughts:  No  Memory:  Immediate;   Good Recent;   Good  Judgement:  Intact  Insight:  Fair  Psychomotor Activity:  Normal  Concentration:  Concentration: Good and Attention Span: Fair  Recall:  Good  Fund of Knowledge:  Fair  Language:  Good  Akathisia:  No  Handed:  Right  AIMS (if indicated):     Assets:  Communication Skills Desire for Improvement Financial Resources/Insurance Resilience  ADL's:  Intact  Cognition:  WNL  Sleep:  Number of Hours: 6.75     Treatment Plan Summary: Daily contact with patient to assess and evaluate symptoms and progress in treatment and Medication management   Continue inpatient hospitalization.  Continue Zyprexa 5 mg PO QHS for AH/insomnia Continue Ativan 0.5 mg PO TID PRN anxiety Continue Cymbalta 30 mg PO daily for mood/anxiety Continue Norvasc 10 mg PO daily for HTN Continue Flexeril 5 mg PO TID PRN  muscle spasms Continue Protonix 40 mg PO daily for GERD Continue Klor-Con 10 mEq PO BID for 3 doses for hypokalemia  Continue terazosin 1 mg PO QHS for BPH  Patient will participate in the therapeutic group milieu.  Discharge disposition in progress.   Jackelyn Poling, NP 08/10/2019, 11:30 AM   Patient seen with Barbara Cower, NP and case reviewed with treatment team. 53 year old male, presented on 11/20 reporting worsening depression and suicidal ideations with thoughts of walking into traffic.  Describes significant recent stressors/losses.  71 year old daughter died earlier this year.  Other members of his extended family have also recently passed away.  Had recently relapsed on cocaine after period of sobriety. Today patient reports persistent symptoms, although he does acknowledge some improvement compared to admission.  Presents calm and pleasant on approach describes ongoing depression (although partially better) and intermittent auditory hallucinations, mostly at nighttime.  Reports hearing voices which are demeaning and telling her he should die.  Currently he does not appear internally preoccupied.  No delusions are expressed.  He presents future oriented and states that he has been trying to get back to [facility], in [place], where he had been residing prior to this admission.  States that due to Covid epidemic it is unclear whether he can return there at this time.  Is looking at other shelter options. At this time denies medication side effects.. Vitals are stable BP 110/77, pulse 93 Plan is to continue current regimen-increase Cymbalta to 40 mg daily, continue Zyprexa 5 mg nightly, continue Ativan as needed for anxiety as needed.  On Terazosin/amlodipine for hypertension Treatment team working on disposition planning options Sallyanne Havers, MD

## 2019-08-10 NOTE — Progress Notes (Signed)
Patient shares with CSW that he is unsure about returning to [facility] due to unprofessional behaviors from their staff. CSW provided patient with [place] resources a second time, encouraged patient to call [facility] to secure a shelter bed.  Patient called and left a voicemail with his code and call back information.  Stephanie Acre, MSW, [place] Social Worker [facility]  [phone]

## 2019-08-10 NOTE — Progress Notes (Signed)
Adult Psychoeducational Group Note  Date:  08/10/2019 Time:  12:36 PM  Group Topic/Focus:  Identifying Needs:   The focus of this group is to help patients identify their personal needs that have been historically problematic and identify healthy behaviors to address their needs.  Participation Level:  Did Not Attend   Pt was notified that group with the [facility] is be starting. Pt chose not to come to group.  Lita Mains 08/10/2019, 12:36 PM

## 2019-08-10 NOTE — Progress Notes (Signed)
Psychoeducational Group Note  Date:  08/10/2019 Time:  0126  Group Topic/Focus:  Wrap-Up Group:   The focus of this group is to help patients review their daily goal of treatment and discuss progress on daily workbooks.  Participation Level: Did Not Attend  Participation Quality:  Not Applicable  Affect:  Not Applicable  Cognitive:  Not Applicable  Insight:  Not Applicable  Engagement in Group: Not Applicable  Additional Comments:  The patient did not attend group last evening.   Afnan Cadiente S 08/10/2019, 1:26 AM

## 2019-08-10 NOTE — Progress Notes (Signed)
Patient denies SI, HI and AVH this shift.  Patient does still endorse depressive symptoms but reports them getting better.  Patient was pleasant and smiling during conversation.   Assess patient for safety, offer medications as prescribed, engage patient in 1:1 staff talks.   Continue to monitor as planned.

## 2019-08-10 NOTE — Progress Notes (Signed)
   08/10/19 2200  Psych Admission Type (Psych Patients Only)  Admission Status Voluntary  Psychosocial Assessment  Patient Complaints Anxiety  Eye Contact Fair  Facial Expression Anxious;Sullen;Sad;Worried  Affect Anxious;Depressed;Preoccupied;Sad;Sullen  Theatre stage manager;Restless;Slow  Appearance/Hygiene Unremarkable  Behavior Characteristics Cooperative  Mood Depressed;Anxious  Aggressive Behavior  Effect No apparent injury  Thought Process  Coherency WDL  Content WDL  Delusions None reported or observed  Perception WDL  Hallucination None reported or observed  Judgment Poor  Confusion None  Danger to Self  Current suicidal ideation? Passive  Self-Injurious Behavior Some self-injurious ideation observed or expressed.  No lethal plan expressed   Agreement Not to Harm Self Yes  Description of Agreement contracts in hospital  Danger to Others  Danger to Others None reported or observed

## 2019-08-11 LAB — LIPID PANEL
Cholesterol: 182 mg/dL (ref 0–200)
HDL: 39 mg/dL — ABNORMAL LOW (ref >40)
LDL Cholesterol: 100 mg/dL — ABNORMAL HIGH (ref 0–99)
Total CHOL/HDL Ratio: 4.7 RATIO
Triglycerides: 215 mg/dL — ABNORMAL HIGH (ref <150)
VLDL: 43 mg/dL — ABNORMAL HIGH (ref 0–40)

## 2019-08-11 LAB — HEMOGLOBIN A1C
Hgb A1c MFr Bld: 5.2 % (ref 4.8–5.6)
Mean Plasma Glucose: 102.54 mg/dL

## 2019-08-11 MED ORDER — QUETIAPINE FUMARATE 50 MG PO TABS
50.0000 mg | ORAL_TABLET | Freq: Every day | ORAL | Status: DC
  Administered 2019-08-11 – 2019-08-12 (×2): 50 mg via ORAL
  Filled 2019-08-11 (×4): qty 1

## 2019-08-11 NOTE — Progress Notes (Addendum)
[facility] MD Progress Note  08/11/2019 8:59 AM Paul Blair  MRN:  [id number]   Subjective:  Paul Blair stated " This is the third night that I have been up, this medication is not working.?"  Evaluation: Paul Blair observed resting in bed.  He is awake, alert and oriented x3.  Denying homicidal or suicidal ideations.  Denies auditory or visual hallucinations. I have " racing thoughts, more than anything."  Reported he was awaiting the social worker to find placement as he states his current residential facility has a "Covid outbreak" and he is unable to return at this time.  Reports he has been seeking alternative options.  Patient reported making multiple phone calls for housing on yesterday.  Case staffed with attending psychiatrist will discontinue Zyprexa 5 mg initiate Seroquel 50 mg p.o. nightly. Labs orders placed for A1C, Lipid panel, prolactin and EKG- pending results  Staff to continue to monitor for safety.  Support, encouragement and reassurance was provided.  Principal Problem: Major depressive disorder, recurrent episode, severe (HCC) Diagnosis: Principal Problem:   Major depressive disorder, recurrent episode, severe (HCC) Active Problems:   MDD (major depressive disorder), recurrent, severe, with psychosis (HCC)   Major depressive disorder, single episode, severe (HCC)  Total Time spent with patient: 20 minutes  Past Psychiatric History: Patient stated that he had 1 previous psychiatric admission many years ago.  He was followed as an outpatient but he was at a community clinic.  He stated he received multiple medications, and most of those were samples because he had no resources at the time.  He does not recall many of their names.  Past Medical History:  Past Medical History:  Diagnosis Date  . Cocaine abuse (HCC)   . Depression   . History of ETOH abuse   . Homicidal ideations   . Hypertension   . Liver cirrhosis (HCC)   . Substance induced mood disorder (HCC)   . Suicidal  ideations    History reviewed. No pertinent surgical history. Family History: History reviewed. No pertinent family history. Family Psychiatric  History: See admission H&P Social History:  Social History   Substance and Sexual Activity  Alcohol Use Yes   Comment: hx abuse- incarcerated at present     Social History   Substance and Sexual Activity  Drug Use Yes  . Types: Cocaine, Marijuana    Social History   Socioeconomic History  . Marital status: Divorced    Spouse name: Not on file  . Number of children: Not on file  . Years of education: Not on file  . Highest education level: Not on file  Occupational History  . Not on file  Social Needs  . Financial resource strain: Not on file  . Food insecurity    Worry: Not on file    Inability: Not on file  . Transportation needs    Medical: Not on file    Non-medical: Not on file  Tobacco Use  . Smoking status: Current Every Day Smoker  . Smokeless tobacco: Never Used  Substance and Sexual Activity  . Alcohol use: Yes    Comment: hx abuse- incarcerated at present  . Drug use: Yes    Types: Cocaine, Marijuana  . Sexual activity: Not on file  Lifestyle  . Physical activity    Days per week: Not on file    Minutes per session: Not on file  . Stress: Not on file  Relationships  . Social Musician on phone: Not  on file    Gets together: Not on file    Attends religious service: Not on file    Active member of club or organization: Not on file    Attends meetings of clubs or organizations: Not on file    Relationship status: Not on file  Other Topics Concern  . Not on file  Social History Narrative  . Not on file   Additional Social History:                         Sleep: Poor  Appetite:  Good  Current Medications: Current Facility-Administered Medications  Medication Dose Route Frequency Provider Last Rate Last Dose  . acetaminophen (TYLENOL) tablet 650 mg  650 mg Oral Q6H PRN Sharma Covert, MD      . alum & mag hydroxide-simeth (MAALOX/MYLANTA) 200-200-20 MG/5ML suspension 30 mL  30 mL Oral Q4H PRN Sharma Covert, MD      . amLODipine (NORVASC) tablet 10 mg  10 mg Oral Daily Sharma Covert, MD   10 mg at 08/11/19 0850  . calcium carbonate (TUMS - dosed in mg elemental calcium) chewable tablet 200 mg of elemental calcium  200 mg of elemental calcium Oral TID PRN Lindon Romp A, NP      . cyclobenzaprine (FLEXERIL) tablet 5 mg  5 mg Oral TID PRN Sharma Covert, MD   5 mg at 08/10/19 0751  . DULoxetine (CYMBALTA) DR capsule 40 mg  40 mg Oral Daily Regie Bunner, Myer Peer, MD   40 mg at 08/11/19 0851  . fluticasone (FLONASE) 50 MCG/ACT nasal spray 2 spray  2 spray Each Nare Daily Sharma Covert, MD   2 spray at 08/11/19 [phone]  . LORazepam (ATIVAN) tablet 0.5 mg  0.5 mg Oral TID PRN Connye Burkitt, NP   0.5 mg at 08/10/19 1714  . magnesium hydroxide (MILK OF MAGNESIA) suspension 30 mL  30 mL Oral Daily PRN Sharma Covert, MD      . naproxen (NAPROSYN) tablet 375 mg  375 mg Oral BID PRN Sharma Covert, MD   375 mg at 08/11/19 0853  . OLANZapine (ZYPREXA) tablet 5 mg  5 mg Oral QHS Connye Burkitt, NP   5 mg at 08/10/19 2132  . pantoprazole (PROTONIX) EC tablet 40 mg  40 mg Oral Daily Thomes Burak, Myer Peer, MD   40 mg at 08/11/19 0850  . terazosin (HYTRIN) capsule 1 mg  1 mg Oral QHS Sharma Covert, MD   1 mg at 08/10/19 2132    Lab Results: No results found for this or any previous visit (from the past 88 hour(s)).  Blood Alcohol level:  Lab Results  Component Value Date   ETH <10 08/04/2019   ETH <5 30/86/5784    Metabolic Disorder Labs: No results found for: HGBA1C, MPG No results found for: PROLACTIN No results found for: CHOL, TRIG, HDL, CHOLHDL, VLDL, LDLCALC  Physical Findings: AIMS: Facial and Oral Movements Muscles of Facial Expression: None, normal Lips and Perioral Area: None, normal Jaw: None, normal Tongue: None, normal,Extremity  Movements Upper (arms, wrists, hands, fingers): None, normal Lower (legs, knees, ankles, toes): None, normal, Trunk Movements Neck, shoulders, hips: None, normal, Overall Severity Severity of abnormal movements (highest score from questions above): None, normal Incapacitation due to abnormal movements: None, normal Patient's awareness of abnormal movements (rate only patient's report): No Awareness, Dental Status Current problems with teeth and/or dentures?: No Does  patient usually wear dentures?: No  CIWA:  CIWA-Ar Total: 1 COWS:  COWS Total Score: 2  Musculoskeletal: Strength & Muscle Tone: within normal limits Gait & Station: normal Patient leans: N/A  Psychiatric Specialty Exam: Physical Exam  Nursing note and vitals reviewed. Constitutional: He is oriented to person, place, and time. He appears well-developed and well-nourished.  Cardiovascular: Normal rate.  Respiratory: Effort normal.  Neurological: He is alert and oriented to person, place, and time.  Psychiatric: He has a normal mood and affect. His behavior is normal.    Review of Systems  Psychiatric/Behavioral: Positive for depression and substance abuse (cocaine). Negative for hallucinations and suicidal ideas. The patient is nervous/anxious and has insomnia.   All other systems reviewed and are negative.   Blood pressure 115/90, pulse 97, temperature (!) 97.2 F (36.2 C), temperature source Oral, resp. rate 16, height 6\' 1"  (1.854 m), weight 111.6 kg, SpO2 96 %.Body mass index is 32.46 kg/m.  General Appearance: Casual  Eye Contact:  Good  Speech:  Normal Rate  Volume:  Normal  Mood:  Anxious  Affect:  Congruent  Thought Process:  Coherent  Orientation:  Full (Time, Place, and Person)  Thought Content:  Logical  Suicidal Thoughts:  No  Homicidal Thoughts:  No  Memory:  Immediate;   Good Recent;   Good  Judgement:  Intact  Insight:  Fair  Psychomotor Activity:  Normal  Concentration:  Concentration: Good  and Attention Span: Fair  Recall:  Good  Fund of Knowledge:  Fair  Language:  Good  Akathisia:  No  Handed:  Right  AIMS (if indicated):     Assets:  Communication Skills Desire for Improvement Financial Resources/Insurance Resilience  ADL's:  Intact  Cognition:  WNL  Sleep:  Number of Hours: 6.5     Treatment Plan Summary: Daily contact with patient to assess and evaluate symptoms and progress in treatment and Medication management   Continue with current treatment plan on 08/11/2019 as listed below except were noted.  Discontinue Zyprexa 5 mg PO QHS for AH/insomnia  Initiated  Seroqual 50 mg po QHS - Lipid panel, A1C , Prolactin and EKG- pending  Continue Ativan 0.5 mg PO TID PRN anxiety Continue Cymbalta 30 mg PO daily for mood/anxiety Continue Norvasc 10 mg PO daily for HTN Continue Flexeril 5 mg PO TID PRN muscle spasms Continue Protonix 40 mg PO daily for GERD Continue Klor-Con 10 mEq PO BID for 3 doses for hypokalemia Continue terazosin 1 mg PO QHS for BPH  Patient will participate in the therapeutic group milieu. Discharge disposition in progress.   Oneta Rackanika N Lewis, NP 08/11/2019, 8:59 AM   Agree with NP Progress Note

## 2019-08-11 NOTE — Progress Notes (Signed)
[place] NOVEL CORONAVIRUS (COVID-19) DAILY CHECK-OFF SYMPTOMS - answer yes or no to each - every day NO YES  Have you had a fever in the past 24 hours?  . Fever (Temp > 37.80C / 100F) X   Have you had any of these symptoms in the past 24 hours? . New Cough .  Sore Throat  .  Shortness of Breath .  Difficulty Breathing .  Unexplained Body Aches   X   Have you had any one of these symptoms in the past 24 hours not related to allergies?   . Runny Nose .  Nasal Congestion .  Sneezing   X   If you have had runny nose, nasal congestion, sneezing in the past 24 hours, has it worsened?  X   EXPOSURES - check yes or no X   Have you traveled outside the state in the past 14 days?  X   Have you been in contact with someone with a confirmed diagnosis of COVID-19 or PUI in the past 14 days without wearing appropriate PPE?  X   Have you been living in the same home as a person with confirmed diagnosis of COVID-19 or a PUI (household contact)?    X   Have you been diagnosed with COVID-19?    X              What to do next: Answered NO to all: Answered YES to anything:   Proceed with unit schedule Follow the [facility] Inpatient Flowsheet.   

## 2019-08-11 NOTE — Progress Notes (Signed)
Patient denies SI, HI and AVH this shift.   Patient has had a pleasant mood this shift.  Patient I compliant with medications and engaged in groups activities.    Assess patient for safety, offer medications as prescribed, engage patient in 1:1 staff talks.   Continue to monitor patient as planned. Patient able to contract for safety.

## 2019-08-11 NOTE — Progress Notes (Signed)
   08/11/19 1700  Psych Admission Type (Psych Patients Only)  Admission Status Voluntary  Psychosocial Assessment  Patient Complaints Anxiety  Eye Contact Fair  Facial Expression Anxious;Sullen;Sad;Worried  Affect Anxious;Depressed;Preoccupied;Sad;Sullen  Theatre stage manager;Restless;Slow  Appearance/Hygiene Unremarkable  Behavior Characteristics Cooperative  Mood Depressed;Anxious  Aggressive Behavior  Effect No apparent injury  Thought Process  Coherency WDL  Content WDL  Delusions None reported or observed  Perception WDL  Hallucination None reported or observed  Judgment Poor  Confusion None  Danger to Self  Current suicidal ideation? Denies  Self-Injurious Behavior Some self-injurious ideation observed or expressed.  No lethal plan expressed   Agreement Not to Harm Self Yes  Description of Agreement contracts in hospital  Danger to Others  Danger to Others None reported or observed

## 2019-08-11 NOTE — [facility] Group Notes (Signed)
[address] Group Notes:  (Nursing/MHT/Case Management/Adjunct)  Date:  08/11/2019  Time:  9:13 PM  Type of Therapy:  Wrap Up Group  Participation Level:  Active  Participation Quality:  Appropriate  Affect:  Appropriate  Cognitive:  Appropriate  Insight:  Appropriate  Engagement in Group:  Engaged  Modes of Intervention:  Discussion  Summary of Progress/Problems:  Patient reports losing 3 kids and still has 7 that are alive.  Patient states, "It's hard to lose your kids.  I don't know whether I'm going or coming some times.  Today has been different for me.  I've been talking more, laughing, and being silly."  Scherrie November 08/11/2019, 9:13 PM

## 2019-08-12 LAB — PROLACTIN: Prolactin: 18.7 ng/mL — ABNORMAL HIGH (ref 4.0–15.2)

## 2019-08-12 NOTE — Progress Notes (Signed)
   08/12/19 2120  Psych Admission Type (Psych Patients Only)  Admission Status Voluntary  Psychosocial Assessment  Patient Complaints Anxiety  Eye Contact Fair  Facial Expression Animated  Affect Appropriate to circumstance  Speech Logical/coherent  Interaction Assertive  Motor Activity Slow  Appearance/Hygiene Unremarkable  Behavior Characteristics Cooperative;Appropriate to situation;Anxious  Mood Anxious;Pleasant  Thought Process  Coherency WDL  Content WDL  Delusions None reported or observed  Perception WDL  Hallucination None reported or observed  Judgment WDL  Confusion None  Danger to Self  Current suicidal ideation? Denies

## 2019-08-12 NOTE — Progress Notes (Signed)
[facility] MD Progress Note  08/12/2019 1:42 PM Ozias Dicenzo  MRN:  [id number]   Subjective: Patient reports he is feeling better than on admission.  States "somebody (referring to appear) even made a comment on how much better I looked".  He denies suicidal ideations at this time.  Does not endorse medication side effects Objective: I have reviewed chart notes and met with patient 53 year old male, history of depression, anxiety, cocaine use disorder.  Presented to ED on 11/20 reporting depression, suicidal ideations, recent relapse on cocaine.  He had recently found out one of his daughters had passed away.   Today patient presents alert, attentive, pleasant/polite on approach, calm, without psychomotor agitation or restlessness.  He acknowledges gradual/ongoing improvement compared to admission.  Today denies SI and presents future oriented.  As he improves he is focusing more on discharge planning.  Currently his plan is to [facility] where he resides at [facility], a residential setting. He is currently denying hallucinations and does not appear internally preoccupied at present.  No delusions are expressed. Tolerating medications well, and states he feels "a lot better" after Seroquel was added to treatment regimen.  Reports he slept better last night. Visible in dayroom, behavior in good control, no disruptive or agitated behaviors. Labs reviewed.  11/26 mean plasma glucose 102, hemoglobin A1c 5.2, lipid panel remarkable for mildly elevated triglycerides versus 215.  Total cholesterol 182  Principal Problem: Major depressive disorder, recurrent episode, severe (HCC) Diagnosis: Principal Problem:   Major depressive disorder, recurrent episode, severe (HCC) Active Problems:   MDD (major depressive disorder), recurrent, severe, with psychosis ([place])   Major depressive disorder, single episode, severe ([place])  Total Time spent with patient: 15 minutes  Past Psychiatric History:  Patient stated that he had 1 previous psychiatric admission many years ago.  He was followed as an outpatient but he was at a community clinic.  He stated he received multiple medications, and most of those were samples because he had no resources at the time.  He does not recall many of their names.  Past Medical History:  Past Medical History:  Diagnosis Date  . Cocaine abuse ([place])   . Depression   . History of ETOH abuse   . Homicidal ideations   . Hypertension   . Liver cirrhosis ([place])   . Substance induced mood disorder ([place])   . Suicidal ideations    History reviewed. No pertinent surgical history. Family History: History reviewed. No pertinent family history. Family Psychiatric  History: See admission H&P Social History:  Social History   Substance and Sexual Activity  Alcohol Use Yes   Comment: hx abuse- incarcerated at present     Social History   Substance and Sexual Activity  Drug Use Yes  . Types: Cocaine, Marijuana    Social History   Socioeconomic History  . Marital status: Divorced    Spouse name: Not on file  . Number of children: Not on file  . Years of education: Not on file  . Highest education level: Not on file  Occupational History  . Not on file  Social Needs  . Financial resource strain: Not on file  . Food insecurity    Worry: Not on file    Inability: Not on file  . Transportation needs    Medical: Not on file    Non-medical: Not on file  Tobacco Use  . Smoking status: Current Every Day Smoker  . Smokeless tobacco: Never Used  Substance and  Sexual Activity  . Alcohol use: Yes    Comment: hx abuse- incarcerated at present  . Drug use: Yes    Types: Cocaine, Marijuana  . Sexual activity: Not on file  Lifestyle  . Physical activity    Days per week: Not on file    Minutes per session: Not on file  . Stress: Not on file  Relationships  . Social Herbalist on phone: Not on file    Gets together: Not on file    Attends  religious service: Not on file    Active member of club or organization: Not on file    Attends meetings of clubs or organizations: Not on file    Relationship status: Not on file  Other Topics Concern  . Not on file  Social History Narrative  . Not on file   Additional Social History:   Sleep: Improved, states he slept better last night  Appetite:  Good  Current Medications: Current Facility-Administered Medications  Medication Dose Route Frequency Provider Last Rate Last Dose  . acetaminophen (TYLENOL) tablet 650 mg  650 mg Oral Q6H PRN Sharma Covert, MD      . alum & mag hydroxide-simeth (MAALOX/MYLANTA) 200-200-20 MG/5ML suspension 30 mL  30 mL Oral Q4H PRN Sharma Covert, MD      . amLODipine (NORVASC) tablet 10 mg  10 mg Oral Daily Sharma Covert, MD   10 mg at 08/12/19 0815  . calcium carbonate (TUMS - dosed in mg elemental calcium) chewable tablet 200 mg of elemental calcium  200 mg of elemental calcium Oral TID PRN Lindon Romp A, NP      . cyclobenzaprine (FLEXERIL) tablet 5 mg  5 mg Oral TID PRN Sharma Covert, MD   5 mg at 08/10/19 0751  . DULoxetine (CYMBALTA) DR capsule 40 mg  40 mg Oral Daily Michaelpaul Apo, Myer Peer, MD   40 mg at 08/12/19 0815  . fluticasone (FLONASE) 50 MCG/ACT nasal spray 2 spray  2 spray Each Nare Daily Sharma Covert, MD   2 spray at 08/12/19 [phone]  . LORazepam (ATIVAN) tablet 0.5 mg  0.5 mg Oral TID PRN Connye Burkitt, NP   0.5 mg at 08/10/19 1714  . magnesium hydroxide (MILK OF MAGNESIA) suspension 30 mL  30 mL Oral Daily PRN Sharma Covert, MD      . naproxen (NAPROSYN) tablet 375 mg  375 mg Oral BID PRN Sharma Covert, MD   375 mg at 08/11/19 0853  . pantoprazole (PROTONIX) EC tablet 40 mg  40 mg Oral Daily Taralee Marcus, Myer Peer, MD   40 mg at 08/12/19 0815  . QUEtiapine (SEROQUEL) tablet 50 mg  50 mg Oral QHS Derrill Center, NP   50 mg at 08/11/19 2141  . terazosin (HYTRIN) capsule 1 mg  1 mg Oral QHS Sharma Covert, MD    1 mg at 08/11/19 2141    Lab Results:  Results for orders placed or performed during the hospital encounter of 08/05/19 (from the past 48 hour(s))  Prolactin     Status: Abnormal   Collection Time: 08/11/19  6:18 PM  Result Value Ref Range   Prolactin 18.7 (H) 4.0 - 15.2 ng/mL    Comment: (NOTE) Performed At: [facility] [place], [place] [id number] Rush Farmer MD OX:[id number]   Hemoglobin A1c     Status: None   Collection Time: 08/11/19  6:18 PM  Result Value Ref  Range   Hgb A1c MFr Bld 5.2 4.8 - 5.6 %    Comment: (NOTE) Pre diabetes:          5.7%-6.4% Diabetes:              >6.4% Glycemic control for   <7.0% adults with diabetes    Mean Plasma Glucose 102.54 mg/dL    Comment: Performed at [facility] [address] [place], [place] [address]  Lipid panel     Status: Abnormal   Collection Time: 08/11/19  6:18 PM  Result Value Ref Range   Cholesterol 182 0 - 200 mg/dL   Triglycerides 215 (H) <150 mg/dL   HDL 39 (L) >40 mg/dL   Total CHOL/HDL Ratio 4.7 RATIO   VLDL 43 (H) 0 - 40 mg/dL   LDL Cholesterol 100 (H) 0 - 99 mg/dL    Comment:        Total Cholesterol/HDL:CHD Risk Coronary Heart Disease Risk Table                     Men   Women  1/2 Average Risk   3.4   3.3  Average Risk       5.0   4.4  2 X Average Risk   9.6   7.1  3 X Average Risk  23.4   11.0        Use the calculated Patient Ratio above and the CHD Risk Table to determine the patient's CHD Risk.        ATP III CLASSIFICATION (LDL):  <100     mg/dL   Optimal  100-129  mg/dL   Near or Above                    Optimal  130-159  mg/dL   Borderline  160-189  mg/dL   High  >190     mg/dL   Very High Performed at [facility] [address] [place], [place] [address]     Blood Alcohol level:  Lab Results  Component Value Date   [facility] <10 08/04/2019   ETH <5 00/92/3300    Metabolic Disorder Labs: Lab Results  Component Value Date   HGBA1C  5.2 08/11/2019   MPG 102.54 08/11/2019   Lab Results  Component Value Date   PROLACTIN 18.7 (H) 08/11/2019   Lab Results  Component Value Date   CHOL 182 08/11/2019   TRIG 215 (H) 08/11/2019   HDL 39 (L) 08/11/2019   CHOLHDL 4.7 08/11/2019   VLDL 43 (H) 08/11/2019   LDLCALC 100 (H) 08/11/2019    Physical Findings: AIMS: Facial and Oral Movements Muscles of Facial Expression: None, normal Lips and Perioral Area: None, normal Jaw: None, normal Tongue: None, normal,Extremity Movements Upper (arms, wrists, hands, fingers): None, normal Lower (legs, knees, ankles, toes): None, normal, Trunk Movements Neck, shoulders, hips: None, normal, Overall Severity Severity of abnormal movements (highest score from questions above): None, normal Incapacitation due to abnormal movements: None, normal Patient's awareness of abnormal movements (rate only patient's report): No Awareness, Dental Status Current problems with teeth and/or dentures?: No Does patient usually wear dentures?: No  CIWA:  CIWA-Ar Total: 1 COWS:  COWS Total Score: 2  Musculoskeletal: Strength & Muscle Tone: within normal limits Gait & Station: normal Patient leans: N/A  Psychiatric Specialty Exam: Physical Exam  Nursing note and vitals reviewed. Constitutional: He is oriented to person, place, and time. He appears well-developed and well-nourished.  Cardiovascular: Normal rate.  Respiratory: Effort normal.  Neurological: He is alert and oriented to person, place, and time.  Psychiatric: He has a normal mood and affect. His behavior is normal.    Review of Systems  Psychiatric/Behavioral: Positive for depression and substance abuse (cocaine). Negative for hallucinations and suicidal ideas. The patient is nervous/anxious and has insomnia.   All other systems reviewed and are negative. Denies chest pain or shortness of breath, no cough, no vomiting  Blood pressure (!) 105/97, pulse 95, temperature 97.7 F (36.5  C), temperature source Oral, resp. rate 20, height '6\' 1"'  (1.854 m), weight 111.6 kg, SpO2 96 %.Body mass index is 32.46 kg/m.  General Appearance: Casual  Eye Contact:  Good  Speech:  Normal Rate  Volume:  Normal  Mood:  Improving, acknowledges he is feeling better today  Affect:  Appropriate and Toy [place] in range  Thought Process:  Linear and Descriptions of Associations: Intact  Orientation:  Full (Time, Place, and Person)  Thought Content:  At this time denies any active auditory hallucinations and does not appear internally preoccupied, no delusions are expressed  Suicidal Thoughts:  No currently denies suicidal or self-injurious ideations  Homicidal Thoughts:  No  Memory:  Recent and remote grossly intact  Judgement:  Intact  Insight:  Fair/improving  Psychomotor Activity:  Normal-no psychomotor agitation or restlessness, presents calm/comfortable at this time  Concentration:  Concentration: Good and Attention Span: Good  Recall:  Good  Fund of Knowledge:  Good  Language:  Good  Akathisia:  No  Handed:  Right  AIMS (if indicated):     Assets:  Communication Skills Desire for Improvement Financial Resources/Insurance Resilience  ADL's:  Intact  Cognition:  WNL  Sleep:  Number of Hours: 6.5   Assessment:  53 year old male, history of depression, anxiety, cocaine use disorder.  Presented to ED on 11/20 reporting depression, suicidal ideations, recent relapse on cocaine.  He had recently found out one of his daughters had passed away.   Currently patient is presenting with improving mood, full range of affect.  At this time denies SI and presents future oriented, focusing more on disposition planning.  Currently not internally preoccupied/does not present with psychosis or thought disorder.  Was started on Seroquel for augmentation, insomnia.  States he slept better last night and has tolerated well thus far.  We have reviewed side effect profile  Treatment Plan Summary: Daily  contact with patient to assess and evaluate symptoms and progress in treatment and Medication management  Treatment plan reviewed as below today 11/27 Encourage group and milieu participation Encourage efforts to work on sobriety/abstinence Treatment team working on disposition planning options-patient plans to return to [facility] in [place] 50 mg po QHS for mood disorder, augmentation, insomnia  Continue Ativan 0.5 mg PO TID PRN anxiety Continue Cymbalta 30 mg PO daily for mood/anxiety Continue Norvasc 10 mg PO daily, Hytrin 1 mg p.o. daily for HTN Continue Flexeril 5 mg PO TID PRN muscle spasms Continue Protonix 40 mg PO daily for GERD   Jenne Campus, MD 08/12/2019, 1:42 PM    Patient ID: Pincus Large, male   DOB: 25-Jan-1966, 53 y.o.   MRN: [id number]

## 2019-08-12 NOTE — Plan of Care (Signed)
Progress note  D: pt found in bed; compliant with medication administration. Pt denies any pain. Pt does state their sleeping medications are working well, even causing them to be groggy in the morning. Pt isn't concerned. Pt is increasingly animated and appropriate. Pt is focused on discharge and follow up care. Pt denies si/hi/ah/vh and verbally agrees to approach staff if these become apparent or before harming themself/others while at [place].  A: Pt provided support and encouragement. Pt given medication per protocol and standing orders. Q13m safety checks implemented and continued.  R: Pt safe on the unit. Will continue to monitor.  Pt progressing in the following metrics  Problem: Medication: Goal: Compliance with prescribed medication regimen will improve Outcome: Progressing   Problem: Self-Concept: Goal: Ability to disclose and discuss suicidal ideas will improve Outcome: Progressing Goal: Will verbalize positive feelings about self Outcome: Progressing

## 2019-08-12 NOTE — Progress Notes (Signed)
Recreation Therapy Notes  Date:  11.27.20 Time: 0930 Location: 300 Hall Dayroom  Group Topic: Stress Management  Goal Area(s) Addresses:  Patient will identify positive stress management techniques. Patient will identify benefits of using stress management post d/c.  Behavioral Response:  Engaged  Intervention: Stress Management  Activity :  Progressive Muscle Relaxation.  LRT introduced the stress management technique of progressive muscle relaxation.  LRT read a script that guided patients in tensing then relaxing each muscle group.  Patients were to follow along as script was read to engage in activity.  Education:  Stress Management, Discharge Planning.   Education Outcome: Acknowledges Education  Clinical Observations/Feedback: Pt attended and participated in activity.     Victorino Sparrow, LRT/CTRS         Victorino Sparrow A 08/12/2019 11:27 AM

## 2019-08-12 NOTE — Progress Notes (Signed)
   08/12/19 0117  Psych Admission Type (Psych Patients Only)  Admission Status Voluntary  Psychosocial Assessment  Patient Complaints Anxiety  Eye Contact Fair  Facial Expression Anxious;Sullen;Sad;Worried  Affect Anxious;Depressed;Preoccupied;Sad;Sullen  Speech Logical/coherent  Interaction Assertive  Motor Activity Other (Comment) (wdl)  Appearance/Hygiene Unremarkable  Behavior Characteristics Cooperative  Mood Depressed  Aggressive Behavior  Effect No apparent injury  Thought Process  Coherency WDL  Content WDL  Delusions None reported or observed  Perception WDL  Hallucination None reported or observed  Judgment Poor  Confusion None  Danger to Self  Current suicidal ideation? Denies  Self-Injurious Behavior Some self-injurious ideation observed or expressed.  No lethal plan expressed   Agreement Not to Harm Self Yes  Description of Agreement contracts in hospital  Danger to Others  Danger to Others None reported or observed  D: Patient in dayroom reports he had a good day. Pt reports he is relying on his faith to get through this difficult time. A: Medications administered as prescribed. Support and encouragement provided as needed.  R: Patient remains safe on the unit. Will continue to monitor for safety and stability.

## 2019-08-13 MED ORDER — PANTOPRAZOLE SODIUM 40 MG PO TBEC: 40.0000 mg | DELAYED_RELEASE_TABLET | Freq: Every day | ORAL | 0 refills | Status: DC

## 2019-08-13 MED ORDER — DULOXETINE HCL 40 MG PO CPEP: 40.0000 mg | ORAL_CAPSULE | Freq: Every day | ORAL | 0 refills | Status: DC

## 2019-08-13 MED ORDER — TERAZOSIN HCL 1 MG PO CAPS: 1.0000 mg | ORAL_CAPSULE | Freq: Every day | ORAL | 0 refills | Status: DC

## 2019-08-13 MED ORDER — AMLODIPINE BESYLATE 10 MG PO TABS: 10.0000 mg | ORAL_TABLET | Freq: Every day | ORAL | 0 refills | Status: DC

## 2019-08-13 MED ORDER — QUETIAPINE FUMARATE 50 MG PO TABS: 50.0000 mg | ORAL_TABLET | Freq: Every day | ORAL | 0 refills | Status: DC

## 2019-08-13 NOTE — [facility] Group Notes (Signed)
[place] LCSW Group Therapy Note  Date/Time:    08/13/2019 10:00-11:00AM  Type of Therapy and Topic:  Group Therapy:  Practicing Kindness to Self  Participation Level:  Active   Description of Group:  The focus of this group is to examine human tendencies to be hyper-critical of self and how this leads to feelings of worthlessness, hopelessness, and shame.  Patients were guided to the concept that shame is universal and is worsened by being kept hidden, but improved by being revealed.  We discussed how feeling unworthy is the result of shame and discussed the differences between guilt and shame.  It was shared why it is important to build the ability to tolerate discomfort, since numbing emotions is not selective and unfortunately results in numbing both painful and positive feelings.  Multiple exercises were led in a shortened version of several of the skills described.   Therapeutic Goals 1. Identify statements patients automatically say to themselves, "I'll be worthy when...." and examine how this is not kind to self because it indicates we cannot be worthy until some far-reaching goal is achieved. 2. Share current healthy and unhealthy coping skills used when feeling unworthy 3. Practice multiple coping skills including a. Questioning whether they feel guilt ("I did something bad" "I made a mistake" "I did something stupid") or shame ("I am bad" "I am a mistake" "I am stupid") and whether this feeling is based in fact. b. 5 senses mindfulness c. [place] In and Out  (Zooming in highlights our flaws and Zooming Out helps [place] to see ourselves more fully with flaws and assets) d. Grounding  e. AEIOUY (Abstinence, Exercise, I, Others, Unexpressed, Yay) f. TGIF (Trust, Gratitude, Inspiration, [place] or [place]) 4. Encourage to do the needed work to Estate manager/land agent, focusing on how medication is part of the solution to emotional and mental problems, while coping skills are necessary to actually  change behavior.  Summary of Patient Progress: During group, patient expressed that his main shame is that he is himself.  He feels he will be worthy when he gets a sense of humor, because he takes himself too seriously.  He tended once again to be off topic and philosophical about religion, but could be redirected.  He angered one patient while acting as though she was irritating him, although he appeared to be teasing.  He displayed little insight into how his statements and actions affect other patients.   Therapeutic Modalities Processing Lecture Activities   Selmer Dominion, [place] 08/13/2019, 1:53 PM
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
## 2019-08-13 NOTE — Progress Notes (Signed)
[place] NOVEL CORONAVIRUS (COVID-19) DAILY CHECK-OFF SYMPTOMS - answer yes or no to each - every day NO YES  Have you had a fever in the past 24 hours?  . Fever (Temp > 37.80C / 100F) X   Have you had any of these symptoms in the past 24 hours? . New Cough .  Sore Throat  .  Shortness of Breath .  Difficulty Breathing .  Unexplained Body Aches   X   Have you had any one of these symptoms in the past 24 hours not related to allergies?   . Runny Nose .  Nasal Congestion .  Sneezing   X   If you have had runny nose, nasal congestion, sneezing in the past 24 hours, has it worsened?  X   EXPOSURES - check yes or no X   Have you traveled outside the state in the past 14 days?  X   Have you been in contact with someone with a confirmed diagnosis of COVID-19 or PUI in the past 14 days without wearing appropriate PPE?  X   Have you been living in the same home as a person with confirmed diagnosis of COVID-19 or a PUI (household contact)?    X   Have you been diagnosed with COVID-19?    X              What to do next: Answered NO to all: Answered YES to anything:   Proceed with unit schedule Follow the [facility] Inpatient Flowsheet.   

## 2019-08-13 NOTE — BHH Suicide Risk Assessment (Addendum)
[facility] Discharge Suicide Risk Assessment   Principal Problem: Major depressive disorder, recurrent episode, severe ([place]) Discharge Diagnoses: Principal Problem:   Major depressive disorder, recurrent episode, severe ([place]) Active Problems:   MDD (major depressive disorder), recurrent, severe, with psychosis ([place])   Major depressive disorder, single episode, severe ([place])   Total Time spent with patient: 30 min  Musculoskeletal: Strength & Muscle Tone: within normal limits Gait & Station: normal Patient leans: N/A  Psychiatric Specialty Exam: ROS denies chest pain or shortness of breath, no cough, no vomiting  Blood pressure 122/83, pulse 98, temperature (!) 97.5 F (36.4 C), temperature source Oral, resp. rate 20, height 6\' 1"  (1.854 m), weight 111.6 kg, SpO2 96 %.Body mass index is 32.46 kg/m.  General Appearance: Improving grooming  Eye Contact::  Good  Speech:  Normal Rate409  Volume:  Normal  Mood:  Improving mood, reports feeling better  Affect:  More reactive, fuller in range  Thought Process:  Linear and Descriptions of Associations: Intact  Orientation:  Other:  Fully alert and attentive  Thought Content:  No hallucinations, no delusions expressed, does not appear internally preoccupied  Suicidal Thoughts:  No currently denies any suicidal or self-injurious ideations, denies homicidal or violent ideations  Homicidal Thoughts:  No  Memory:  Recent and remote grossly intact  Judgement:  Other:  Improving  Insight:  Improving  Psychomotor Activity:  Normal  Concentration:  Good  Recall:  Good  Fund of Knowledge:Good  Language: Good  Akathisia:  Negative  Handed:  Right  AIMS (if indicated):     Assets:  Desire for Improvement Resilience Social Support  Sleep:  Number of Hours: 6.5  Cognition: WNL  ADL's:  Intact   Mental Status Per Nursing Assessment::   On Admission:  Suicidal ideation indicated by patient  Demographic Factors:  53 year old male  Loss  Factors: Recently found out that adult daughter had passed away, loss of other loved ones in the recent past.  Recently relapsed on cocaine.  Historical Factors: History of depression, history of cocaine use disorder.  Risk Reduction Factors:   Positive coping skills or problem solving skills  Continued Clinical Symptoms:  At this time patient presents alert, attentive, good eye contact, speech normal, mood described as improved and states that he is feeling significantly better than he did on admission.  His affect presents more reactive/full in range.  No thought disorder is noted.  Denies suicidal ideations, denies homicidal or violent ideations, no hallucinations, not internally preoccupied, no delusions expressed, future oriented at this time. Plans to return to [place] where he resides at [facility]. Currently denies medication side effects.  Reviewed side effects including potential motor/metabolic side effects associated with Seroquel. Pleasant on approach, behavior on unit in good control, interacting appropriately with peers.  Cognitive Features That Contribute To Risk:  No gross cognitive deficits noted upon discharge. Is alert , attentive, and oriented x 3   Suicide Risk:  Mild:  Suicidal ideation of limited frequency, intensity, duration, and specificity.  There are no identifiable plans, no associated intent, mild dysphoria and related symptoms, good self-control (both objective and subjective assessment), few other risk factors, and identifiable protective factors, including available and accessible social support.  Follow-up Information    [facility] Follow up.   Why: Referral made. CSW left voicemail requesting appointment .  CSW will contact you Monday-Tuesday with your appointment information. Walk-in hours are Monday-Friday from 8:00am-5:00pm.  Contact information: [address] [place] [address]  [phone]           Plan Of  Care/Follow-up recommendations:  Activity:  As tolerated Diet:  Heart healthy Tests:  NA Other:  See below  Patient is expressing readiness for discharge. Follow up as above .   Craige Cotta, MD 08/13/2019, 12:37 PM

## 2019-08-13 NOTE — Progress Notes (Signed)
Pt discharged to lobby with $13 for [employer] ticket (heading to [facility]) Pt was stable and appreciative at that time. All papers, samples, and prescriptions were given and valuables returned. Verbal understanding expressed. Denies SI/HI and A/VH. Pt given opportunity to express concerns and ask questions.

## 2019-08-13 NOTE — Progress Notes (Addendum)
D. Pt observed in the dayroom interacting well with peers and staff- pt is friendly, smiles upon approach.  Pt currently denies SI/HI and AVH  A. Labs and vitals monitored. Pt compliant with medications. Pt supported emotionally and encouraged to express concerns and ask questions.   R. Pt remains safe with 15 minute checks. Will continue POC.

## 2019-08-13 NOTE — Progress Notes (Addendum)
  [facility] Adult Case Management Discharge Plan :  Will you be returning to the same living situation after discharge:  Yes, states he has talked to the staff at [facility] and can return there. At discharge, do you have transportation home?: Yes,  patient has been given $13 from petty cash to purchase a train ticket ([place]) from [place] to [place].  It leaves at 4:48pm. Do you have the ability to pay for your medications: Yes,  Medicare  Release of information consent forms completed and .Emailed to Medical Records, then turned in to Medical Records by CSW.   Patient to Follow up at: Follow-up Information    [facility] Follow up.   Why: Referral made. CSW left voicemail requesting appointment .  CSW will contact you Monday-Tuesday with your appointment information. Walk-in hours are Monday-Friday from 8:00am-5:00pm.  Contact information: [place]  [address] [phone]           Next level of care provider has access to [place] and Suicide Prevention discussed: No.  Declined  Have you used any form of tobacco in the last 30 days? (Cigarettes, Smokeless Tobacco, Cigars, and/or Pipes): Yes  Has patient been referred to the Quitline?: Patient refused referral  Patient has been referred for addiction treatment: Yes    Maretta Los, LCSW 08/13/2019, 10:12 AM

## 2019-08-19 ENCOUNTER — Encounter (HOSPITAL_COMMUNITY): Payer: Self-pay

## 2019-08-19 ENCOUNTER — Other Ambulatory Visit: Payer: Self-pay

## 2019-08-19 ENCOUNTER — Emergency Department (HOSPITAL_COMMUNITY)
Admission: EM | Admit: 2019-08-19 | Discharge: 2019-08-21 | Disposition: A | Discharge status: Home / Self Care | Payer: Medicare Other | Attending: Emergency Medicine | Admitting: Emergency Medicine

## 2019-08-19 DIAGNOSIS — F332 Major depressive disorder, recurrent severe without psychotic features: Secondary | ICD-10-CM | POA: Insufficient documentation

## 2019-08-19 DIAGNOSIS — R45851 Suicidal ideations: Secondary | ICD-10-CM | POA: Insufficient documentation

## 2019-08-19 DIAGNOSIS — F1020 Alcohol dependence, uncomplicated: Secondary | ICD-10-CM | POA: Insufficient documentation

## 2019-08-19 DIAGNOSIS — Z79899 Other long term (current) drug therapy: Secondary | ICD-10-CM | POA: Diagnosis not present

## 2019-08-19 DIAGNOSIS — F17210 Nicotine dependence, cigarettes, uncomplicated: Secondary | ICD-10-CM | POA: Diagnosis not present

## 2019-08-19 DIAGNOSIS — F1414 Cocaine abuse with cocaine-induced mood disorder: Secondary | ICD-10-CM

## 2019-08-19 DIAGNOSIS — F1910 Other psychoactive substance abuse, uncomplicated: Secondary | ICD-10-CM | POA: Insufficient documentation

## 2019-08-19 DIAGNOSIS — I10 Essential (primary) hypertension: Secondary | ICD-10-CM | POA: Insufficient documentation

## 2019-08-19 DIAGNOSIS — K625 Hemorrhage of anus and rectum: Secondary | ICD-10-CM | POA: Diagnosis present

## 2019-08-19 LAB — CBC
HCT: 49.5 % (ref 39.0–52.0)
Hemoglobin: 16.7 g/dL (ref 13.0–17.0)
MCH: 30.8 pg (ref 26.0–34.0)
MCHC: 33.7 g/dL (ref 30.0–36.0)
MCV: 91.2 fL (ref 80.0–100.0)
Platelets: 260 10*3/uL (ref 150–400)
RBC: 5.43 MIL/uL (ref 4.22–5.81)
RDW: 14.7 % (ref 11.5–15.5)
WBC: 10.4 10*3/uL (ref 4.0–10.5)
nRBC: 0 % (ref 0.0–0.2)

## 2019-08-19 NOTE — ED Triage Notes (Addendum)
Pt coming from home c/o bleeding from catheter that was placed 3 days ago; however, chart states it was placed today at [facility]. Pt also c/o rectal bleeding x3 days that was medically cleared by [facility] today. Reports he is SI/HI which was also cleared by [facility] and also has chills/fever x3 days due to being in contact with someone with covid.

## 2019-08-20 DIAGNOSIS — F1414 Cocaine abuse with cocaine-induced mood disorder: Secondary | ICD-10-CM

## 2019-08-20 DIAGNOSIS — F332 Major depressive disorder, recurrent severe without psychotic features: Secondary | ICD-10-CM | POA: Diagnosis not present

## 2019-08-20 LAB — COMPREHENSIVE METABOLIC PANEL
ALT: 22 U/L (ref 0–44)
AST: 22 U/L (ref 15–41)
Albumin: 4.2 g/dL (ref 3.5–5.0)
Alkaline Phosphatase: 73 U/L (ref 38–126)
Anion gap: 13 (ref 5–15)
BUN: 13 mg/dL (ref 6–20)
CO2: 23 mmol/L (ref 22–32)
Calcium: 9.2 mg/dL (ref 8.9–10.3)
Chloride: 105 mmol/L (ref 98–111)
Creatinine, Ser: 1.19 mg/dL (ref 0.61–1.24)
GFR calc Af Amer: >60 mL/min (ref >60)
GFR calc non Af Amer: >60 mL/min (ref >60)
Glucose, Bld: 107 mg/dL — ABNORMAL HIGH (ref 70–99)
Potassium: 3.1 mmol/L — ABNORMAL LOW (ref 3.5–5.1)
Sodium: 141 mmol/L (ref 135–145)
Total Bilirubin: 1.2 mg/dL (ref 0.3–1.2)
Total Protein: 7.9 g/dL (ref 6.5–8.1)

## 2019-08-20 LAB — SALICYLATE LEVEL: Salicylate Lvl: <7 mg/dL (ref 2.8–30.0)

## 2019-08-20 LAB — RAPID URINE DRUG SCREEN, HOSP PERFORMED
Amphetamines: NOT DETECTED
Barbiturates: NOT DETECTED
Benzodiazepines: NOT DETECTED
Cocaine: POSITIVE — AB
Opiates: NOT DETECTED
Tetrahydrocannabinol: POSITIVE — AB

## 2019-08-20 LAB — ETHANOL: Alcohol, Ethyl (B): <10 mg/dL (ref <10)

## 2019-08-20 LAB — ACETAMINOPHEN LEVEL: Acetaminophen (Tylenol), Serum: <10 ug/mL — ABNORMAL LOW (ref 10–30)

## 2019-08-20 MED ORDER — ACETAMINOPHEN 500 MG PO TABS: 500.0000 mg | ORAL_TABLET | ORAL | Status: DC | PRN

## 2019-08-20 MED ORDER — IBUPROFEN 200 MG PO TABS
600.0000 mg | ORAL_TABLET | Freq: Four times a day (QID) | ORAL | Status: DC | PRN
  Administered 2019-08-20: 600 mg via ORAL
  Filled 2019-08-20: qty 3

## 2019-08-20 NOTE — ED Notes (Signed)
Pt provided breakfast.

## 2019-08-20 NOTE — ED Notes (Addendum)
Pt has 3 bags of belongings at triage nurses station. Pt c/o "front and back" pain. Pt cooperative and calm.

## 2019-08-20 NOTE — Progress Notes (Signed)
08/20/2019  1800  Patient c/o pain with foley catheter. Pt has blood in urine. Notified MD. MD ordered irrigation PRN and Ibuprofen for pain. Irrigated catheter with 69mL and gave Ibuprofen.

## 2019-08-20 NOTE — BH Assessment (Signed)
[facility] Assessment Progress Note   Requested teleassessment machine be put in room.  Has been 10 min.  No machine yet.  Will try in another 10 min.

## 2019-08-20 NOTE — ED Notes (Signed)
tts at bedside 
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
## 2019-08-20 NOTE — Progress Notes (Addendum)
This patient continues to meet inpatient criteria. CSW faxed information to the following facilities:   [facility]- declined, at [facility]  This patient is currently voluntary. [facility] is at capacity.  Stephanie Acre, [place] Social Worker [phone]

## 2019-08-20 NOTE — ED Notes (Signed)
Pt provided 2 Sprites

## 2019-08-20 NOTE — ED Notes (Signed)
Pt ambulatory to restroom to empty catheter.

## 2019-08-20 NOTE — ED Notes (Signed)
Patient complained about burning in the bladder and blood tinge in the catheter. This RN irrigated the [facility] and was able to evaclute some blood clots and instruct patient to lay down to minimize unnecessary irritation/ movements to the his FC.
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
## 2019-08-20 NOTE — ED Notes (Signed)
No bleeding noted from catheter site.

## 2019-08-20 NOTE — ED Provider Notes (Signed)
[facility]-EMERGENCY DEPT Provider Note   CSN: [id number] Arrival date & time: 08/19/19  2256     History   Chief Complaint Chief Complaint  Patient presents with  . bleeding from catheter   . Rectal Bleeding  . Suicidal  . Homicidal    HPI Paul Blair is a 53 y.o. male.     Patient to ED with complaint of rectal bleeding, foley catheter leaking and also for suicidal ideations. He states he had a [facility] for obstruction felt to be prostate enlargement. Since it was placed, urine has been leaking from the bag and soaking his pants. He also reports BRB with bowel movements. No melena or rectal pain.   He has suicidal ideations, stating he will walk into traffic to kill himself. "There's a lot going on that hit all at once." No HI, no hallucinations.   The history is provided by the patient. No language interpreter was used.  Rectal Bleeding Associated symptoms: no fever     Past Medical History:  Diagnosis Date  . Cocaine abuse (HCC)   . Depression   . History of ETOH abuse   . Homicidal ideations   . Hypertension   . Liver cirrhosis (HCC)   . Substance induced mood disorder (HCC)   . Suicidal ideations     Patient Active Problem List   Diagnosis Date Noted  . Major depressive disorder, single episode, severe (HCC) 08/06/2019  . Major depressive disorder, recurrent episode, severe (HCC) 08/06/2019  . MDD (major depressive disorder), recurrent, severe, with psychosis (HCC) 08/05/2019  . Alcohol dependence with uncomplicated withdrawal (HCC) 11/06/2014  . Substance induced mood disorder (HCC) 11/06/2014  . Alcohol intoxication (HCC)   . Depression with suicidal ideation 11/04/2014    History reviewed. No pertinent surgical history.      Home Medications    Prior to Admission medications   Medication Sig Start Date End Date Taking? Authorizing Provider  amLODipine (NORVASC) 10 MG tablet Take 1  tablet (10 mg total) by mouth daily. 08/13/19 09/12/19 Yes Aldean BakerSykes, Janet E, NP  cholecalciferol (VITAMIN D3) 25 MCG (1000 UT) tablet Take 1,000 Units by mouth daily.   Yes [provider]  DULoxetine 40 MG CPEP Take 40 mg by mouth daily. 08/14/19  Yes Aldean BakerSykes, Janet E, NP  fluticasone (FLONASE) 50 MCG/ACT nasal spray Place 1 spray into both nostrils daily as needed for allergies.  10/23/15 07/18/20 Yes [provider]  pantoprazole (PROTONIX) 40 MG tablet Take 1 tablet (40 mg total) by mouth daily. 08/14/19  Yes Aldean BakerSykes, Janet E, NP  QUEtiapine (SEROQUEL) 50 MG tablet Take 1 tablet (50 mg total) by mouth at bedtime. 08/13/19  Yes Aldean BakerSykes, Janet E, NP  terazosin (HYTRIN) 1 MG capsule Take 1 capsule (1 mg total) by mouth at bedtime. 08/13/19  Yes Aldean BakerSykes, Janet E, NP  vitamin B-12 (CYANOCOBALAMIN) 1000 MCG tablet Take 1,000 mcg by mouth daily.   Yes [provider]  vitamin E 200 UNIT capsule Take 200 Units by mouth daily.   Yes [provider]  Zinc Sulfate (ZINC 15 PO) Take 15 mg by mouth daily.   Yes [provider]    Family History No family history on file.  Social History Social History   Tobacco Use  . Smoking status: Current Every Day Smoker  . Smokeless tobacco: Never Used  Substance Use Topics  . Alcohol use: Yes    Comment: hx abuse- incarcerated at  present  . Drug use: Yes    Types: Cocaine, Marijuana     Allergies   Patient has no known allergies.   Review of Systems Review of Systems  Constitutional: Negative for chills and fever.  HENT: Negative.   Respiratory: Negative.   Cardiovascular: Negative.   Gastrointestinal: Positive for blood in stool and hematochezia.  Genitourinary:       See HPI.  Musculoskeletal: Negative.   Skin: Negative.   Neurological: Negative.   Psychiatric/Behavioral: Positive for suicidal ideas. Negative for hallucinations and self-injury.     Physical Exam Updated Vital Signs BP (!) 156/101 (BP  Location: Left Arm)   Pulse 78   Temp 98.8 F (37.1 C) (Oral)   Resp 16   SpO2 99%   Physical Exam Constitutional:      Appearance: He is well-developed.  Neck:     Musculoskeletal: Normal range of motion.  Cardiovascular:     Rate and Rhythm: Normal rate.  Pulmonary:     Effort: Pulmonary effort is normal.  Abdominal:     Tenderness: There is no abdominal tenderness.  Genitourinary:    Comments: Foley in place. No urine leakage from penile meatus. There is urine in the collection bag. No external hemorrhoids visualized. No rectal tenderness, fissures. Musculoskeletal: Normal range of motion.  Skin:    General: Skin is warm and dry.  Neurological:     Mental Status: He is alert and oriented to person, place, and time.      ED Treatments / Results  Labs (all labs ordered are listed, but only abnormal results are displayed) Labs Reviewed  CBC  COMPREHENSIVE METABOLIC PANEL  ETHANOL  SALICYLATE LEVEL  ACETAMINOPHEN LEVEL  RAPID URINE DRUG SCREEN, HOSP PERFORMED   Results for orders placed or performed during the hospital encounter of 08/19/19  Comprehensive metabolic panel  Result Value Ref Range   Sodium 141 135 - 145 mmol/L   Potassium 3.1 (L) 3.5 - 5.1 mmol/L   Chloride 105 98 - 111 mmol/L   CO2 23 22 - 32 mmol/L   Glucose, Bld 107 (H) 70 - 99 mg/dL   BUN 13 6 - 20 mg/dL   Creatinine, Ser 3.24 0.61 - 1.24 mg/dL   Calcium 9.2 8.9 - 40.1 mg/dL   Total Protein 7.9 6.5 - 8.1 g/dL   Albumin 4.2 3.5 - 5.0 g/dL   AST 22 15 - 41 U/L   ALT 22 0 - 44 U/L   Alkaline Phosphatase 73 38 - 126 U/L   Total Bilirubin 1.2 0.3 - 1.2 mg/dL   GFR calc non Af Amer >60 >60 mL/min   GFR calc Af Amer >60 >60 mL/min   Anion gap 13 5 - 15  Ethanol  Result Value Ref Range   Alcohol, Ethyl (B) <10 <10 mg/dL  Salicylate level  Result Value Ref Range   Salicylate Lvl <7.0 2.8 - 30.0 mg/dL  Acetaminophen level  Result Value Ref Range   Acetaminophen (Tylenol), Serum <10 (L) 10 -  30 ug/mL  cbc  Result Value Ref Range   WBC 10.4 4.0 - 10.5 K/uL   RBC 5.43 4.22 - 5.81 MIL/uL   Hemoglobin 16.7 13.0 - 17.0 g/dL   HCT 02.7 25.3 - 66.4 %   MCV 91.2 80.0 - 100.0 fL   MCH 30.8 26.0 - 34.0 pg   MCHC 33.7 30.0 - 36.0 g/dL   RDW 40.3 47.4 - 25.9 %   Platelets 260 150 - 400 K/uL  nRBC 0.0 0.0 - 0.2 %  Rapid urine drug screen (hospital performed)  Result Value Ref Range   Opiates NONE DETECTED NONE DETECTED   Cocaine POSITIVE (A) NONE DETECTED   Benzodiazepines NONE DETECTED NONE DETECTED   Amphetamines NONE DETECTED NONE DETECTED   Tetrahydrocannabinol POSITIVE (A) NONE DETECTED   Barbiturates NONE DETECTED NONE DETECTED    EKG None  Radiology No results found.  Procedures Procedures (including critical care time)  Medications Ordered in ED Medications - No data to display   Initial Impression / Assessment and Plan / ED Course  I have reviewed the triage vital signs and the nursing notes.  Pertinent labs & imaging results that were available during my care of the patient were reviewed by me and considered in my medical decision making (see chart for details).        Patient to ED with multiple complaints including leaking foley catheter, rectal bleeding and SI.   Per chart review the patient was treated and evaluated for all the above at [facility] earlier today.   He was found to be hemoccult positive. This was not repeated here. Will check HGB, monitor VS, however, he appears stable for outpatient GI work up.   Foley in place and functional. No drainage/leaking noted. Will monitor.   He was seen by psychiatry earlier today and felt to be seeking secondary gain of having a place to stay and discharged. On chart review, he has several psychiatric admissions. Will get the opinion of our [facility] team.   Per TTS: patient will be kept under observation and re-evaluated by psychiatry in the morning.  Final Clinical Impressions(s) / ED Diagnoses    Final diagnoses:  None   1. SI 2. Substance abuse  ED Discharge Orders    None       Charlann Lange, Hershal Coria 08/20/19 [place], April, MD 08/20/19 [phone]

## 2019-08-21 MED ORDER — AMLODIPINE BESYLATE 5 MG PO TABS
10.0000 mg | ORAL_TABLET | Freq: Once | ORAL | Status: DC
  Filled 2019-08-21: qty 2

## 2019-08-21 NOTE — ED Provider Notes (Signed)
Emergency Medicine Observation Re-evaluation Note  Paul Blair is a 53 y.o. male, seen on rounds today.  Pt initially presented to the ED for complaints of bleeding from catheter , Rectal Bleeding, Suicidal, and Homicidal Currently, the patient is stable.  Physical Exam  BP (!) 149/97 (BP Location: Left Arm)   Pulse 65   Temp 98.4 F (36.9 C) (Oral)   Resp 18   SpO2 98%  Physical Exam  ED Course / MDM  EKG:    I have reviewed the labs performed to date as well as medications administered while in observation.  Recent changes in the last 24 hours include tts consult. Plan  Current plan is for discharge. Patient is not under full IVC at this time.   Hayden Rasmussen, MD 08/21/19 1719

## 2019-08-21 NOTE — Discharge Instructions (Signed)
Please follow up with [facility] °

## 2019-08-21 NOTE — Care Management (Signed)
ED CM received call concerning consult for assistance with PCP and BH meds, CM contacted [facility] to facilitate assistance and was made aware patient walked out refusing to sign discharge paper work. CM attempted to contact patient by phone no answer no VM set up.

## 2019-08-21 NOTE — ED Notes (Addendum)
Patient refused to sign for discharge papers. Patient left angrily.

## 2019-08-21 NOTE — Consult Note (Addendum)
[facility]  08/21/2019 10:40 AM Paul SalvageJay Blair  MRN:  [id number] Principal Problem: Cocaine abuse with cocaine-induced mood disorder [facility](HCC) Discharge Diagnoses: Principal Problem:   Cocaine abuse with cocaine-induced mood disorder (HCC)   Subjective: Patient seen by nurse practitioner.  Patient alert and oriented at this time, answers appropriately.  Patient endorses chronic suicidality, denies intent.  Patient states "I have 7 children."  Patient denies homicidal ideations.  Patient denies access to weapons.  Patient endorses chronic auditory and visual hallucinations, "I hear voices and see people always."  Patient stable on medications, patient denies command hallucinations. Patient plans [facility].  Patient seen by peers support yesterday, substance use resources given and discussed.  Patient plans to take medications as ordered.  Patient has contacted brother-in-law to pick him up. Patient seen along with Dr. Jannifer FranklinAkintayo who agrees with disposition recommendation of discharge with outpatient resources.  Total Time spent with patient: 45 minutes  Past Psychiatric History: Cocaine abuse with cocaine induced mood disorder, depression, alcohol dependence, alcohol intoxication, major depressive disorder  Past Medical History:  Past Medical History:  Diagnosis Date  . Cocaine abuse (HCC)   . Depression   . History of ETOH abuse   . Homicidal ideations   . Hypertension   . Liver cirrhosis (HCC)   . Substance induced mood disorder (HCC)   . Suicidal ideations    History reviewed. No pertinent surgical history. Family History: No family history on file. Family Psychiatric  History: Denies Social History:  Social History   Substance and Sexual Activity  Alcohol Use Yes   Comment: hx abuse- incarcerated at present     Social History   Substance and Sexual Activity  Drug Use Yes  . Types: Cocaine, Marijuana    Social History   Socioeconomic History   . Marital status: Divorced    Spouse name: Not on file  . Number of children: Not on file  . Years of education: Not on file  . Highest education level: Not on file  Occupational History  . Not on file  Social Needs  . Financial resource strain: Not on file  . Food insecurity    Worry: Not on file    Inability: Not on file  . Transportation needs    Medical: Not on file    Non-medical: Not on file  Tobacco Use  . Smoking status: Current Every Day Smoker  . Smokeless tobacco: Never Used  Substance and Sexual Activity  . Alcohol use: Yes    Comment: hx abuse- incarcerated at present  . Drug use: Yes    Types: Cocaine, Marijuana  . Sexual activity: Not on file  Lifestyle  . Physical activity    Days per week: Not on file    Minutes per session: Not on file  . Stress: Not on file  Relationships  . Social Musicianconnections    Talks on phone: Not on file    Gets together: Not on file    Attends religious service: Not on file    Active member of club or organization: Not on file    Attends meetings of clubs or organizations: Not on file    Relationship status: Not on file  Other Topics Concern  . Not on file  Social History Narrative  . Not on file    Has this patient used any form of tobacco in the last 30 days? (Cigarettes, Smokeless Tobacco, Cigars, and/or Pipes) A prescription for an FDA-approved tobacco cessation  medication was offered at discharge and the patient refused  Current Medications: Current Facility-Administered Medications  Medication Dose Route Frequency Provider Last Rate Last Dose  . acetaminophen (TYLENOL) tablet 500 mg  500 mg Oral Q4H PRN Drenda Freeze, MD      . ibuprofen (ADVIL) tablet 600 mg  600 mg Oral Q6H PRN Drenda Freeze, MD   600 mg at 08/20/19 5462   Current Outpatient Medications  Medication Sig Dispense Refill  . amLODipine (NORVASC) 10 MG tablet Take 1 tablet (10 mg total) by mouth daily. 30 tablet 0  . cholecalciferol (VITAMIN  D3) 25 MCG (1000 UT) tablet Take 1,000 Units by mouth daily.    . DULoxetine 40 MG CPEP Take 40 mg by mouth daily. 30 capsule 0  . fluticasone (FLONASE) 50 MCG/ACT nasal spray Place 1 spray into both nostrils daily as needed for allergies.     . pantoprazole (PROTONIX) 40 MG tablet Take 1 tablet (40 mg total) by mouth daily. 30 tablet 0  . QUEtiapine (SEROQUEL) 50 MG tablet Take 1 tablet (50 mg total) by mouth at bedtime. 30 tablet 0  . terazosin (HYTRIN) 1 MG capsule Take 1 capsule (1 mg total) by mouth at bedtime. 30 capsule 0  . vitamin B-12 (CYANOCOBALAMIN) 1000 MCG tablet Take 1,000 mcg by mouth daily.    . vitamin E 200 UNIT capsule Take 200 Units by mouth daily.    . Zinc Sulfate (ZINC 15 PO) Take 15 mg by mouth daily.     PTA Medications: (Not in a hospital admission)   Musculoskeletal: Strength & Muscle Tone: within normal limits Gait & Station: normal Patient leans: N/A  Psychiatric Specialty Exam: Physical Exam  Nursing note and vitals reviewed. Constitutional: He is oriented to person, place, and time. He appears well-developed.  HENT:  Head: Normocephalic.  Cardiovascular: Normal rate.  Respiratory: Effort normal.  Neurological: He is alert and oriented to person, place, and time.  Psychiatric: He has a normal mood and affect. His behavior is normal. Judgment and thought content normal.    Review of Systems  Constitutional: Negative.   HENT: Negative.   Eyes: Negative.   Respiratory: Negative.   Cardiovascular: Negative.   Gastrointestinal: Negative.   Genitourinary: Negative.   Musculoskeletal: Negative.   Skin: Negative.   Neurological: Negative.   Endo/Heme/Allergies: Negative.   Psychiatric/Behavioral: Positive for substance abuse.    Blood pressure (!) 149/97, pulse 65, temperature 98.4 F (36.9 C), temperature source Oral, resp. rate 18, SpO2 98 %.There is no height or weight on file to calculate BMI.  General Appearance: Casual  Eye Contact:  Good   Speech:  Clear and Coherent and Normal Rate  Volume:  Normal  Mood:  Anxious  Affect:  Appropriate and Congruent  Thought Process:  Coherent, Goal Directed and Descriptions of Associations: Intact  Orientation:  Full (Time, Place, and Person)  Thought Content:  WDL and Logical  Suicidal Thoughts:  Yes.  without intent/plan  Homicidal Thoughts:  No  Memory:  Immediate;   Good Recent;   Good Remote;   Good  Judgement:  Good  Insight:  Good  Psychomotor Activity:  Normal  Concentration:  Concentration: Good and Attention Span: Good  Recall:  Good  Fund of Knowledge:  Good  Language:  Good  Akathisia:  No  Handed:  Right  AIMS (if indicated):     Assets:  Communication Skills Desire for Improvement Financial Resources/Insurance Housing Social Support  ADL's:  Intact  Cognition:  WNL  Sleep:        Demographic Factors:  Male  Loss Factors: NA  Historical Factors: NA  Risk Reduction Factors:   Sense of responsibility to family, Living with another person, especially a relative, Positive social support, Positive therapeutic relationship and Positive coping skills or problem solving skills  Continued Clinical Symptoms:  Alcohol/Substance Abuse/Dependencies  Cognitive Features That Contribute To Risk:  None    Suicide Risk:  Minimal: No identifiable suicidal ideation.  Patients presenting with no risk factors but with morbid ruminations; may be classified as minimal risk based on the severity of the depressive symptoms    Plan Of Care/Follow-up recommendations:  Other:  Follow-up with outpatient resources  Disposition: Discharge home Patrcia Dolly, FNP 08/21/2019, 10:40 AM  Patient seen face-to-face for psychiatric evaluation, chart reviewed and case discussed with the physician extender and developed treatment plan. Reviewed the information documented and agree with the treatment plan. Thedore Mins, MD

## 2019-08-21 NOTE — ED Notes (Signed)
Patient agitated after TTS consult but is still cooperative at this time. Will continue to monitor

## 2019-08-21 NOTE — ED Notes (Signed)
RN reviewed d/c instructions with patient. RN made patient aware of what assistance Paul Blair can provide and the outpatient services as he does not meet inpatient requirements. Patient also requesting assistance with getting a PCP and obtaining medications. RN placed case management consult to assist with patients needs. Patient has called family for a ride.  Patient denies questions at this time. Belongings returned to patient.

## 2019-08-21 NOTE — ED Notes (Signed)
TTS machine at bedside to speak to patient Patient reports plan to OD to kill himself

## 2019-08-21 NOTE — Progress Notes (Signed)
CSW arrived to provide SA/Outpatient [facility]), but was told pt abruptly D/C'd in an angry manner when due to pt's preference for inpatient tx.  Please reconsult if future social work needs arise.  CSW signing off, as social work intervention is no longer needed.  Alphonse Guild. Deshanae Lindo, LCSW, LCAS, CSI Transitions of Care Clinical Social Worker Care Coordination Department Ph: [phone]

## 2019-09-16 DIAGNOSIS — U071 COVID-19: Secondary | ICD-10-CM

## 2019-09-16 HISTORY — DX: COVID-19: U07.1

## 2019-10-18 DIAGNOSIS — U071 COVID-19: Secondary | ICD-10-CM | POA: Insufficient documentation

## 2019-10-18 DIAGNOSIS — Z5900 Homelessness unspecified: Secondary | ICD-10-CM | POA: Insufficient documentation

## 2019-10-18 HISTORY — DX: COVID-19: U07.1

## 2019-11-05 DIAGNOSIS — R0602 Shortness of breath: Secondary | ICD-10-CM

## 2019-11-05 HISTORY — DX: Shortness of breath: R06.02

## 2019-11-16 ENCOUNTER — Emergency Department (HOSPITAL_COMMUNITY)
Admission: EM | Admit: 2019-11-16 | Discharge: 2019-11-16 | Disposition: A | Discharge status: Home / Self Care | Payer: Medicare Other | Attending: Emergency Medicine | Admitting: Emergency Medicine

## 2019-11-16 DIAGNOSIS — Z79899 Other long term (current) drug therapy: Secondary | ICD-10-CM | POA: Insufficient documentation

## 2019-11-16 DIAGNOSIS — F1210 Cannabis abuse, uncomplicated: Secondary | ICD-10-CM | POA: Insufficient documentation

## 2019-11-16 DIAGNOSIS — F329 Major depressive disorder, single episode, unspecified: Secondary | ICD-10-CM | POA: Insufficient documentation

## 2019-11-16 DIAGNOSIS — Z765 Malingerer [conscious simulation]: Secondary | ICD-10-CM

## 2019-11-16 DIAGNOSIS — F1410 Cocaine abuse, uncomplicated: Secondary | ICD-10-CM | POA: Insufficient documentation

## 2019-11-16 DIAGNOSIS — F17200 Nicotine dependence, unspecified, uncomplicated: Secondary | ICD-10-CM | POA: Diagnosis not present

## 2019-11-16 DIAGNOSIS — I10 Essential (primary) hypertension: Secondary | ICD-10-CM | POA: Diagnosis not present

## 2019-11-16 DIAGNOSIS — T50901A Poisoning by unspecified drugs, medicaments and biological substances, accidental (unintentional), initial encounter: Secondary | ICD-10-CM | POA: Diagnosis present

## 2019-11-16 MED ORDER — THIAMINE HCL 100 MG PO TABS
100.00 | ORAL_TABLET | ORAL | Status: DC
Start: 2019-11-17 — End: 2019-11-16

## 2019-11-16 MED ORDER — LORAZEPAM 1 MG PO TABS
2.00 | ORAL_TABLET | ORAL | Status: DC
Start: ? — End: 2019-11-16

## 2019-11-16 MED ORDER — GENERIC EXTERNAL MEDICATION
2.00 | Status: DC
Start: ? — End: 2019-11-16

## 2019-11-16 MED ORDER — FOLIC ACID 1 MG PO TABS
1.00 | ORAL_TABLET | ORAL | Status: DC
Start: 2019-11-17 — End: 2019-11-16

## 2019-11-16 MED ORDER — ONDANSETRON 4 MG PO TBDP
4.00 | ORAL_TABLET | ORAL | Status: DC
Start: ? — End: 2019-11-16

## 2019-11-16 MED ORDER — QUINTABS PO TABS
1.00 | ORAL_TABLET | ORAL | Status: DC
Start: 2019-11-17 — End: 2019-11-16

## 2019-11-16 NOTE — ED Triage Notes (Signed)
[place] [facility] [place] and ingesting 20 long white unknown pills today around 130p that were his friends. Pt reports that his father today.  Vitals: 156/88, 100HR, 97%, CBG 91, temp 98.1.  18g left forearm.

## 2019-11-16 NOTE — ED Provider Notes (Signed)
[facility]-EMERGENCY DEPT Provider Note   CSN: [id number] Arrival date & time: 11/16/19  1418     History Chief Complaint  Patient presents with  . Suicidal  . Ingestion    Paul Blair is a 54 y.o. male.  HPI  54yM with depression. Says his father died today and daughter did this past November. Increasingly depressed about this. Took multiple "pills an associate gave me." He is vague when asked about the specific of this. Says his "associate" got them out of a medicine cabinet and he is unsure of what they were. Took them less than an hour ago. Has no somatic complaints. Denies etoh or street drugs otherwise. No hallucinations. No thoughts of wanting to harm anyone else.   Of note, pt was seen at [facility] earlier today and summary as follows:    Payam Gribble is a 54 y.o. male with a history of schizoaffective disorder bipolar type, bipolar 1 disorder, depression, and polysubstance abuse who presented to the ED for depression and alcohol detox. Patient does not meet criteria for inpatient detox, most recent CIWA scores have been 0 and 0. There is suspicion for malingering for the secondary gain of housing. Patient is presented with residential, MH, and SA resources. He plans to follow up with [facility] or [facility] for residential treatment. He is agreeable with this plan and is discharged in care of self.    Past Medical History:  Diagnosis Date  . Cocaine abuse (HCC)   . Depression   . History of ETOH abuse   . Homicidal ideations   . Hypertension   . Liver cirrhosis (HCC)   . Substance induced mood disorder (HCC)   . Suicidal ideations     Patient Active Problem List   Diagnosis Date Noted  . Cocaine abuse with cocaine-induced mood disorder (HCC) 08/20/2019  . Major depressive disorder, single episode, severe (HCC) 08/06/2019  . Major depressive disorder, recurrent episode, severe (HCC) 08/06/2019  . MDD (major depressive disorder), recurrent, severe, with psychosis  (HCC) 08/05/2019  . Alcohol dependence with uncomplicated withdrawal (HCC) 11/06/2014  . Substance induced mood disorder (HCC) 11/06/2014  . Alcohol intoxication (HCC)   . Depression with suicidal ideation 11/04/2014   No past surgical history on file.    No family history on file.  Social History   Tobacco Use  . Smoking status: Current Every Day Smoker  . Smokeless tobacco: Never Used  Substance Use Topics  . Alcohol use: Yes    Comment: hx abuse- incarcerated at present  . Drug use: Yes    Types: Cocaine, Marijuana    Home Medications Prior to Admission medications   Medication Sig Start Date End Date Taking? Authorizing Provider  amLODipine (NORVASC) 10 MG tablet Take 1 tablet (10 mg total) by mouth daily. 08/13/19 09/12/19  Aldean Baker, NP  cholecalciferol (VITAMIN D3) 25 MCG (1000 UT) tablet Take 1,000 Units by mouth daily.    [provider]  DULoxetine 40 MG CPEP Take 40 mg by mouth daily. 08/14/19   Aldean Baker, NP  fluticasone (FLONASE) 50 MCG/ACT nasal spray Place 1 spray into both nostrils daily as needed for allergies.  10/23/15 07/18/20  [provider]  pantoprazole (PROTONIX) 40 MG tablet Take 1 tablet (40 mg total) by mouth daily. 08/14/19   Aldean Baker, NP  QUEtiapine (SEROQUEL) 50 MG tablet Take 1 tablet (50 mg total) by mouth at bedtime. 08/13/19   Aldean Baker, NP  terazosin (HYTRIN) 1  MG capsule Take 1 capsule (1 mg total) by mouth at bedtime. 08/13/19   Connye Burkitt, NP  vitamin B-12 (CYANOCOBALAMIN) 1000 MCG tablet Take 1,000 mcg by mouth daily.    [provider]  vitamin E 200 UNIT capsule Take 200 Units by mouth daily.    [provider]  Zinc Sulfate (ZINC 15 PO) Take 15 mg by mouth daily.    [provider]    Allergies    Patient has no known allergies.  Review of Systems   Review of Systems All systems reviewed and negative, other than as noted in HPI.  Physical Exam Updated Vital  Signs There were no vitals taken for this visit.  Physical Exam Vitals and nursing note reviewed.  Constitutional:      General: He is not in acute distress.    Appearance: He is well-developed.  HENT:     Head: Normocephalic and atraumatic.  Eyes:     General:        Right eye: No discharge.        Left eye: No discharge.     Conjunctiva/sclera: Conjunctivae normal.  Cardiovascular:     Rate and Rhythm: Normal rate and regular rhythm.     Heart sounds: Normal heart sounds. No murmur. No friction rub. No gallop.   Pulmonary:     Effort: Pulmonary effort is normal. No respiratory distress.     Breath sounds: Normal breath sounds.  Abdominal:     General: There is no distension.     Palpations: Abdomen is soft.     Tenderness: There is no abdominal tenderness.  Musculoskeletal:        General: No tenderness.     Cervical back: Neck supple.  Skin:    General: Skin is warm and dry.  Neurological:     Mental Status: He is alert.  Psychiatric:        Behavior: Behavior normal.        Thought Content: Thought content normal.     ED Results / Procedures / Treatments   Labs (all labs ordered are listed, but only abnormal results are displayed) Labs Reviewed - No data to display  EKG None  Radiology No results found.  Procedures Procedures (including critical care time)  Medications Ordered in ED Medications - No data to display  ED Course  I have reviewed the triage vital signs and the nursing notes.  Pertinent labs & imaging results that were available during my care of the patient were reviewed by me and considered in my medical decision making (see chart for details).    MDM Rules/Calculators/A&P                      54yM presenting after reported ingestion. I don't believe him. Regardless, I doubt an emergent condition. Significant inconsistencies in his stories. History of ingestion is vague. I had him walk me through his day today and it involved him being  in [employer] visiting his pastor because he found out today that his father had died. He was upset about this so an "associate of mine" went through a medicine cabinet and gave him multiple pills because he wanted to kill himself. Then he decided he didn't so he called EMS. He also told me his daughter died this past Aug 27, 2023.   He was actually in the emergency room at [facility] earlier today. He completely omitted this. Note from his visit stating that his daughter died  in 2019. He was assessed including medical and psychiatric assessment. Their impression was malingering for secondary gain of housing. I also suspect he is malingering. He doesn't appear ill. He is hemodynamically stable. He has been previously advised to follow-up with resources.   Final Clinical Impression(s) / ED Diagnoses Final diagnoses:  Malingering    Rx / DC Orders ED Discharge Orders    None       Raeford Razor, MD 11/20/19 1055

## 2020-01-03 ENCOUNTER — Inpatient Hospital Stay (HOSPITAL_COMMUNITY)
Admission: AD | Admit: 2020-01-03 | Discharge: 2020-01-06 | DRG: 885 | Disposition: A | Discharge status: Home / Self Care | Payer: Medicare Other | Attending: Psychiatry | Admitting: Psychiatry

## 2020-01-03 ENCOUNTER — Encounter (HOSPITAL_COMMUNITY): Payer: Self-pay | Admitting: Psychiatric/Mental Health

## 2020-01-03 ENCOUNTER — Other Ambulatory Visit: Payer: Self-pay

## 2020-01-03 DIAGNOSIS — F419 Anxiety disorder, unspecified: Secondary | ICD-10-CM | POA: Diagnosis present

## 2020-01-03 DIAGNOSIS — J309 Allergic rhinitis, unspecified: Secondary | ICD-10-CM | POA: Diagnosis present

## 2020-01-03 DIAGNOSIS — Z20822 Contact with and (suspected) exposure to covid-19: Secondary | ICD-10-CM | POA: Diagnosis present

## 2020-01-03 DIAGNOSIS — K7460 Unspecified cirrhosis of liver: Secondary | ICD-10-CM | POA: Diagnosis present

## 2020-01-03 DIAGNOSIS — K219 Gastro-esophageal reflux disease without esophagitis: Secondary | ICD-10-CM | POA: Diagnosis present

## 2020-01-03 DIAGNOSIS — F17200 Nicotine dependence, unspecified, uncomplicated: Secondary | ICD-10-CM | POA: Diagnosis present

## 2020-01-03 DIAGNOSIS — Z590 Homelessness: Secondary | ICD-10-CM | POA: Diagnosis not present

## 2020-01-03 DIAGNOSIS — I10 Essential (primary) hypertension: Secondary | ICD-10-CM | POA: Diagnosis present

## 2020-01-03 DIAGNOSIS — F1994 Other psychoactive substance use, unspecified with psychoactive substance-induced mood disorder: Secondary | ICD-10-CM | POA: Diagnosis not present

## 2020-01-03 DIAGNOSIS — F1424 Cocaine dependence with cocaine-induced mood disorder: Secondary | ICD-10-CM | POA: Diagnosis not present

## 2020-01-03 DIAGNOSIS — F333 Major depressive disorder, recurrent, severe with psychotic symptoms: Secondary | ICD-10-CM | POA: Diagnosis present

## 2020-01-03 DIAGNOSIS — J449 Chronic obstructive pulmonary disease, unspecified: Secondary | ICD-10-CM | POA: Diagnosis present

## 2020-01-03 DIAGNOSIS — R45851 Suicidal ideations: Secondary | ICD-10-CM | POA: Diagnosis present

## 2020-01-03 DIAGNOSIS — F1020 Alcohol dependence, uncomplicated: Secondary | ICD-10-CM | POA: Diagnosis present

## 2020-01-03 DIAGNOSIS — F1420 Cocaine dependence, uncomplicated: Secondary | ICD-10-CM | POA: Diagnosis present

## 2020-01-03 DIAGNOSIS — N400 Enlarged prostate without lower urinary tract symptoms: Secondary | ICD-10-CM | POA: Diagnosis present

## 2020-01-03 DIAGNOSIS — E559 Vitamin D deficiency, unspecified: Secondary | ICD-10-CM | POA: Diagnosis present

## 2020-01-03 LAB — RAPID URINE DRUG SCREEN, HOSP PERFORMED
Amphetamines: NOT DETECTED
Barbiturates: NOT DETECTED
Benzodiazepines: NOT DETECTED
Cocaine: POSITIVE — AB
Opiates: NOT DETECTED
Tetrahydrocannabinol: NOT DETECTED

## 2020-01-03 LAB — RESPIRATORY PANEL BY RT PCR (FLU A&B, COVID)
Influenza A by PCR: NEGATIVE
Influenza B by PCR: NEGATIVE
SARS Coronavirus 2 by RT PCR: NEGATIVE

## 2020-01-03 LAB — URINALYSIS, ROUTINE W REFLEX MICROSCOPIC
Bilirubin Urine: NEGATIVE
Glucose, UA: NEGATIVE mg/dL
Hgb urine dipstick: NEGATIVE
Ketones, ur: NEGATIVE mg/dL
Nitrite: NEGATIVE
Protein, ur: NEGATIVE mg/dL
Specific Gravity, Urine: 1.014 (ref 1.005–1.030)
pH: 6 (ref 5.0–8.0)

## 2020-01-03 MED ORDER — THIAMINE HCL 100 MG PO TABS
100.0000 mg | ORAL_TABLET | Freq: Every day | ORAL | Status: DC
  Administered 2020-01-04 – 2020-01-06 (×3): 100 mg via ORAL
  Filled 2020-01-03 (×4): qty 1

## 2020-01-03 MED ORDER — QUETIAPINE FUMARATE 50 MG PO TABS
50.0000 mg | ORAL_TABLET | Freq: Every day | ORAL | Status: DC
  Administered 2020-01-03 – 2020-01-05 (×3): 50 mg via ORAL
  Filled 2020-01-03 (×5): qty 1

## 2020-01-03 MED ORDER — AMLODIPINE BESYLATE 10 MG PO TABS
10.0000 mg | ORAL_TABLET | Freq: Every day | ORAL | Status: DC
  Administered 2020-01-03 – 2020-01-06 (×4): 10 mg via ORAL
  Filled 2020-01-03: qty 1
  Filled 2020-01-03: qty 2
  Filled 2020-01-03 (×3): qty 1

## 2020-01-03 MED ORDER — ACETAMINOPHEN 325 MG PO TABS: 650.0000 mg | ORAL_TABLET | Freq: Four times a day (QID) | ORAL | Status: DC | PRN

## 2020-01-03 MED ORDER — LORAZEPAM 1 MG PO TABS: 1.0000 mg | ORAL_TABLET | Freq: Four times a day (QID) | ORAL | Status: DC | PRN

## 2020-01-03 MED ORDER — ALUM & MAG HYDROXIDE-SIMETH 200-200-20 MG/5ML PO SUSP: 30.0000 mL | ORAL | Status: DC | PRN

## 2020-01-03 MED ORDER — HYDROXYZINE HCL 25 MG PO TABS
25.0000 mg | ORAL_TABLET | Freq: Four times a day (QID) | ORAL | Status: AC | PRN
  Administered 2020-01-03 – 2020-01-04 (×2): 25 mg via ORAL
  Filled 2020-01-03 (×2): qty 1

## 2020-01-03 MED ORDER — AMLODIPINE BESYLATE 5 MG PO TABS: 5.0000 mg | ORAL_TABLET | Freq: Every day | ORAL | Status: DC

## 2020-01-03 MED ORDER — MAGNESIUM HYDROXIDE 400 MG/5ML PO SUSP: 30.0000 mL | Freq: Every day | ORAL | Status: DC | PRN

## 2020-01-03 MED ORDER — TRAZODONE HCL 50 MG PO TABS
50.0000 mg | ORAL_TABLET | Freq: Every evening | ORAL | Status: DC | PRN
  Administered 2020-01-03: 22:00:00 50 mg via ORAL
  Filled 2020-01-03: qty 1

## 2020-01-03 MED ORDER — LOPERAMIDE HCL 2 MG PO CAPS: 2.0000 mg | ORAL_CAPSULE | ORAL | Status: AC | PRN

## 2020-01-03 MED ORDER — DULOXETINE HCL 20 MG PO CPEP
20.0000 mg | ORAL_CAPSULE | Freq: Every day | ORAL | Status: DC
  Administered 2020-01-03 – 2020-01-06 (×4): 20 mg via ORAL
  Filled 2020-01-03 (×6): qty 1

## 2020-01-03 MED ORDER — TERAZOSIN HCL 1 MG PO CAPS
1.0000 mg | ORAL_CAPSULE | Freq: Every day | ORAL | Status: DC
  Administered 2020-01-04 – 2020-01-05 (×2): 1 mg via ORAL
  Filled 2020-01-03 (×4): qty 1

## 2020-01-03 MED ORDER — FLUTICASONE PROPIONATE 50 MCG/ACT NA SUSP
1.0000 | Freq: Every day | NASAL | Status: DC
  Administered 2020-01-03 – 2020-01-05 (×3): 1 via NASAL
  Filled 2020-01-03 (×2): qty 16

## 2020-01-03 MED ORDER — PANTOPRAZOLE SODIUM 40 MG PO TBEC
40.0000 mg | DELAYED_RELEASE_TABLET | Freq: Every day | ORAL | Status: DC
  Administered 2020-01-03 – 2020-01-06 (×4): 40 mg via ORAL
  Filled 2020-01-03 (×5): qty 1

## 2020-01-03 MED ORDER — ONDANSETRON 4 MG PO TBDP: 4.0000 mg | ORAL_TABLET | Freq: Four times a day (QID) | ORAL | Status: AC | PRN

## 2020-01-03 MED ORDER — ADULT MULTIVITAMIN W/MINERALS CH
1.0000 | ORAL_TABLET | Freq: Every day | ORAL | Status: DC
  Administered 2020-01-03 – 2020-01-06 (×4): 1 via ORAL
  Filled 2020-01-03 (×5): qty 1

## 2020-01-03 MED ORDER — GABAPENTIN 100 MG PO CAPS
100.0000 mg | ORAL_CAPSULE | Freq: Three times a day (TID) | ORAL | Status: DC
  Administered 2020-01-03 – 2020-01-06 (×9): 100 mg via ORAL
  Filled 2020-01-03 (×13): qty 1

## 2020-01-03 NOTE — Tx Team (Cosign Needed)
Initial Treatment Plan 01/03/2020 4:51 PM [name] BWN:[id number]    PATIENT STRESSORS: Medication change or noncompliance Substance abuse   PATIENT STRENGTHS: Ability for insight Average or above average intelligence Motivation for treatment/growth Physical Health Supportive family/friends   PATIENT IDENTIFIED PROBLEMS: anxiety  depression  Substance abuse  Suicidal ideations               DISCHARGE CRITERIA:  Ability to meet basic life and health needs Improved stabilization in mood, thinking, and/or behavior Medical problems require only outpatient monitoring Motivation to continue treatment in a less acute level of care  PRELIMINARY DISCHARGE PLAN: Attend 12-step recovery group Return to previous living arrangement  PATIENT/FAMILY INVOLVEMENT: This treatment plan has been presented to and reviewed with the patient, Paul Blair.  The patient and family have been given the opportunity to ask questions and make suggestions.  Raylene Miyamoto, RN 01/03/2020, 4:51 PM
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
## 2020-01-03 NOTE — Plan of Care (Signed)
[facility] Observation Crisis Plan  Reason for Crisis Plan:  Chronic Mental Illness/Medical Illness and Substance Abuse   Plan of Care:  Referral for Inpatient Hospitalization and Referral for Substance Abuse  Family Support:   "My sister"   Current Living Environment:  Living Arrangements: Other relatives("I used to stay with my sister till 4 days ago")  Insurance:   Hospital Account    Name Acct ID Class Status Primary Coverage   Alexy, Heldt [id number] [facility] Inpatient Special Open UNITED HEALTHC[employer] MEDICARE - [facility] MEDICARE        Guarantor Account (for Hospital Account [id number])    Name Relation to Pt Service Area Active? Acct Type   Marlana Salvage Self [facility]   Address Phone       [address] [place], [place] [address] [phone])          Coverage Information (for Hospital Account [id number])    1. [facility] MEDICARE/UHC MEDICARE    F/O Payor/Plan Precert #   [facility] MEDICARE/UHC MEDICARE    Subscriber Subscriber #   Serapio, Edelson [id number]   Address Phone   [address] [place], [place] [address] [phone]       2. [place]    F/O Payor/Plan Precert #   [facility] MEDICAID/[place]    Subscriber Subscriber #   Mikolaj, [place] [id number]   Address Phone   [address] [place], [place] [address] [phone]          Legal Guardian:  Legal Guardian: Other:(self)  Primary Care Provider:  [facility] Ginette Otto) Current Outpatient Providers:  [facility]  Psychiatrist:  Name of Psychiatrist: None  Counselor/Therapist:  Name of Therapist: None  Compliant with Medications:  Yes  Additional Information: "I relapsed on cocaine & alcohol after being cleaned for 50 days. I have been on and off drugs for about 20 years now".   Sherryl Manges 4/20/20215:57 PM

## 2020-01-03 NOTE — Progress Notes (Signed)
Psychoeducational Group Note  Date:  01/03/2020 Time:  2113  Group Topic/Focus:  Wrap-Up Group:   The focus of this group is to help patients review their daily goal of treatment and discuss progress on daily workbooks.  Participation Level: Did Not Attend  Participation Quality:  Not Applicable  Affect:  Not Applicable  Cognitive:  Not Applicable  Insight:  Not Applicable  Engagement in Group: Not Applicable  Additional Comments:  The patient did not attend group this evening.   Hazle Coca S 01/03/2020, 9:13 PM
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
## 2020-01-03 NOTE — H&P (Signed)
Behavioral Health Medical Screening Exam  Paul Blair is an 54 y.o. male with history of depression, cocaine use disorder, and alcohol use disorder. He is tearful on assessment. He reports increased depression and was experiencing CAH this morning to kill himself by walking into traffic. He walked into the road, but a man pulled over and helped him call his counselor, who referred him to [facility]. He reports stopping psychotropic medications three months ago, and he relapsed on alcohol and cocaine two days ago. His sister says he must restart psychotropic medications to continue living in the home due to his recent mood instability.    Total Time spent with patient: 15 minutes  Psychiatric Specialty Exam: Physical Exam  Nursing note and vitals reviewed. Constitutional: He is oriented to person, place, and time. He appears well-developed and well-nourished.  Cardiovascular: Normal rate.  Respiratory: Effort normal.  Neurological: He is alert and oriented to person, place, and time.    Review of Systems  Constitutional: Negative.   Respiratory: Negative for cough and shortness of breath.   Cardiovascular: Negative for chest pain.  Gastrointestinal: Negative for nausea and vomiting.  Neurological: Positive for headaches. Negative for tremors.  Psychiatric/Behavioral: Positive for dysphoric mood, hallucinations, sleep disturbance and suicidal ideas. Negative for agitation, behavioral problems, confusion and self-injury. The patient is nervous/anxious. The patient is not hyperactive.     Blood pressure 127/89, pulse (!) 113, temperature (!) 97.4 F (36.3 C), temperature source Oral, resp. rate 17, SpO2 97 %.There is no height or weight on file to calculate BMI.  General Appearance: Casual  Eye Contact:  Good  Speech:  Normal Rate  Volume:  Normal  Mood:  Anxious and Depressed  Affect:  Congruent and Tearful  Thought Process:  Coherent  Orientation:  Full (Time, Place, and Person)  Thought  Content:  Hallucinations: Auditory Command:  to kill self  Suicidal Thoughts:  Yes.  with intent/plan  Homicidal Thoughts:  No  Memory:  Immediate;   Good Recent;   Good Remote;   Good  Judgement:  Impaired  Insight:  Fair  Psychomotor Activity:  Normal  Concentration: Concentration: Fair and Attention Span: Fair  Recall:  Good  Fund of Knowledge:Fair  Language: Good  Akathisia:  No  Handed:  Right  AIMS (if indicated):     Assets:  Communication Skills Desire for Improvement Financial Resources/Insurance Housing Social Support  Sleep:       Musculoskeletal: Strength & Muscle Tone: within normal limits Gait & Station: normal Patient leans: N/A  Blood pressure 127/89, pulse (!) 113, temperature (!) 97.4 F (36.3 C), temperature source Oral, resp. rate 17, SpO2 97 %.  Recommendations:  Based on my evaluation the patient does not appear to have an emergency medical condition.  Inpatient hospitalization.  Aldean Baker, NP 01/03/2020, 11:22 AM

## 2020-01-04 DIAGNOSIS — F333 Major depressive disorder, recurrent, severe with psychotic symptoms: Principal | ICD-10-CM

## 2020-01-04 LAB — CBC
HCT: 47.1 % (ref 39.0–52.0)
Hemoglobin: 16.2 g/dL (ref 13.0–17.0)
MCH: 31.3 pg (ref 26.0–34.0)
MCHC: 34.4 g/dL (ref 30.0–36.0)
MCV: 90.9 fL (ref 80.0–100.0)
Platelets: 239 10*3/uL (ref 150–400)
RBC: 5.18 MIL/uL (ref 4.22–5.81)
RDW: 14.4 % (ref 11.5–15.5)
WBC: 5.5 10*3/uL (ref 4.0–10.5)
nRBC: 0 % (ref 0.0–0.2)

## 2020-01-04 LAB — COMPREHENSIVE METABOLIC PANEL
ALT: 23 U/L (ref 0–44)
AST: 20 U/L (ref 15–41)
Albumin: 3.8 g/dL (ref 3.5–5.0)
Alkaline Phosphatase: 56 U/L (ref 38–126)
Anion gap: 8 (ref 5–15)
BUN: 12 mg/dL (ref 6–20)
CO2: 28 mmol/L (ref 22–32)
Calcium: 8.7 mg/dL — ABNORMAL LOW (ref 8.9–10.3)
Chloride: 108 mmol/L (ref 98–111)
Creatinine, Ser: 1.04 mg/dL (ref 0.61–1.24)
GFR calc Af Amer: >60 mL/min (ref >60)
GFR calc non Af Amer: >60 mL/min (ref >60)
Glucose, Bld: 95 mg/dL (ref 70–99)
Potassium: 3.2 mmol/L — ABNORMAL LOW (ref 3.5–5.1)
Sodium: 144 mmol/L (ref 135–145)
Total Bilirubin: 0.7 mg/dL (ref 0.3–1.2)
Total Protein: 6.8 g/dL (ref 6.5–8.1)

## 2020-01-04 LAB — LIPID PANEL
Cholesterol: 162 mg/dL (ref 0–200)
HDL: 38 mg/dL — ABNORMAL LOW (ref >40)
LDL Cholesterol: 106 mg/dL — ABNORMAL HIGH (ref 0–99)
Total CHOL/HDL Ratio: 4.3 RATIO
Triglycerides: 89 mg/dL (ref <150)
VLDL: 18 mg/dL (ref 0–40)

## 2020-01-04 LAB — TSH: TSH: 3.077 u[IU]/mL (ref 0.350–4.500)

## 2020-01-04 LAB — ETHANOL: Alcohol, Ethyl (B): <10 mg/dL (ref <10)

## 2020-01-04 LAB — HEMOGLOBIN A1C
Hgb A1c MFr Bld: 5.4 % (ref 4.8–5.6)
Mean Plasma Glucose: 108.28 mg/dL

## 2020-01-04 MED ORDER — TAMSULOSIN HCL 0.4 MG PO CAPS
0.4000 mg | ORAL_CAPSULE | Freq: Every day | ORAL | Status: DC
  Administered 2020-01-04 – 2020-01-05 (×2): 0.4 mg via ORAL
  Filled 2020-01-04 (×3): qty 1

## 2020-01-04 MED ORDER — FOLIC ACID 1 MG PO TABS
1.0000 mg | ORAL_TABLET | Freq: Every day | ORAL | Status: DC
  Administered 2020-01-04 – 2020-01-06 (×3): 1 mg via ORAL
  Filled 2020-01-04 (×4): qty 1

## 2020-01-04 MED ORDER — POTASSIUM CHLORIDE CRYS ER 20 MEQ PO TBCR
20.0000 meq | EXTENDED_RELEASE_TABLET | Freq: Once | ORAL | Status: AC
  Administered 2020-01-04: 11:00:00 20 meq via ORAL
  Filled 2020-01-04: qty 1

## 2020-01-04 NOTE — [facility] Suicide Risk Assessment (Signed)
[facility] Admission Suicide Risk Assessment   Nursing information obtained from:  Patient Demographic factors:  Male, Unemployed Current Mental Status:  Self-harm thoughts, Suicidal ideation indicated by patient Loss Factors:  NA Historical Factors:  Prior suicide attempts(pt states he OD in past) Risk Reduction Factors:  Religious beliefs about death  Total Time spent with patient: 20 minutes Principal Problem: <principal problem not specified> Diagnosis:  Active Problems:   MDD (major depressive disorder), recurrent, severe, with psychosis (HCC)  Subjective Data: Patient is seen and examined.  Patient is a 54 year old male with a reported past psychiatric history significant for bipolar disorder versus schizoaffective disorder and cocaine dependence.  The patient presented as a walk-in patient to the behavioral health hospital on 01/03/2020 wanting to get back on his medications.  He informed those in the evaluation he was suicidal.  The patient stated initially that he had been off his medications for approximately 3 months.  Unfortunately this was not true.  The patient had been hospitalized at [facility] on approximately March 3.  He was seen there and had reported 12 shots of alcohol, 640 ounce beers and a bottle of wine a day as well as cocaine.  Today he states he has had 50 days of sobriety, but I am unable to quantitate that myself.  It was felt at that time at [facility] that this was a degree of malingering.  He had stated at that time he was going to follow-up with [facility] or [facility] for residential treatment.  Today he states that he has been at [facility] for approximately 3 days, and he hopes to return there.  He also stated he had been staying with his sister who was unable to cope with his drug use, and apparently asked him to leave.  He was admitted to the hospital for evaluation and stabilization.  Continued Clinical Symptoms:  Alcohol Use Disorder Identification Test Final Score  (AUDIT): 25 The "Alcohol Use Disorders Identification Test", Guidelines for Use in Primary Care, Second Edition.  World Science writer [facility]). Score between 0-7:  no or low risk or alcohol related problems. Score between 8-15:  moderate risk of alcohol related problems. Score between 16-19:  high risk of alcohol related problems. Score 20 or above:  warrants further diagnostic evaluation for alcohol dependence and treatment.   CLINICAL FACTORS:   Bipolar Disorder:   Mixed State Alcohol/Substance Abuse/Dependencies   Musculoskeletal: Strength & Muscle Tone: within normal limits Gait & Station: normal Patient leans: N/A  Psychiatric Specialty Exam: Physical Exam  Nursing note and vitals reviewed. Constitutional: He is oriented to person, place, and time. He appears well-developed and well-nourished.  HENT:  Head: Normocephalic and atraumatic.  Respiratory: Effort normal.  Neurological: He is alert and oriented to person, place, and time.    Review of Systems  Blood pressure 98/75, pulse (!) 101, temperature (!) 97.4 F (36.3 C), temperature source Oral, resp. rate 16, height 6\' 1"  (1.854 m), weight 113.4 kg, SpO2 97 %.Body mass index is 32.98 kg/m.  General Appearance: Casual  Eye Contact:  Fair  Speech:  Normal Rate  Volume:  Normal  Mood:  Euthymic  Affect:  Congruent  Thought Process:  Coherent and Descriptions of Associations: Circumstantial  Orientation:  Full (Time, Place, and Person)  Thought Content:  Hallucinations: Auditory  Suicidal Thoughts:  No  Homicidal Thoughts:  No  Memory:  Immediate;   Fair Recent;   Fair Remote;   Fair  Judgement:  Impaired  Insight:  Lacking  Psychomotor Activity:  Normal  Concentration:  Concentration: Good and Attention Span: Good  Recall:  Juda of Knowledge:  Good  Language:  Good  Akathisia:  Negative  Handed:  Right  AIMS (if indicated):     Assets:  Desire for Improvement Resilience  ADL's:  Intact   Cognition:  WNL  Sleep:  Number of Hours: 5.5      COGNITIVE FEATURES THAT CONTRIBUTE TO RISK:  None    SUICIDE RISK:   Minimal: No identifiable suicidal ideation.  Patients presenting with no risk factors but with morbid ruminations; may be classified as minimal risk based on the severity of the depressive symptoms  PLAN OF CARE: Patient is seen and examined.  Patient is a 54 year old male with the above-stated past psychiatric history who was admitted on 01/03/2020 with suicidal ideation.  He has been restarted on on his medications that he had on his last hospitalization.  He also has received Flomax for some prostate and urinary problems which have developed since he was last hospitalized here.  We will also continue his amlodipine.  Review of his admission laboratories revealed a mildly low potassium at 3.2, and that will be supplemented.  His lipid panel was essentially normal.  The rest of his electrolyte panel were all normal.  His CBC is normal.  TSH was normal.  Urinalysis showed a trace leukocyte esterase, rare bacteria, and 0-5 white blood cells.  Blood alcohol on admission was less than 10.  Drug screen was positive for cocaine.  His vital signs are stable, he is afebrile.  His CIWA this a.m. was 0.  He slept 5.5 hours last night.  I certify that inpatient services furnished can reasonably be expected to improve the patient's condition.   Sharma Covert, MD 01/04/2020, 9:09 AM

## 2020-01-04 NOTE — Progress Notes (Signed)
   01/04/20 2152  Psych Admission Type (Psych Patients Only)  Admission Status Voluntary  Psychosocial Assessment  Patient Complaints None  Eye Contact Fair  Facial Expression Animated  Affect Anxious  Speech Logical/coherent  Interaction Assertive  Motor Activity Other (Comment) (WNL)  Appearance/Hygiene Unremarkable  Behavior Characteristics Cooperative  Mood Pleasant  Thought Process  Coherency WDL  Content WDL  Delusions None reported or observed  Perception Hallucinations  Hallucination Auditory (voices quieter today per pt)  Judgment Poor  Confusion None  Danger to Self  Current suicidal ideation? Denies  Danger to Others  Danger to Others None reported or observed   Pt denies SI today but endorses AH stating that the voices are quieter today.

## 2020-01-04 NOTE — Progress Notes (Signed)
Recreation Therapy Notes  Date:  4.21.21 Time: 0930 Location: 300 Hall Group Room  Group Topic: Stress Management  Goal Area(s) Addresses:  Patient will identify positive stress management techniques. Patient will identify benefits of using stress management post d/c.  Behavioral Response: Engaged  Intervention: Stress Management  Activity :  Guided Imagery.  LRT read Blair script that took patients on [place] walk [place].  Patients were to listen and follow along as script was read to engage in activity.  Education:  Stress Management, Discharge Planning.   Education Outcome: Acknowledges Education  Clinical Observations/Feedback: Pt attended and participated in activity.    Paul Blair, LRT/CTRS         Paul Blair 01/04/2020 11:14 AM 

## 2020-01-04 NOTE — Progress Notes (Signed)
   01/03/20 2100  Psych Admission Type (Psych Patients Only)  Admission Status Voluntary  Psychosocial Assessment  Patient Complaints Self-harm thoughts  Eye Contact Fair  Facial Expression Anxious;Sad  Affect Depressed;Anxious  Speech Logical/coherent  Interaction Assertive  Motor Activity Other (Comment) (WNL)  Appearance/Hygiene Unremarkable  Behavior Characteristics Cooperative;Anxious  Mood Depressed;Anxious  Thought Process  Coherency WDL  Content WDL  Delusions None reported or observed  Perception Hallucinations  Hallucination Auditory;Visual  Judgment Poor  Confusion None  Danger to Self  Current suicidal ideation? Passive  Self-Injurious Behavior No self-injurious ideation or behavior indicators observed or expressed   Agreement Not to Harm Self Yes  Description of Agreement verbal agreement  Danger to Others  Danger to Others None reported or observed   Pt seen at nurse's station. Pt c/o pain 6/10 in lower back. Pt endorses passive SI without plan and [facility] saying "the voices tell me to do things." Pt agrees to come to staff before he follows command voices and also if he feels worsening SI. Pt rates his anxiety 6/10.

## 2020-01-04 NOTE — Progress Notes (Addendum)
D:  Patient's self inventory sheet, patient sleeps good, no sleep medication.  Fair appetite, low energy level, poor concentration.  Rated depression 7, hopeless 8, anxiety 10.  Denied withdrawals.  SI, contracts for safety, no plan.  Denied physical problems,  Physical pain, back, worst pain in past 24 hours is #4.  Goal is decrease anxiety.  Plans to talk to MD.  Work on depression.  Denied discharge plans. A:  Medications administered per MD orders.  Emotional support and encouragement given patient. R:  Continues to feel SI and HI, contracts for safety.  A/V hallucinations continue.  Safety maintained with 15 minute checks.

## 2020-01-04 NOTE — Tx Team (Signed)
Interdisciplinary Treatment and Diagnostic Plan Update  01/04/2020 Time of Session: 9:40am Paul Blair MRN: [id number]  Principal Diagnosis: <principal problem not specified>  Secondary Diagnoses: Active Problems:   MDD (major depressive disorder), recurrent, severe, with psychosis ([place])   Current Medications:  Current Facility-Administered Medications  Medication Dose Route Frequency Provider Last Rate Last Admin  . acetaminophen (TYLENOL) tablet 650 mg  650 mg Oral Q6H PRN Connye Burkitt, NP      . alum & mag hydroxide-simeth (MAALOX/MYLANTA) 200-200-20 MG/5ML suspension 30 mL  30 mL Oral Q4H PRN Connye Burkitt, NP      . amLODipine (NORVASC) tablet 10 mg  10 mg Oral Daily Connye Burkitt, NP   10 mg at 01/04/20 0847  . DULoxetine (CYMBALTA) DR capsule 20 mg  20 mg Oral Daily Connye Burkitt, NP   20 mg at 01/04/20 0849  . fluticasone (FLONASE) 50 MCG/ACT nasal spray 1 spray  1 spray Each Nare Daily Connye Burkitt, NP   1 spray at 01/04/20 0850  . folic acid (FOLVITE) tablet 1 mg  1 mg Oral Daily Sharma Covert, MD   1 mg at 01/04/20 0847  . gabapentin (NEURONTIN) capsule 100 mg  100 mg Oral TID Connye Burkitt, NP   100 mg at 01/04/20 0846  . hydrOXYzine (ATARAX/VISTARIL) tablet 25 mg  25 mg Oral Q6H PRN Connye Burkitt, NP   25 mg at 01/03/20 2152  . loperamide (IMODIUM) capsule 2-4 mg  2-4 mg Oral PRN Connye Burkitt, NP      . LORazepam (ATIVAN) tablet 1 mg  1 mg Oral Q6H PRN Connye Burkitt, NP      . magnesium hydroxide (MILK OF MAGNESIA) suspension 30 mL  30 mL Oral Daily PRN Connye Burkitt, NP      . multivitamin with minerals tablet 1 tablet  1 tablet Oral Daily Connye Burkitt, NP   1 tablet at 01/04/20 0847  . ondansetron (ZOFRAN-ODT) disintegrating tablet 4 mg  4 mg Oral Q6H PRN Connye Burkitt, NP      . pantoprazole (PROTONIX) EC tablet 40 mg  40 mg Oral Daily Connye Burkitt, NP   40 mg at 01/04/20 0847  . QUEtiapine (SEROQUEL) tablet 50 mg  50 mg Oral QHS Connye Burkitt, NP    50 mg at 01/03/20 2150  . tamsulosin (FLOMAX) capsule 0.4 mg  0.4 mg Oral QPC supper Sharma Covert, MD      . terazosin (HYTRIN) capsule 1 mg  1 mg Oral QHS Connye Burkitt, NP      . thiamine tablet 100 mg  100 mg Oral Daily Connye Burkitt, NP   100 mg at 01/04/20 0847  . traZODone (DESYREL) tablet 50 mg  50 mg Oral QHS PRN Connye Burkitt, NP   50 mg at 01/03/20 2150   PTA Medications: Medications Prior to Admission  Medication Sig Dispense Refill Last Dose  . albuterol (VENTOLIN HFA) 108 (90 Base) MCG/ACT inhaler Inhale 1-2 puffs into the lungs every 4 (four) hours as needed for shortness of breath.     Marland Kitchen amLODipine (NORVASC) 10 MG tablet Take 1 tablet (10 mg total) by mouth daily. 30 tablet 0   . cholecalciferol (VITAMIN D3) 25 MCG (1000 UT) tablet Take 1,000 Units by mouth daily.     . fluticasone (FLONASE) 50 MCG/ACT nasal spray Place 1 spray into both nostrils daily as needed for allergies.      Marland Kitchen  losartan (COZAAR) 50 MG tablet Take 50 mg by mouth daily.     . tamsulosin (FLOMAX) 0.4 MG CAPS capsule Take 0.4 mg by mouth daily.     . vitamin B-12 (CYANOCOBALAMIN) 1000 MCG tablet Take 1,000 mcg by mouth daily.     . vitamin E 200 UNIT capsule Take 200 Units by mouth daily.     . Zinc Sulfate (ZINC 15 PO) Take 15 mg by mouth daily.     . DULoxetine 40 MG CPEP Take 40 mg by mouth daily. (Patient not taking: Reported on 01/03/2020) 30 capsule 0 Not Taking at Unknown time  . pantoprazole (PROTONIX) 40 MG tablet Take 1 tablet (40 mg total) by mouth daily. (Patient not taking: Reported on 01/03/2020) 30 tablet 0 Not Taking at Unknown time  . QUEtiapine (SEROQUEL) 50 MG tablet Take 1 tablet (50 mg total) by mouth at bedtime. (Patient not taking: Reported on 01/03/2020) 30 tablet 0 Not Taking at Unknown time  . terazosin (HYTRIN) 1 MG capsule Take 1 capsule (1 mg total) by mouth at bedtime. (Patient not taking: Reported on 01/03/2020) 30 capsule 0 Not Taking at Unknown time    Patient Stressors:  Medication change or noncompliance Substance abuse  Patient Strengths: Ability for insight Average or above average intelligence Motivation for treatment/growth Physical Health Supportive family/friends  Treatment Modalities: Medication Management, Group therapy, Case management,  1 to 1 session with clinician, Psychoeducation, Recreational therapy.   Physician Treatment Plan for Primary Diagnosis: <principal problem not specified> Long Term Goal(s):     Short Term Goals:    Medication Management: Evaluate patient's response, side effects, and tolerance of medication regimen.  Therapeutic Interventions: 1 to 1 sessions, Unit Group sessions and Medication administration.  Evaluation of Outcomes: Not Met  Physician Treatment Plan for Secondary Diagnosis: Active Problems:   MDD (major depressive disorder), recurrent, severe, with psychosis (HCC)  Long Term Goal(s):     Short Term Goals:       Medication Management: Evaluate patient's response, side effects, and tolerance of medication regimen.  Therapeutic Interventions: 1 to 1 sessions, Unit Group sessions and Medication administration.  Evaluation of Outcomes: Not Met   RN Treatment Plan for Primary Diagnosis: <principal problem not specified> Long Term Goal(s): Knowledge of disease and therapeutic regimen to maintain health will improve  Short Term Goals: Ability to participate in decision making will improve, Ability to verbalize feelings will improve, Ability to disclose and discuss suicidal ideas, Ability to identify and develop effective coping behaviors will improve and Compliance with prescribed medications will improve  Medication Management: RN will administer medications as ordered by provider, will assess and evaluate patient's response and provide education to patient for prescribed medication. RN will report any adverse and/or side effects to prescribing provider.  Therapeutic Interventions: 1 on 1 counseling  sessions, Psychoeducation, Medication administration, Evaluate responses to treatment, Monitor vital signs and CBGs as ordered, Perform/monitor CIWA, COWS, AIMS and Fall Risk screenings as ordered, Perform wound care treatments as ordered.  Evaluation of Outcomes: Not Met   LCSW Treatment Plan for Primary Diagnosis: <principal problem not specified> Long Term Goal(s): Safe transition to appropriate next level of care at discharge, Engage patient in therapeutic group addressing interpersonal concerns.  Short Term Goals: Engage patient in aftercare planning with referrals and resources  Therapeutic Interventions: Assess for all discharge needs, 1 to 1 time with Social worker, Explore available resources and support systems, Assess for adequacy in community support network, Educate family and significant other(s) on   suicide prevention, Complete Psychosocial Assessment, Interpersonal group therapy.  Evaluation of Outcomes: Not Met   Progress in Treatment: Attending groups: No. Participating in groups: No. Taking medication as prescribed: Yes. Toleration medication: Yes. Family/Significant other contact made: No, will contact:  if patient consents to collateral contacts Patient understands diagnosis: Yes. Discussing patient identified problems/goals with staff: Yes. Medical problems stabilized or resolved: Yes. Denies suicidal/homicidal ideation: Yes. Issues/concerns per patient self-inventory: No. Other:   New problem(s) identified: None   New Short Term/Long Term Goal(s): medication stabilization, elimination of SI thoughts, development of comprehensive mental wellness plan.    Patient Goals:  "to get on my meds"   Discharge Plan or Barriers: Patient recently admitted. CSW will continue to follow and assess for appropriate referrals and possible discharge planning.    Reason for Continuation of Hospitalization: Anxiety Depression Medication stabilization Suicidal  ideation  Estimated Length of Stay: 3-5 days   Attendees: Patient: Paul Blair  01/04/2020 11:01 AM  Physician: Dr. Greg Clary, MD 01/04/2020 11:01 AM  Nursing:  01/04/2020 11:01 AM  RN Care Manager: 01/04/2020 11:01 AM  Social Worker:  , LCSW 01/04/2020 11:01 AM  Recreational Therapist:  01/04/2020 11:01 AM  Other:  01/04/2020 11:01 AM  Other:  01/04/2020 11:01 AM  Other: 01/04/2020 11:01 AM    Scribe for Treatment Team:  E , LCSWA 01/04/2020 11:01 AM 

## 2020-01-04 NOTE — Plan of Care (Signed)
Nurse discussed anxiety, depression and coping skills with patient.  

## 2020-01-04 NOTE — [facility] Group Notes (Signed)
LCSW Group Therapy Note  Type of Therapy/Topic: Group Therapy: Six Dimensions of Wellness  Participation Level: Minimal  Description of Group: This group will address the concept of wellness and the six concepts of wellness: occupational, physical, social, intellectual, spiritual, and emotional. Patients will be encouraged to process areas in their lives that are out of balance and identify reasons for remaining unbalanced. Patients will be encouraged to explore ways to practice healthy habits on a daily basis to attain better physical and mental health outcomes.  Therapeutic Goals: 1. Identify aspects of wellness that they are doing well. 2. Identify aspects of wellness that they would like to improve upon. 3. Identify one action they can take to improve an aspect of wellness in their lives.    Summary of Patient Progress: Paul Blair remained in group throughout the duration, but fell asleep several times. Paul Blair stated he feels he is satisfied with his intellectual and spiritual wellness, but he would like to work on his emotional wellness.  Therapeutic Modalities: Cognitive Behavioral Therapy Solution-Focused Therapy Relapse Prevention

## 2020-01-04 NOTE — Progress Notes (Signed)
[facility] Group Notes:  (Nursing/MHT/Case Management/Adjunct)  Date:  01/04/2020  Time:  2030  Type of Therapy:  wrap up group  Participation Level:  Minimal  Participation Quality:  Attentive and Supportive  Affect:  Blunted  Cognitive:  Alert  Insight:  Improving  Engagement in Group:  Supportive  Modes of Intervention:  Clarification, Education and Support  Summary of Progress/Problems: Positive thinking and positive change were discussed.   Marcille Buffy 01/04/2020, 9:23 PM

## 2020-01-05 DIAGNOSIS — F1020 Alcohol dependence, uncomplicated: Secondary | ICD-10-CM

## 2020-01-05 DIAGNOSIS — F1424 Cocaine dependence with cocaine-induced mood disorder: Secondary | ICD-10-CM

## 2020-01-05 DIAGNOSIS — F1994 Other psychoactive substance use, unspecified with psychoactive substance-induced mood disorder: Secondary | ICD-10-CM

## 2020-01-05 DIAGNOSIS — I10 Essential (primary) hypertension: Secondary | ICD-10-CM

## 2020-01-05 MED ORDER — FLUTICASONE PROPIONATE 50 MCG/ACT NA SUSP
2.0000 | Freq: Every day | NASAL | Status: DC
  Administered 2020-01-06: 2 via NASAL
  Filled 2020-01-05: qty 16

## 2020-01-05 NOTE — Progress Notes (Signed)
[facility]  01/05/2020 11:12 AM Paul SalvageJay Currington  MRN:  [id number] Subjective: Patient is a 54 year old male with a reported past psychiatric history significant for bipolar disorder versus schizoaffective disorder as well as cocaine dependence.  He was admitted on 01/03/2020 to "get back on my medicines".  Objective: Patient is seen and examined.  Patient is a 54 year old male with the above-stated past psychiatric history who is seen in follow-up.  He stated he "feels better".  He denied any suicidal or homicidal ideation.  He denied any auditory or visual hallucinations.  He has denied suicidality and homicidality once he was admitted.  He stated his primary objective is to get his drug issues taken care of and to get back on his medicines.  He had an odd complaint of his face feeling strange.  It was not in a neurological pattern.  It was on his anterior face, and not located in any particular nerve distribution.  He denied any other problems.  [facility]  [facility] today [facility], and he apparently now has an act team who is interested in following him.  His vital signs are stable, he is afebrile.  His CIWA this morning was 0.  He slept 6 hours last night.  Review of his laboratories showed a mildly low potassium at 3.2.  Otherwise negative.  Lipid panel was essentially normal.  His CBC was normal.  Liver function enzymes were normal.  Urinalysis was negative.  Blood alcohol was less than 10.  Drug screen was positive for cocaine.  Principal Problem: <principal problem not specified> Diagnosis: Active Problems:   MDD (major depressive disorder), recurrent, severe, with psychosis (HCC)  Total Time spent with patient: 20 minutes  Past Psychiatric History: See admission H&P  Past Medical History:  Past Medical History:  Diagnosis Date  . Cocaine abuse (HCC)   . Depression   . History of  ETOH abuse   . Homicidal ideations   . Hypertension   . Liver cirrhosis (HCC)   . Substance induced mood disorder (HCC)   . Suicidal ideations    History reviewed. No pertinent surgical history. Family History: History reviewed. No pertinent family history. Family Psychiatric  History: See admission H&P Social History:  Social History   Substance and Sexual Activity  Alcohol Use Yes   Comment: hx abuse- incarcerated at present     Social History   Substance and Sexual Activity  Drug Use Yes  . Types: Cocaine, Marijuana    Social History   Socioeconomic History  . Marital status: Divorced    Spouse name: Not on file  . Number of children: Not on file  . Years of education: Not on file  . Highest education level: Not on file  Occupational History  . Not on file  Tobacco Use  . Smoking status: Current Every Day Smoker  . Smokeless tobacco: Never Used  Substance and Sexual Activity  . Alcohol use: Yes    Comment: hx abuse- incarcerated at present  . Drug use: Yes    Types: Cocaine, Marijuana  . Sexual activity: Not on file  Other Topics Concern  . Not on file  Social History Narrative  . Not on file   Social Determinants of Health   Financial Resource Strain:   . Difficulty of Paying Living Expenses:   Food Insecurity:   . Worried About Programme researcher, broadcasting/film/videounning Out of Food in  the Last Year:   . [place] in the Last Year:   Transportation Needs:   . Film/video editor (Medical):   Marland Kitchen Lack of Transportation (Non-Medical):   Physical Activity:   . Days of Exercise per Week:   . Minutes of Exercise per Session:   Stress:   . Feeling of Stress :   Social Connections:   . Frequency of Communication with Friends and Family:   . Frequency of Social Gatherings with Friends and Family:   . Attends Religious Services:   . Active Member of Clubs or Organizations:   . Attends Archivist Meetings:   Marland Kitchen Marital Status:    Additional Social History:    Pain  Medications: Please see MAR Prescriptions: Please see MAR Over the Counter: Please see MAR History of alcohol / drug use?: Yes Longest period of sobriety (when/how long): 50 days Name of Substance 1: Cocaine 1 - [age] of First Use[age] 1 - Amount (size/oz): 3 grams 1 - Frequency: Unknown 1 - Duration: Unknown 1 - Last Use / Amount: 2 days ago Name of Substance 2: EtOH 2 - [age] of First Use[age] 2 - Amount (size/oz): 5 "bootleggers" (6-8 ounces of wine) and 6 12-ounce cans of beer 2 - Frequency: Unknown 2 - Duration: Unknown 2 - Last Use / Amount: 2 days ago Name of Substance 3: Marijuana 3 - Age of First Use: 10 3 - Amount (size/oz): 1 joint 3 - Frequency: Unknown 3 - Duration: Unknown 3 - Last Use / Amount: 2 days ago              Sleep: Good  Appetite:  Good  Current Medications: Current Facility-Administered Medications  Medication Dose Route Frequency Provider Last Rate Last Admin  . acetaminophen (TYLENOL) tablet 650 mg  650 mg Oral Q6H PRN Connye Burkitt, NP      . alum & mag hydroxide-simeth (MAALOX/MYLANTA) 200-200-20 MG/5ML suspension 30 mL  30 mL Oral Q4H PRN Connye Burkitt, NP      . amLODipine (NORVASC) tablet 10 mg  10 mg Oral Daily Connye Burkitt, NP   10 mg at 01/05/20 0843  . DULoxetine (CYMBALTA) DR capsule 20 mg  20 mg Oral Daily Connye Burkitt, NP   20 mg at 01/05/20 0843  . fluticasone (FLONASE) 50 MCG/ACT nasal spray 1 spray  1 spray Each Nare Daily Connye Burkitt, NP   1 spray at 01/05/20 0845  . folic acid (FOLVITE) tablet 1 mg  1 mg Oral Daily Sharma Covert, MD   1 mg at 01/05/20 0843  . gabapentin (NEURONTIN) capsule 100 mg  100 mg Oral TID Connye Burkitt, NP   100 mg at 01/05/20 0843  . hydrOXYzine (ATARAX/VISTARIL) tablet 25 mg  25 mg Oral Q6H PRN Connye Burkitt, NP   25 mg at 01/04/20 1208  . loperamide (IMODIUM) capsule 2-4 mg  2-4 mg Oral PRN Connye Burkitt, NP      . LORazepam (ATIVAN) tablet 1 mg  1 mg Oral Q6H PRN Connye Burkitt, NP       . magnesium hydroxide (MILK OF MAGNESIA) suspension 30 mL  30 mL Oral Daily PRN Connye Burkitt, NP      . multivitamin with minerals tablet 1 tablet  1 tablet Oral Daily Connye Burkitt, NP   1 tablet at 01/05/20 0843  . ondansetron (ZOFRAN-ODT) disintegrating tablet 4 mg  4 mg Oral Q6H  PRN Aldean Baker, NP      . pantoprazole (PROTONIX) EC tablet 40 mg  40 mg Oral Daily Aldean Baker, NP   40 mg at 01/05/20 0843  . QUEtiapine (SEROQUEL) tablet 50 mg  50 mg Oral QHS Aldean Baker, NP   50 mg at 01/04/20 2147  . tamsulosin (FLOMAX) capsule 0.4 mg  0.4 mg Oral QPC supper Antonieta Pert, MD   0.4 mg at 01/05/20 0844  . terazosin (HYTRIN) capsule 1 mg  1 mg Oral QHS Aldean Baker, NP   1 mg at 01/04/20 2147  . thiamine tablet 100 mg  100 mg Oral Daily Aldean Baker, NP   100 mg at 01/05/20 0843  . traZODone (DESYREL) tablet 50 mg  50 mg Oral QHS PRN Aldean Baker, NP   50 mg at 01/03/20 2150    Lab Results:  Results for orders placed or performed during the hospital encounter of 01/03/20 (from the past 48 hour(s))  Respiratory Panel by RT PCR (Flu A&B, Covid) - Nasopharyngeal Swab     Status: None   Collection Time: 01/03/20 12:05 PM   Specimen: Nasopharyngeal Swab  Result Value Ref Range   SARS Coronavirus 2 by RT PCR NEGATIVE NEGATIVE    Comment: (NOTE) SARS-CoV-2 target nucleic acids are NOT DETECTED. The SARS-CoV-2 RNA is generally detectable in upper respiratoy specimens during the acute phase of infection. The lowest concentration of SARS-CoV-2 viral copies this assay can detect is 131 copies/mL. A negative result does not preclude SARS-Cov-2 infection and should not be used as the sole basis for treatment or other patient management decisions. A negative result may occur with  improper specimen collection/handling, submission of specimen other than nasopharyngeal swab, presence of viral mutation(s) within the areas targeted by this assay, and inadequate number of viral  copies (<131 copies/mL). A negative result must be combined with clinical observations, patient history, and epidemiological information. The expected result is Negative. Fact Sheet for Patients:  https://www.moore.com/ Fact Sheet for Healthcare Providers:  https://www.young.biz/ This test is not yet ap proved or cleared by the [place] FDA and  has been authorized for detection and/or diagnosis of SARS-CoV-2 by FDA under an Emergency Use Authorization (EUA). This EUA will remain  in effect (meaning this test can be used) for the duration of the COVID-19 declaration under Section 564(b)(1) of the Act, 21 U.S.C. section 360bbb-3(b)(1), unless the authorization is terminated or revoked sooner.    Influenza A by PCR NEGATIVE NEGATIVE   Influenza B by PCR NEGATIVE NEGATIVE    Comment: (NOTE) The Xpert Xpress SARS-CoV-2/FLU/RSV assay is intended as an aid in  the diagnosis of influenza from Nasopharyngeal swab specimens and  should not be used as a sole basis for treatment. Nasal washings and  aspirates are unacceptable for Xpert Xpress SARS-CoV-2/FLU/RSV  testing. Fact Sheet for Patients: https://www.moore.com/ Fact Sheet for Healthcare Providers: https://www.young.biz/ This test is not yet approved or cleared by the [place] FDA and  has been authorized for detection and/or diagnosis of SARS-CoV-2 by  FDA under an Emergency Use Authorization (EUA). This EUA will remain  in effect (meaning this test can be used) for the duration of the  Covid-19 declaration under Section 564(b)(1) of the Act, 21  U.S.C. section 360bbb-3(b)(1), unless the authorization is  terminated or revoked. Performed at [facility], [address], [place], [place] [address]   Urinalysis, Routine w reflex microscopic     Status: Abnormal  Collection Time: 01/03/20 12:23 PM  Result Value Ref Range    Color, Urine YELLOW YELLOW   APPearance CLEAR CLEAR   Specific Gravity, Urine 1.014 1.005 - 1.030   pH 6.0 5.0 - 8.0   Glucose, UA NEGATIVE NEGATIVE mg/dL   Hgb urine dipstick NEGATIVE NEGATIVE   Bilirubin Urine NEGATIVE NEGATIVE   Ketones, ur NEGATIVE NEGATIVE mg/dL   Protein, ur NEGATIVE NEGATIVE mg/dL   Nitrite NEGATIVE NEGATIVE   Leukocytes,Ua TRACE (A) NEGATIVE   RBC / HPF 0-5 0 - 5 RBC/hpf   WBC, UA 0-5 0 - 5 WBC/hpf   Bacteria, UA RARE (A) NONE SEEN   Squamous Epithelial / LPF 0-5 0 - 5    Comment: Performed at [facility], [address], [place], [place] [address]  Urine rapid drug screen (hosp performed)not at [facility]     Status: Abnormal   Collection Time: 01/03/20 12:23 PM  Result Value Ref Range   Opiates NONE DETECTED NONE DETECTED   Cocaine POSITIVE (A) NONE DETECTED   Benzodiazepines NONE DETECTED NONE DETECTED   Amphetamines NONE DETECTED NONE DETECTED   Tetrahydrocannabinol NONE DETECTED NONE DETECTED   Barbiturates NONE DETECTED NONE DETECTED    Comment: (NOTE) DRUG SCREEN FOR MEDICAL PURPOSES ONLY.  IF CONFIRMATION IS NEEDED FOR ANY PURPOSE, NOTIFY LAB WITHIN 5 DAYS. LOWEST DETECTABLE LIMITS FOR URINE DRUG SCREEN Drug Class                     Cutoff (ng/mL) Amphetamine and metabolites    1000 Barbiturate and metabolites    200 Benzodiazepine                 200 Tricyclics and metabolites     300 Opiates and metabolites        300 Cocaine and metabolites        300 THC                            50 Performed at [facility], [address], [place], [place] [address]   CBC     Status: None   Collection Time: 01/04/20  6:37 AM  Result Value Ref Range   WBC 5.5 4.0 - 10.5 K/uL   RBC 5.18 4.22 - 5.81 MIL/uL   Hemoglobin 16.2 13.0 - 17.0 g/dL   HCT 09.8 11.9 - 14.7 %   MCV 90.9 80.0 - 100.0 fL   MCH 31.3 26.0 - 34.0 pg   MCHC 34.4 30.0 - 36.0 g/dL   RDW 82.9 56.2 - 13.0 %   Platelets 239 150 - 400 K/uL   nRBC  0.0 0.0 - 0.2 %    Comment: Performed at [facility], [address] [place], [place] [address]  Comprehensive metabolic panel     Status: Abnormal   Collection Time: 01/04/20  6:37 AM  Result Value Ref Range   Sodium 144 135 - 145 mmol/L   Potassium 3.2 (L) 3.5 - 5.1 mmol/L   Chloride 108 98 - 111 mmol/L   CO2 28 22 - 32 mmol/L   Glucose, Bld 95 70 - 99 mg/dL    Comment: Glucose reference range applies only to samples taken after fasting for at least 8 hours.   BUN 12 6 - 20 mg/dL   Creatinine, Ser 4.69 0.61 - 1.24 mg/dL   Calcium 8.7 (L) 8.9 - 10.3 mg/dL   Total Protein 6.8  6.5 - 8.1 g/dL   Albumin 3.8 3.5 - 5.0 g/dL   AST 20 15 - 41 U/L   ALT 23 0 - 44 U/L   Alkaline Phosphatase 56 38 - 126 U/L   Total Bilirubin 0.7 0.3 - 1.2 mg/dL   GFR calc non Af Amer >60 >60 mL/min   GFR calc Af Amer >60 >60 mL/min   Anion gap 8 5 - 15    Comment: Performed at [facility], [address] [place], [place] [address]  Hemoglobin A1c     Status: None   Collection Time: 01/04/20  6:37 AM  Result Value Ref Range   Hgb A1c MFr Bld 5.4 4.8 - 5.6 %    Comment: (NOTE) Pre diabetes:          5.7%-6.4% Diabetes:              >6.4% Glycemic control for   <7.0% adults with diabetes    Mean Plasma Glucose 108.28 mg/dL    Comment: Performed at [facility], [address], [place], [place] [address]  Ethanol     Status: None   Collection Time: 01/04/20  6:37 AM  Result Value Ref Range   Alcohol, Ethyl (B) <10 <10 mg/dL    Comment: (NOTE) Lowest detectable limit for serum alcohol is 10 mg/dL. For medical purposes only. Performed at [facility], [address], [place], [place] [address]   Lipid panel     Status: Abnormal   Collection Time: 01/04/20  6:37 AM  Result Value Ref Range   Cholesterol 162 0 - 200 mg/dL   Triglycerides 89 <914 mg/dL   HDL 38 (L) >78 mg/dL   Total CHOL/HDL Ratio 4.3 RATIO   VLDL 18 0 - 40 mg/dL    LDL Cholesterol 295 (H) 0 - 99 mg/dL    Comment:        Total Cholesterol/HDL:CHD Risk Coronary Heart Disease Risk Table                     Men   Women  1/2 Average Risk   3.4   3.3  Average Risk       5.0   4.4  2 X Average Risk   9.6   7.1  3 X Average Risk  23.4   11.0        Use the calculated Patient Ratio above and the CHD Risk Table to determine the patient's CHD Risk.        ATP III CLASSIFICATION (LDL):  <100     mg/dL   Optimal  621-308  mg/dL   Near or Above                    Optimal  130-159  mg/dL   Borderline  657-846  mg/dL   High  >962     mg/dL   Very High Performed at [facility], [address] [place], [place] [address]   TSH     Status: None   Collection Time: 01/04/20  6:37 AM  Result Value Ref Range   TSH 3.077 0.350 - 4.500 uIU/mL    Comment: Performed by a 3rd Generation assay with a functional sensitivity of <=0.01 uIU/mL. Performed at [facility], [address] [place], [place] [address]     Blood Alcohol level:  Lab Results  Component Value Date   ETH <10 01/04/2020   ETH <  10 08/19/2019    Metabolic Disorder Labs: Lab Results  Component Value Date   HGBA1C 5.4 01/04/2020   MPG 108.28 01/04/2020   MPG 102.54 08/11/2019   Lab Results  Component Value Date   PROLACTIN 18.7 (H) 08/11/2019   Lab Results  Component Value Date   CHOL 162 01/04/2020   TRIG 89 01/04/2020   HDL 38 (L) 01/04/2020   CHOLHDL 4.3 01/04/2020   VLDL 18 01/04/2020   LDLCALC 106 (H) 01/04/2020   LDLCALC 100 (H) 08/11/2019    Physical Findings: AIMS:  , ,  ,  ,    CIWA:  CIWA-Ar Total: 0 COWS:     Musculoskeletal: Strength & Muscle Tone: within normal limits Gait & Station: normal Patient leans: N/A  Psychiatric Specialty Exam: Physical Exam  Nursing note and vitals reviewed. Constitutional: He is oriented to person, place, and time. He appears well-developed and well-nourished.  HENT:  Head:  Normocephalic and atraumatic.  Respiratory: Effort normal.  Neurological: He is alert and oriented to person, place, and time.    Review of Systems  Blood pressure 124/83, pulse (!) 102, temperature (!) 97.4 F (36.3 C), temperature source Oral, resp. rate 20, height 6\' 1"  (1.854 m), weight 113.4 kg, SpO2 96 %.Body mass index is 32.98 kg/m.  General Appearance: Casual  Eye Contact:  Good  Speech:  Normal Rate  Volume:  Normal  Mood:  Euthymic  Affect:  Congruent  Thought Process:  Coherent and Descriptions of Associations: Intact  Orientation:  Full (Time, Place, and Person)  Thought Content:  Logical  Suicidal Thoughts:  No  Homicidal Thoughts:  No  Memory:  Immediate;   Fair Recent;   Fair Remote;   Fair  Judgement:  Intact  Insight:  Fair  Psychomotor Activity:  Normal  Concentration:  Concentration: Good and Attention Span: Good  Recall:  Good  Fund of Knowledge:  Good  Language:  Good  Akathisia:  Negative  Handed:  Right  AIMS (if indicated):     Assets:  Desire for Improvement Resilience  ADL's:  Intact  Cognition:  WNL  Sleep:  Number of Hours: 6     Treatment Plan Summary: Daily contact with patient to assess and evaluate symptoms and progress in treatment, Medication management and Plan : Patient is seen and examined.  Patient is a 54 year old male with the above-stated past psychiatric history who is seen in follow-up.   Diagnosis: #1 cocaine dependence, #2 unspecified bipolar disorder, #3 alcohol dependence, #4 essential hypertension, #5 benign prostatic hypertrophy, #6 seasonal allergies, #7 GERD  Patient is seen in follow-up.  He is doing fine.  He is back on his medications.  His sleep is improved.  He is not suicidal or homicidal.  He is having some odd facial issues that he has difficulty describing.  At least I can say that it is not within any nerve distribution pattern that might suggest a neurological origin.  No change in his medications today except  I will stop the Ativan given no withdrawal symptoms from alcohol.  I would anticipate that he would either go to the [facility] or to the [facility] when available.  1.  Continue amlodipine 10 mg p.o. daily for essential hypertension. 2.  Continue Cymbalta 20 mg p.o. daily for anxiety and depression. 3.  Continue Flonase 2 sprays in each nostril daily for seasonal allergies. 4.  Continue folic acid 1 mg p.o. daily for nutritional supplementation. 5.  Continue Neurontin 100 mg  p.o. 3 times daily for mood stability and anxiety. 6.  Continue hydroxyzine 25 mg p.o. every 6 hours as needed anxiety. 7.  Stop lorazepam. 8.  Continue multivitamin 1 tablet p.o. daily for nutritional supplementation. 9.  Continue Zofran 4 mg p.o. every 6 hours as needed nausea or vomiting. 10.  Continue Protonix 40 mg p.o. daily for GERD. 11.  Continue Seroquel 50 mg p.o. nightly for mood stability and sleep. 12.  Continue Flomax 0.4 mg p.o. every afternoon for BPH. 13.  Continue to Hytrin 1 mg p.o. nightly for PTSD symptoms and BPH. 14.  Continue thiamine 100 mg p.o. daily for nutritional supplementation. 15.  Continue trazodone 50 mg p.o. nightly as needed insomnia. 16.  Disposition planning-in progress.  Antonieta Pert, MD 01/05/2020, 11:12 AM

## 2020-01-05 NOTE — Progress Notes (Signed)
Pt c/o nasal stuffiness this morning. States that he did not sleep well at all last night. "Every time I breathe in, something is blocking the air through my nose. It's dry." Recommended pt inform provider so that he can provider medication if needed.
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
## 2020-01-06 MED ORDER — QUETIAPINE FUMARATE 50 MG PO TABS: 50.0000 mg | ORAL_TABLET | Freq: Every day | ORAL | 0 refills | Status: DC

## 2020-01-06 MED ORDER — HYDROXYZINE HCL 25 MG PO TABS: 25.0000 mg | ORAL_TABLET | Freq: Four times a day (QID) | ORAL | 0 refills | Status: DC | PRN

## 2020-01-06 MED ORDER — GABAPENTIN 100 MG PO CAPS: 100.0000 mg | ORAL_CAPSULE | Freq: Three times a day (TID) | ORAL | 0 refills | Status: DC

## 2020-01-06 MED ORDER — TRAZODONE HCL 50 MG PO TABS: 50.0000 mg | ORAL_TABLET | Freq: Every evening | ORAL | 0 refills | Status: DC | PRN

## 2020-01-06 MED ORDER — DULOXETINE HCL 20 MG PO CPEP: 20.0000 mg | ORAL_CAPSULE | Freq: Every day | ORAL | 0 refills | Status: DC

## 2020-01-06 NOTE — BHH Suicide Risk Assessment (Signed)
[facility] Discharge Suicide Risk Assessment   Principal Problem: <principal problem not specified> Discharge Diagnoses: Active Problems:   MDD (major depressive disorder), recurrent, severe, with psychosis (HCC)   Total Time spent with patient: 15 minutes  Musculoskeletal: Strength & Muscle Tone: within normal limits Gait & Station: normal Patient leans: N/A  Psychiatric Specialty Exam: Review of Systems  All other systems reviewed and are negative.   Blood pressure 139/89, pulse 98, temperature 97.8 F (36.6 C), resp. rate 16, height 6\' 1"  (1.854 m), weight 113.4 kg, SpO2 96 %.Body mass index is 32.98 kg/m.  General Appearance: Casual  Eye Contact::  Good  Speech:  Normal Rate409  Volume:  Normal  Mood:  Euthymic  Affect:  Congruent  Thought Process:  Coherent and Descriptions of Associations: Intact  Orientation:  Full (Time, Place, and Person)  Thought Content:  Logical  Suicidal Thoughts:  No  Homicidal Thoughts:  No  Memory:  Immediate;   Good Recent;   Good Remote;   Good  Judgement:  Intact  Insight:  Fair  Psychomotor Activity:  Normal  Concentration:  Fair  Recall:  Fair  Fund of Knowledge:Good  Language: Good  Akathisia:  Negative  Handed:  Right  AIMS (if indicated):     Assets:  Communication Skills Desire for Improvement Resilience  Sleep:  Number of Hours: 6  Cognition: WNL  ADL's:  Intact   Mental Status Per Nursing Assessment::   On Admission:  Self-harm thoughts, Suicidal ideation indicated by patient  Demographic Factors:  Male, Low socioeconomic status, Living alone and Unemployed  Loss Factors: Financial problems/change in socioeconomic status  Historical Factors: Impulsivity  Risk Reduction Factors:   Religious beliefs about death  Continued Clinical Symptoms:  Bipolar Disorder:   Mixed State Alcohol/Substance Abuse/Dependencies  Cognitive Features That Contribute To Risk:  None    Suicide Risk:  Minimal: No identifiable  suicidal ideation.  Patients presenting with no risk factors but with morbid ruminations; may be classified as minimal risk based on the severity of the depressive symptoms  Follow-up Information    [facility] Follow up.   Contact information: [address] [place], [place]   P: [phone] F: [phone]       [phone], [employer] Call.   Contact information: [address] [place] [phone]           Plan Of Care/Follow-up recommendations:  Activity:  ad lib  [phone], MD 01/06/2020, 8:05 AM

## 2020-01-06 NOTE — Progress Notes (Signed)
Recreation Therapy Notes  Date:  4.23.21 Time: 0930 Location: 300 Hall Group Room  Group Topic: Stress Management  Goal Area(s) Addresses:  Patient will identify positive stress management techniques. Patient will identify benefits of using stress management post d/c.  Intervention: Stress Management  Activity: Meditation.  LRT played a meditation that focused on taking on the characteristics of a mountain.  Patients were to listen and follow along as meditation played to engage in activity.  Education:  Stress Management, Discharge Planning.   Education Outcome: Acknowledges Education  Clinical Observations/Feedback:  Pt did not attend group.     Caroll Rancher, LRT/CTRS         Caroll Rancher A 01/06/2020 11:38 AM

## 2020-01-06 NOTE — BHH Suicide Risk Assessment (Cosign Needed)
BHH INPATIENT:  Family/Significant Other Suicide Prevention Education  Suicide Prevention Education:  Education Completed; with brother-in-law, Paul Blair [phone]) has been identified by the patient as the family member/significant other with whom the patient will be residing, and identified as the person(s) who will aid the patient in the event of a mental health crisis (suicidal ideations/suicide attempt).  With written consent from the patient, the family member/significant other has been provided the following suicide prevention education, prior to the and/or following the discharge of the patient.  The suicide prevention education provided includes the following:  Suicide risk factors  Suicide prevention and interventions  National Suicide Hotline telephone number  [facility] assessment telephone number  [facility] Emergency Assistance 911  [facility] and/or Residential Mobile Crisis Unit telephone number  Request made of family/significant other to:  Remove weapons (e.g., guns, rifles, knives), all items previously/currently identified as safety concern.    Remove drugs/medications (over-the-counter, prescriptions, illicit drugs), all items previously/currently identified as a safety concern.  The family member/significant other verbalizes understanding of the suicide prevention education information provided.  The family member/significant other agrees to remove the items of safety concern listed above.  Pt's brother-in-law asked about if he is doing okay and asked about what the [facility] is. No other questions.    Paul Blair 01/06/2020, 9:58 AM

## 2020-01-06 NOTE — Progress Notes (Signed)
  [facility] Adult Case Management Discharge Plan :  Will you be returning to the same living situation after discharge:  Yes,  patient is returning to the [facility] his shelter bed At discharge, do you have transportation home?: Yes,  bus passes provided Do you have the ability to pay for your medications: Yes,  UHC Medicare and Medicaid  Release of information consent forms completed and in the chart;  Patient's signature needed at discharge.  Patient to Follow up at: Follow-up Information    [facility] Follow up on 01/11/2020.   Why: You are scheduled for an appointment on 01/11/20 at 9:00 am.  This will be a virtual tele-health appointment. Please be sure to have your discharge paperwork available.  Contact information: [address] [place] [address] [phone]           Next level of care provider has access to [facility] Link:yes  Safety Planning and Suicide Prevention discussed: Yes,  with the patient     Has patient been referred to the Quitline?: Patient refused referral  Patient has been referred for addiction treatment: Pt. refused referral  Maeola Sarah, LCSWA 01/06/2020, 10:34 AM

## 2020-01-06 NOTE — Progress Notes (Signed)
D:  Patient's self inventory sheet, pateint has fair sleep, sleep medication helpful.  Good appetite, low energy level, good concentration.  Rated depression 5, denied hopeless and anxiety.  Denied withdrawals.  Denied SI.  Denied physical problems.  Physical pain, back, worst pain #5 in past 24 hours.  Goal is discharge.  Plans to discuss transportation. A:  Medications administered per MD orders.  Emotional support and encouragement given patient. R:  Denied SI and HI, contracts for safety.  Voices have decreased.  Denied visual hallucinations.  Safety maintained with 15 minute checks.

## 2020-01-06 NOTE — Progress Notes (Signed)
Patient was accepted to [facility] for today, however he is now declining any residential treatment. Patient reports he would like to return to the [facility]) with outpatient follow up.    CSW will continue to follow for a secure discharge plan.      Paul Blair, MSW, LCSW Clinical Social Worker [facility]  Phone: [phone]

## 2020-01-06 NOTE — Progress Notes (Signed)
   01/05/20 2300  Psych Admission Type (Psych Patients Only)  Admission Status Voluntary  Psychosocial Assessment  Patient Complaints Substance abuse  Eye Contact Fair  Facial Expression Animated  Affect Anxious  Speech Logical/coherent  Interaction Assertive  Motor Activity Other (Comment) (WNL)  Appearance/Hygiene Unremarkable  Behavior Characteristics Cooperative  Mood Pleasant  Thought Process  Coherency WDL  Content WDL  Delusions None reported or observed  Perception Hallucinations  Hallucination Auditory (just about gone per pt)  Judgment Poor  Confusion None  Danger to Self  Current suicidal ideation? Denies  Danger to Others  Danger to Others None reported or observed

## 2020-01-06 NOTE — Progress Notes (Signed)
  [facility] Adult Case Management Discharge Plan :  Will you be returning to the same living situation after discharge:  No. [facility]  At discharge, do you have transportation home?: No. Bus  Do you have the ability to pay for your medications: Yes,  medicare  Release of information consent forms completed and in the chart;  Patient's signature needed at discharge.  Patient to Follow up at: Follow-up Information    [facility] Follow up on 01/11/2020.   Why: You are scheduled for an appointment on 01/11/20 at 9:00 am.  This will be a virtual tele-health appointment. Please be sure to have your discharge paperwork available.  Contact information: [address] [place] [address] [phone]           Next level of care provider has access to [facility] Link:no  Safety Planning and Suicide Prevention discussed: Yes,  with brother-in-law     Has patient been referred to the Quitline?: Patient refused referral  Patient has been referred for addiction treatment: Yes  Reynold Bowen, Student-Social Work 01/06/2020, 10:45 AM

## 2020-01-06 NOTE — Discharge Summary (Signed)
Physician Discharge Summary Note  Patient:  Paul Blair is an 54 y.o., male  MRN:  [id number]  DOB:  13-Feb-1966  Patient phone:  [phone] (home)   Patient address:   [address] [place] [address],   Total Time spent with patient: Greater than 30 minutes  Date of Admission:  01/03/2020  Date of Discharge: 01/06/20  Reason for Admission: Suicidal ideations, homelessness & substance use issues.   Principal Problem: MDD (major depressive disorder), recurrent, severe, with psychosis (HCC)  Discharge Diagnoses: Principal Problem:   MDD (major depressive disorder), recurrent, severe, with psychosis (HCC)  Past Psychiatric History: Mental illness.                                            Two previous psychiatric adm.                                                                                 Past Medical History:  Past Medical History:  Diagnosis Date  . Cocaine abuse (HCC)   . Depression   . History of ETOH abuse   . Homicidal ideations   . Hypertension   . Liver cirrhosis (HCC)   . Substance induced mood disorder (HCC)   . Suicidal ideations    History reviewed. No pertinent surgical history.  Family History: History reviewed. No pertinent family history.  Family Psychiatric  History: See H&P  Social History:  Social History   Substance and Sexual Activity  Alcohol Use Yes   Comment: hx abuse- incarcerated at present     Social History   Substance and Sexual Activity  Drug Use Yes  . Types: Cocaine, Marijuana    Social History   Socioeconomic History  . Marital status: Divorced    Spouse name: Not on file  . Number of children: Not on file  . Years of education: Not on file  . Highest education level: Not on file  Occupational History  . Not on file  Tobacco Use  . Smoking status: Current Every Day Smoker  . Smokeless tobacco: Never Used  Substance and Sexual Activity  . Alcohol use: Yes    Comment: hx abuse- incarcerated at  present  . Drug use: Yes    Types: Cocaine, Marijuana  . Sexual activity: Not on file  Other Topics Concern  . Not on file  Social History Narrative  . Not on file   Social Determinants of Health   Financial Resource Strain:   . Difficulty of Paying Living Expenses:   Food Insecurity:   . Worried About Programme researcher, broadcasting/film/video in the Last Year:   . Barista in the Last Year:   Transportation Needs:   . Freight forwarder (Medical):   Marland Kitchen Lack of Transportation (Non-Medical):   Physical Activity:   . Days of Exercise per Week:   . Minutes of Exercise per Session:   Stress:   . Feeling of Stress :   Social Connections:   . Frequency of Communication with Friends and Family:   .  Frequency of Social Gatherings with Friends and Family:   . Attends Religious Services:   . Active Member of Clubs or Organizations:   . Attends Archivist Meetings:   Marland Kitchen Marital Status:    Hospital Course: (Per Md's admission evaluation): Patient is a 54 year old male with a reported past psychiatric history significant for bipolar disorder versus schizoaffective disorder and cocaine dependence.  The patient presented as a walk-in patient to the behavioral health hospital on 01/03/2020 wanting to get back on his medications.  He informed those in the evaluation he was suicidal.  The patient stated initially that he had been off his medications for approximately 3 months.  Unfortunately this was not true.  The patient had been hospitalized at [facility] on approximately March 3.  He was seen there and had reported 12 shots of alcohol, 640 ounce beers and a bottle of wine a day as well as cocaine.  Today he states he has had 50 days of sobriety, but I am unable to quantitate that myself.  It was felt at that time at [facility] that this was a degree of malingering.  He had stated at that time he was going to follow-up with [facility] or [facility] for residential treatment.  Today he states that he has been at  [facility] for approximately 3 days, and he hopes to return there.  He also stated he had been staying with his sister who was unable to cope with his drug use, and apparently asked him to leave.  He was admitted to the hospital for evaluation and stabilization.  After evaluation of his presenting symptoms, [facility] was recommended for mood stabilization treatments. The medication regimen for his presenting symptoms were discussed & with his consent initiated. He received, stabilized & was discharged on the medications as listed below on his discharge medication lists. He was also enrolled & participated in the group counseling sessions being offered & held on this unit. He learned coping skills. He presented on this admission, other chronic medical conditions that required treatment & monitoring. He was resumed/discharged on all his pertinent home medications for those health issues. He tolerated his treatment regimen without any adverse effects or reactions reported.   Ladainian's symptoms responded well to his treatment regimen. This is evidenced by his reports of improved mood & absence of suicidal ideations. He is currently mentally & medically stable to be discharged to continue mental health care on an outpatient basis as noted below. During the course of his hospitalization, the 15-minute checks were adequate to ensure Hulbert's safety.  Patient did not display any dangerous, violent or suicidal behavior on the unit.  He interacted with patients & staff appropriately, participated appropriately in the group sessions/therapies. His medications were addressed & adjusted to meet his needs. He was recommended for outpatient follow-up care & medication management upon discharge to assure his continuity of care.  At the time of this hospital discharge, patient is not reporting any acute suicidal/homicidal ideations. He feels more confident about his mental state. He currently denies any new issues or concerns. Education  and supportive counseling provided throughout his hospital stay & upon discharge.   Today upon his discharge evaluation with the attending psychiatrist, Kline shares he is doing well. He denies any other specific concerns. He is sleeping well. His appetite is good. He denies other physical complaints. He denies AH/VH. He feels that his medications have been helpful & is in agreement to continue his current treatment regimen as recommended.  He was able to engage in safety planning including plan to return to [facility] or contact emergency services if he feels unable to maintain his own safety or the safety of others. Pt had no further questions, comments, or concerns. He left [facility] with all personal belongings in no apparent distress. Transportation per the city bus transportation services. [facility] assisted with bus pass.   Physical Findings: AIMS:  , ,  ,  ,    CIWA:  CIWA-Ar Total: 0 COWS:     Musculoskeletal: Strength & Muscle Tone: within normal limits Gait & Station: normal Patient leans: N/A  Psychiatric Specialty Exam: Physical Exam  Nursing note and vitals reviewed. Constitutional: He is oriented to person, place, and time. He appears well-developed and well-nourished.  Cardiovascular: Normal rate.  Respiratory: Effort normal.  Neurological: He is alert and oriented to person, place, and time.    Review of Systems  Constitutional: Negative.   Respiratory: Negative for cough and shortness of breath.   Cardiovascular: Negative for chest pain.  Psychiatric/Behavioral: Positive for depression (stable on medication) and substance abuse (cocaine). Negative for hallucinations and suicidal ideas. The patient is not nervous/anxious and does not have insomnia.     Blood pressure 139/89, pulse 98, temperature 97.8 F (36.6 C), resp. rate 16, height 6\' 1"  (1.854 m), weight 113.4 kg, SpO2 96 %.Body mass index is 32.98 kg/m.  See MD's discharge SRA   Has this patient used any form of tobacco in the last  30 days? (Cigarettes, Smokeless Tobacco, Cigars, and/or Pipes)  No  Blood Alcohol level:  Lab Results  Component Value Date   ETH <10 01/04/2020   ETH <10 08/19/2019   Metabolic Disorder Labs:  Lab Results  Component Value Date   HGBA1C 5.4 01/04/2020   MPG 108.28 01/04/2020   MPG 102.54 08/11/2019   Lab Results  Component Value Date   PROLACTIN 18.7 (H) 08/11/2019   Lab Results  Component Value Date   CHOL 162 01/04/2020   TRIG 89 01/04/2020   HDL 38 (L) 01/04/2020   CHOLHDL 4.3 01/04/2020   VLDL 18 01/04/2020   LDLCALC 106 (H) 01/04/2020   LDLCALC 100 (H) 08/11/2019   See Psychiatric Specialty Exam and Suicide Risk Assessment completed by Attending Physician prior to discharge.  Discharge destination:  Home  Is patient on multiple antipsychotic therapies at discharge:  No   Has Patient had three or more failed trials of antipsychotic monotherapy by history:  No  Recommended Plan for Multiple Antipsychotic Therapies: NA  Allergies as of 01/06/2020   No Known Allergies     Medication List    STOP taking these medications   losartan 50 MG tablet Commonly known as: COZAAR   vitamin E 200 UNIT capsule   ZINC 15 PO     TAKE these medications     Indication  albuterol 108 (90 Base) MCG/ACT inhaler Commonly known as: VENTOLIN HFA Inhale 1-2 puffs into the lungs every 4 (four) hours as needed for shortness of breath.  Indication: Chronic Obstructive Lung Disease   amLODipine 10 MG tablet Commonly known as: NORVASC Take 1 tablet (10 mg total) by mouth daily.  Indication: High Blood Pressure Disorder   cholecalciferol 25 MCG (1000 UNIT) tablet Commonly known as: VITAMIN D3 Take 1,000 Units by mouth daily.  Indication: Vitamin D Deficiency   DULoxetine 20 MG capsule Commonly known as: CYMBALTA Take 1 capsule (20 mg total) by mouth daily. For depression Start taking on: January 07, 2020 What changed:  medication strength  how much to  take  additional instructions  Indication: Major Depressive Disorder   fluticasone 50 MCG/ACT nasal spray Commonly known as: FLONASE Place 1 spray into both nostrils daily as needed for allergies.  Indication: Allergic Rhinitis   gabapentin 100 MG capsule Commonly known as: NEURONTIN Take 1 capsule (100 mg total) by mouth 3 (three) times daily. For agitation  Indication: Agitation   hydrOXYzine 25 MG tablet Commonly known as: ATARAX/VISTARIL Take 1 tablet (25 mg total) by mouth every 6 (six) hours as needed. For anxiety  Indication: Feeling Anxious   pantoprazole 40 MG tablet Commonly known as: PROTONIX Take 1 tablet (40 mg total) by mouth daily.  Indication: Gastroesophageal Reflux Disease   QUEtiapine 50 MG tablet Commonly known as: SEROQUEL Take 1 tablet (50 mg total) by mouth at bedtime. For mood control What changed: additional instructions  Indication: Mood control   tamsulosin 0.4 MG Caps capsule Commonly known as: FLOMAX Take 0.4 mg by mouth daily.  Indication: Benign Enlargement of Prostate   terazosin 1 MG capsule Commonly known as: HYTRIN Take 1 capsule (1 mg total) by mouth at bedtime.  Indication: Benign Enlargement of Prostate   traZODone 50 MG tablet Commonly known as: DESYREL Take 1 tablet (50 mg total) by mouth at bedtime as needed for sleep.  Indication: Trouble Sleeping   vitamin B-12 1000 MCG tablet Commonly known as: CYANOCOBALAMIN Take 1,000 mcg by mouth daily.  Indication: Vitamin B12 Absorption Study      Follow-up Information    [facility] up.   Contact information: [address] [place] [address] [phone]          Follow-up recommendations: Activity:  As tolerated Diet: As recommended by your primary care doctor. Keep all scheduled follow-up appointments as recommended.  Comments: Prescriptions given at discharge.  Patient agreeable to plan.  Given opportunity to ask questions.  Appears to feel comfortable  with discharge denies any current suicidal or homicidal thought. Patient is also instructed prior to discharge to: Take all medications as prescribed by his/her mental healthcare provider. Report any adverse effects and or reactions from the medicines to his/her outpatient provider promptly. Patient has been instructed & cautioned: To not engage in alcohol and or illegal drug use while on prescription medicines. In the event of worsening symptoms, patient is instructed to call the crisis hotline, 911 and or go to the nearest ED for appropriate evaluation and treatment of symptoms. To follow-up with his/her primary care provider for your other medical issues, concerns and or health care needs.    Signed: Armandina Stammer, NP, PMHNP, FNP-BC 01/06/2020, 9:49 AM

## 2020-01-06 NOTE — Progress Notes (Signed)
Discharge Note:  Patient discharged home with family member.  Patient denied SI and HI.  Denied A/V hallucinations.  Suicide prevention information given and discussed with patient who stated he understood and had no questions.  Patient stated he received all his belongings, clothing, toiletries, misc items, etc.  Patient stated he appreciated all assistance received from [facility] staff.  All required discharge information given to patient at discharge.  

## 2020-01-25 ENCOUNTER — Encounter: Payer: Self-pay | Admitting: Critical Care Medicine

## 2020-01-25 ENCOUNTER — Other Ambulatory Visit: Payer: Self-pay | Admitting: Critical Care Medicine

## 2020-01-25 MED ORDER — POTASSIUM CHLORIDE CRYS ER 20 MEQ PO TBCR: 20.0000 meq | EXTENDED_RELEASE_TABLET | Freq: Every day | ORAL | 3 refills | Status: DC

## 2020-01-25 MED ORDER — DULOXETINE HCL 20 MG PO CPEP: 20.0000 mg | ORAL_CAPSULE | Freq: Every day | ORAL | 0 refills | Status: DC

## 2020-01-25 MED ORDER — VITAMIN D 25 MCG (1000 UNIT) PO TABS: 1000.0000 [IU] | ORAL_TABLET | Freq: Every day | ORAL | 1 refills | Status: DC

## 2020-01-25 MED ORDER — FINASTERIDE 5 MG PO TABS: 5.0000 mg | ORAL_TABLET | Freq: Every day | ORAL | 3 refills | Status: DC

## 2020-01-25 MED ORDER — TAMSULOSIN HCL 0.4 MG PO CAPS: 0.4000 mg | ORAL_CAPSULE | Freq: Every day | ORAL | 3 refills | Status: DC

## 2020-01-25 NOTE — Progress Notes (Signed)
Med refills

## 2020-01-27 NOTE — Congregational Nurse Program (Signed)
Mr. Vanwingerden was referred to [facility] of the [place] for medication management. Client will followed up with [facility]. Bus pass given for client to pick up Rx. CN will secure an appointment at [facility] for f/u with Dr. Delford Field.
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
## 2020-01-30 ENCOUNTER — Ambulatory Visit: Payer: Medicaid Other | Admitting: Critical Care Medicine

## 2020-02-06 ENCOUNTER — Ambulatory Visit: Payer: Medicare Other | Admitting: Critical Care Medicine

## 2020-02-15 ENCOUNTER — Encounter: Payer: Self-pay | Admitting: Critical Care Medicine

## 2020-02-15 ENCOUNTER — Other Ambulatory Visit: Payer: Self-pay | Admitting: Critical Care Medicine

## 2020-02-15 MED ORDER — AMLODIPINE BESYLATE 10 MG PO TABS: 10.0000 mg | ORAL_TABLET | Freq: Every day | ORAL | 1 refills | Status: DC

## 2020-02-15 MED ORDER — CLOTRIMAZOLE 1 % EX CREA: 1.0000 "application " | TOPICAL_CREAM | Freq: Two times a day (BID) | CUTANEOUS | 0 refills | Status: DC

## 2020-02-15 NOTE — Progress Notes (Signed)
Patient ID: Paul Blair, male   DOB: September 06, 1966, 54 y.o.   MRN: [id number] This is a 54 year old male seen previously in the [facility] clinic but failed his [facility] visit.  He has history of hypertension complains of athlete's foot.  He is run out of his blood pressure medicine amlodipine.  On exam blood pressure 150/82 pulse is 72 saturation 97% room air chest was clear abdomen unremarkable feet showed bilateral tinea pedis  Plan is to reduce represcribed Norvasc 10 mg daily and also clotrimazole cream for the feet we also gave him foot moisturizers and will obtain for the patient a follow-up clinic visit to establish for primary care he will also need a urology referral for benign prostatic hypertrophy and he will maintain Flomax and Proscar for now

## 2020-02-16 NOTE — Congregational Nurse Program (Signed)
Client was seen at [facility] clinic for tx for athletes ft infection. Awaiting call back for appointment at [facility] and Urology referral.

## 2020-02-21 ENCOUNTER — Other Ambulatory Visit: Payer: Self-pay | Admitting: Critical Care Medicine

## 2020-02-21 MED ORDER — AMLODIPINE BESYLATE 10 MG PO TABS: 10.0000 mg | ORAL_TABLET | Freq: Every day | ORAL | 1 refills | Status: DC

## 2020-02-21 NOTE — Progress Notes (Signed)
Med refill

## 2020-03-22 ENCOUNTER — Ambulatory Visit: Payer: Medicare Other | Admitting: Critical Care Medicine

## 2020-03-22 NOTE — Progress Notes (Deleted)
   Subjective:    Patient ID: Paul Blair, male    DOB: 09/11/1966, 54 y.o.   MRN: [id number]  HPI    Review of Systems     Objective:   Physical Exam        Assessment & Plan:   

## 2020-03-25 ENCOUNTER — Encounter (HOSPITAL_COMMUNITY): Payer: Self-pay

## 2020-03-25 ENCOUNTER — Emergency Department (HOSPITAL_COMMUNITY)
Admission: EM | Admit: 2020-03-25 | Discharge: 2020-03-25 | Disposition: A | Discharge status: Home / Self Care | Payer: Medicare Other | Attending: Emergency Medicine | Admitting: Emergency Medicine

## 2020-03-25 ENCOUNTER — Other Ambulatory Visit: Payer: Self-pay

## 2020-03-25 DIAGNOSIS — M79604 Pain in right leg: Secondary | ICD-10-CM | POA: Diagnosis present

## 2020-03-25 DIAGNOSIS — I10 Essential (primary) hypertension: Secondary | ICD-10-CM | POA: Diagnosis not present

## 2020-03-25 DIAGNOSIS — F1290 Cannabis use, unspecified, uncomplicated: Secondary | ICD-10-CM | POA: Diagnosis not present

## 2020-03-25 DIAGNOSIS — F17200 Nicotine dependence, unspecified, uncomplicated: Secondary | ICD-10-CM | POA: Insufficient documentation

## 2020-03-25 DIAGNOSIS — Z79899 Other long term (current) drug therapy: Secondary | ICD-10-CM | POA: Diagnosis not present

## 2020-03-25 DIAGNOSIS — M5431 Sciatica, right side: Secondary | ICD-10-CM | POA: Insufficient documentation

## 2020-03-25 MED ORDER — DEXAMETHASONE 4 MG PO TABS
12.0000 mg | ORAL_TABLET | Freq: Once | ORAL | Status: AC
  Administered 2020-03-25: 12 mg via ORAL
  Filled 2020-03-25: qty 3

## 2020-03-25 MED ORDER — KETOROLAC TROMETHAMINE 15 MG/ML IJ SOLN
15.0000 mg | Freq: Once | INTRAMUSCULAR | Status: AC
  Administered 2020-03-25: 15 mg via INTRAMUSCULAR
  Filled 2020-03-25: qty 1

## 2020-03-25 MED ORDER — DEXAMETHASONE 4 MG PO TABS: 4.0000 mg | ORAL_TABLET | Freq: Two times a day (BID) | ORAL | 0 refills | Status: DC

## 2020-03-25 MED ORDER — HYDROCODONE-ACETAMINOPHEN 5-325 MG PO TABS
1.0000 | ORAL_TABLET | Freq: Once | ORAL | Status: AC
  Administered 2020-03-25: 1 via ORAL
  Filled 2020-03-25: qty 1

## 2020-03-25 MED ORDER — GABAPENTIN 300 MG PO CAPS
300.0000 mg | ORAL_CAPSULE | Freq: Once | ORAL | Status: AC
  Administered 2020-03-25: 300 mg via ORAL
  Filled 2020-03-25: qty 1

## 2020-03-25 MED ORDER — NAPROXEN 375 MG PO TABS: 375.0000 mg | ORAL_TABLET | Freq: Two times a day (BID) | ORAL | 0 refills | Status: DC

## 2020-03-25 NOTE — ED Triage Notes (Signed)
Pt BIB [facility] from homeless shelter. He has a hx of sciatica. He states that he has had to sleep in a chair for the last 2 days and it has aggravated it. Pain shoots down his R leg. Ambulatory.

## 2020-03-28 ENCOUNTER — Encounter: Payer: Self-pay | Admitting: Critical Care Medicine

## 2020-04-01 NOTE — ED Provider Notes (Signed)
[facility]-EMERGENCY DEPT Provider Note   CSN: [id number] Arrival date & time: 03/25/20  0033     History Chief Complaint  Patient presents with  . Leg Pain    R    Paul Blair is a 54 y.o. male.  HPI   54 year old male with right leg pain.  History of sciatica.  Feels like he is having exacerbation.  Denies any acute trauma or strain.  Pain is worse with movement.  Radiates from his right buttock down into his foot.  No rash.  No swelling.  Past Medical History:  Diagnosis Date  . Cocaine abuse (HCC)   . Depression   . History of ETOH abuse   . Homicidal ideations   . Hypertension   . Liver cirrhosis (HCC)   . Substance induced mood disorder (HCC)   . Suicidal ideations     Patient Active Problem List   Diagnosis Date Noted  . Cocaine abuse with cocaine-induced mood disorder (HCC) 08/20/2019  . Major depressive disorder, single episode, severe (HCC) 08/06/2019  . Major depressive disorder, recurrent episode, severe (HCC) 08/06/2019  . MDD (major depressive disorder), recurrent, severe, with psychosis (HCC) 08/05/2019  . Alcohol dependence with uncomplicated withdrawal (HCC) 11/06/2014  . Substance induced mood disorder (HCC) 11/06/2014  . Alcohol intoxication (HCC)   . Depression with suicidal ideation 11/04/2014    History reviewed. No pertinent surgical history.     History reviewed. No pertinent family history.  Social History   Tobacco Use  . Smoking status: Current Every Day Smoker  . Smokeless tobacco: Never Used  Substance Use Topics  . Alcohol use: Yes    Comment: hx abuse- incarcerated at present  . Drug use: Yes    Types: Cocaine, Marijuana    Home Medications Prior to Admission medications   Medication Sig Start Date End Date Taking? Authorizing Provider  albuterol (VENTOLIN HFA) 108 (90 Base) MCG/ACT inhaler Inhale 1-2 puffs into the lungs every 4 (four) hours as needed for shortness of breath. 11/05/19   [provider]  amLODipine (NORVASC) 10 MG tablet Take 1 tablet (10 mg total) by mouth daily. 02/21/20 08/19/20  Storm Frisk, MD  cholecalciferol (VITAMIN D3) 25 MCG (1000 UNIT) tablet Take 1 tablet (1,000 Units total) by mouth daily. 01/25/20   Storm Frisk, MD  clotrimazole (CLOTRIMAZOLE ATHLETES FOOT) 1 % cream Apply 1 application topically 2 (two) times daily. 02/15/20   Storm Frisk, MD  dexamethasone (DECADRON) 4 MG tablet Take 1 tablet (4 mg total) by mouth 2 (two) times daily. 03/25/20   Raeford Razor, MD  DULoxetine (CYMBALTA) 20 MG capsule Take 1 capsule (20 mg total) by mouth daily. For depression 01/25/20   Storm Frisk, MD  finasteride (PROSCAR) 5 MG tablet Take 1 tablet (5 mg total) by mouth daily. 01/25/20   Storm Frisk, MD  fluticasone (FLONASE) 50 MCG/ACT nasal spray Place 1 spray into both nostrils daily as needed for allergies.  10/23/15 07/18/20  [provider]  gabapentin (NEURONTIN) 100 MG capsule Take 1 capsule (100 mg total) by mouth 3 (three) times daily. For agitation 01/06/20   Armandina Stammer I, NP  hydrOXYzine (ATARAX/VISTARIL) 25 MG tablet Take 1 tablet (25 mg total) by mouth every 6 (six) hours as needed. For anxiety 01/06/20   Armandina Stammer I, NP  naproxen (NAPROSYN) 375 MG tablet Take 1 tablet (375 mg total) by mouth 2 (two) times daily with a meal. 03/25/20   Kaylanni Ezelle,  Jeannett Senior, MD  pantoprazole (PROTONIX) 40 MG tablet Take 1 tablet (40 mg total) by mouth daily. Patient not taking: Reported on 01/03/2020 08/14/19   Aldean Baker, NP  potassium chloride SA (KLOR-CON) 20 MEQ tablet Take 1 tablet (20 mEq total) by mouth daily. 01/25/20   Storm Frisk, MD  QUEtiapine (SEROQUEL) 50 MG tablet Take 1 tablet (50 mg total) by mouth at bedtime. For mood control 01/06/20   Armandina Stammer I, NP  tamsulosin (FLOMAX) 0.4 MG CAPS capsule Take 1 capsule (0.4 mg total) by mouth daily. 01/25/20   Storm Frisk, MD  traZODone (DESYREL) 50 MG tablet Take 1 tablet (50  mg total) by mouth at bedtime as needed for sleep. 01/06/20   Armandina Stammer I, NP  vitamin B-12 (CYANOCOBALAMIN) 1000 MCG tablet Take 1,000 mcg by mouth daily.    [provider]    Allergies    Patient has no known allergies.  Review of Systems   Review of Systems All systems reviewed and negative, other than as noted in HPI.  Physical Exam Updated Vital Signs BP (!) 149/89 (BP Location: Left Arm)   Pulse 67   Temp 98.4 F (36.9 C) (Oral)   Resp 18   SpO2 100%   Physical Exam Vitals and nursing note reviewed.  Constitutional:      General: He is not in acute distress.    Appearance: He is well-developed.  HENT:     Head: Normocephalic and atraumatic.  Eyes:     General:        Right eye: No discharge.        Left eye: No discharge.     Conjunctiva/sclera: Conjunctivae normal.  Cardiovascular:     Rate and Rhythm: Normal rate and regular rhythm.     Heart sounds: Normal heart sounds. No murmur heard.  No friction rub. No gallop.   Pulmonary:     Effort: Pulmonary effort is normal. No respiratory distress.     Breath sounds: Normal breath sounds.  Abdominal:     General: There is no distension.     Palpations: Abdomen is soft.     Tenderness: There is no abdominal tenderness.  Musculoskeletal:        General: No tenderness.     Cervical back: Neck supple.     Comments: Pain is nonreproducible palpation.  No concerning skin lesions or swelling noted.  Strength is 5 out of 5 bilateral lower extremities.  Sensation intact to light touch.  Palpable DP pulses.  Skin:    General: Skin is warm and dry.  Neurological:     Mental Status: He is alert.  Psychiatric:        Behavior: Behavior normal.        Thought Content: Thought content normal.     ED Results / Procedures / Treatments   Labs (all labs ordered are listed, but only abnormal results are displayed) Labs Reviewed - No data to display  EKG None  Radiology No results  found.  Procedures Procedures (including critical care time)  Medications Ordered in ED Medications  ketorolac (TORADOL) 15 MG/ML injection 15 mg (15 mg Intramuscular Given 03/25/20 0850)  dexamethasone (DECADRON) tablet 12 mg (12 mg Oral Given 03/25/20 0849)  HYDROcodone-acetaminophen (NORCO/VICODIN) 5-325 MG per tablet 1 tablet (1 tablet Oral Given 03/25/20 0849)  gabapentin (NEURONTIN) capsule 300 mg (300 mg Oral Given 03/25/20 0849)    ED Course  I have reviewed the triage vital signs and the  nursing notes.  Pertinent labs & imaging results that were available during my care of the patient were reviewed by me and considered in my medical decision making (see chart for details).    MDM Rules/Calculators/A&P                         54 year old male with symptoms consistent with sciatica.  No "red flags."  Symptomatic treatment.  Return precautions discussed.    Final Clinical Impression(s) / ED Diagnoses Final diagnoses:  Sciatica of right side    Rx / DC Orders ED Discharge Orders         Ordered    dexamethasone (DECADRON) 4 MG tablet  2 times daily     Discontinue  Reprint     03/25/20 0754    naproxen (NAPROSYN) 375 MG tablet  2 times daily with meals     Discontinue  Reprint     03/25/20 0754           Raeford Razor, MD 04/01/20 1223

## 2020-04-04 ENCOUNTER — Other Ambulatory Visit: Payer: Self-pay | Admitting: Critical Care Medicine

## 2020-04-04 MED ORDER — TAMSULOSIN HCL 0.4 MG PO CAPS: 0.4000 mg | ORAL_CAPSULE | Freq: Every day | ORAL | 3 refills | Status: DC

## 2020-04-04 MED ORDER — AMLODIPINE BESYLATE 10 MG PO TABS: 10.0000 mg | ORAL_TABLET | Freq: Every day | ORAL | 1 refills | Status: DC

## 2020-04-04 MED ORDER — DULOXETINE HCL 20 MG PO CPEP: 20.0000 mg | ORAL_CAPSULE | Freq: Every day | ORAL | 1 refills | Status: DC

## 2020-04-04 MED ORDER — FINASTERIDE 5 MG PO TABS: 5.0000 mg | ORAL_TABLET | Freq: Every day | ORAL | 3 refills | Status: DC

## 2020-04-04 MED ORDER — OLOPATADINE HCL 0.1 % OP SOLN: 1.0000 [drp] | Freq: Two times a day (BID) | OPHTHALMIC | 12 refills | Status: DC

## 2020-04-04 MED ORDER — QUETIAPINE FUMARATE 50 MG PO TABS: 50.0000 mg | ORAL_TABLET | Freq: Every day | ORAL | 0 refills | Status: DC

## 2020-04-04 MED ORDER — CLOTRIMAZOLE 1 % EX CREA: 1.0000 "application " | TOPICAL_CREAM | Freq: Two times a day (BID) | CUTANEOUS | 0 refills | Status: DC

## 2020-04-04 NOTE — Progress Notes (Signed)
Medication refills

## 2020-04-05 DIAGNOSIS — M5430 Sciatica, unspecified side: Secondary | ICD-10-CM | POA: Insufficient documentation

## 2020-04-05 NOTE — Progress Notes (Signed)
Patient ID: Paul Blair, male   DOB: 07/05/1966, 54 y.o.   MRN: [id number]  Pt needs refills on BPH meds and HTN meds  Needs f/u in [facility]  I have sent refills and have appt for this pt

## 2020-04-11 ENCOUNTER — Telehealth: Payer: Self-pay

## 2020-04-11 NOTE — ED Triage Notes (Addendum)
Pt presents to ED BIB [facility]. Pt c/o of generalized chest tightness that is nonradiating. Pt reports he was walking when it began. And he has been smoking a lot of cigarettes. Hx - asthma  EMS VS:  140/88  hr - 91 98%

## 2020-04-11 NOTE — Telephone Encounter (Signed)
At request of Dr Delford Field, call placed to patient # [phone] regarding ACT team. Message left with call back requested to this CM # [phone]

## 2020-04-12 ENCOUNTER — Other Ambulatory Visit: Payer: Self-pay

## 2020-04-12 ENCOUNTER — Encounter (HOSPITAL_COMMUNITY): Payer: Self-pay | Admitting: Emergency Medicine

## 2020-04-12 ENCOUNTER — Emergency Department (HOSPITAL_COMMUNITY)
Admission: EM | Admit: 2020-04-12 | Discharge: 2020-04-12 | Disposition: A | Discharge status: Home / Self Care | Payer: Medicare Other | Attending: Emergency Medicine | Admitting: Emergency Medicine

## 2020-04-12 ENCOUNTER — Ambulatory Visit: Payer: Medicare Other

## 2020-04-12 ENCOUNTER — Emergency Department (HOSPITAL_COMMUNITY): Payer: Medicare Other

## 2020-04-12 DIAGNOSIS — Z20822 Contact with and (suspected) exposure to covid-19: Secondary | ICD-10-CM | POA: Insufficient documentation

## 2020-04-12 DIAGNOSIS — J45909 Unspecified asthma, uncomplicated: Secondary | ICD-10-CM | POA: Diagnosis not present

## 2020-04-12 DIAGNOSIS — Z7951 Long term (current) use of inhaled steroids: Secondary | ICD-10-CM | POA: Diagnosis not present

## 2020-04-12 DIAGNOSIS — R0602 Shortness of breath: Secondary | ICD-10-CM | POA: Diagnosis present

## 2020-04-12 DIAGNOSIS — F17200 Nicotine dependence, unspecified, uncomplicated: Secondary | ICD-10-CM | POA: Diagnosis not present

## 2020-04-12 DIAGNOSIS — R079 Chest pain, unspecified: Secondary | ICD-10-CM | POA: Insufficient documentation

## 2020-04-12 DIAGNOSIS — Z79899 Other long term (current) drug therapy: Secondary | ICD-10-CM | POA: Insufficient documentation

## 2020-04-12 DIAGNOSIS — I10 Essential (primary) hypertension: Secondary | ICD-10-CM | POA: Diagnosis not present

## 2020-04-12 LAB — CBC
HCT: 49.8 % (ref 39.0–52.0)
Hemoglobin: 17.3 g/dL — ABNORMAL HIGH (ref 13.0–17.0)
MCH: 32.4 pg (ref 26.0–34.0)
MCHC: 34.7 g/dL (ref 30.0–36.0)
MCV: 93.3 fL (ref 80.0–100.0)
Platelets: 266 10*3/uL (ref 150–400)
RBC: 5.34 MIL/uL (ref 4.22–5.81)
RDW: 14.7 % (ref 11.5–15.5)
WBC: 7.3 10*3/uL (ref 4.0–10.5)
nRBC: 0 % (ref 0.0–0.2)

## 2020-04-12 LAB — BASIC METABOLIC PANEL
Anion gap: 13 (ref 5–15)
BUN: 6 mg/dL (ref 6–20)
CO2: 23 mmol/L (ref 22–32)
Calcium: 8.2 mg/dL — ABNORMAL LOW (ref 8.9–10.3)
Chloride: 104 mmol/L (ref 98–111)
Creatinine, Ser: 1.24 mg/dL (ref 0.61–1.24)
GFR calc Af Amer: >60 mL/min (ref >60)
GFR calc non Af Amer: >60 mL/min (ref >60)
Glucose, Bld: 100 mg/dL — ABNORMAL HIGH (ref 70–99)
Potassium: 2.9 mmol/L — ABNORMAL LOW (ref 3.5–5.1)
Sodium: 140 mmol/L (ref 135–145)

## 2020-04-12 LAB — TROPONIN I (HIGH SENSITIVITY)
Troponin I (High Sensitivity): 13 ng/L (ref <18)
Troponin I (High Sensitivity): 11 ng/L (ref <18)

## 2020-04-12 LAB — SARS CORONAVIRUS 2 BY RT PCR (HOSPITAL ORDER, PERFORMED IN ~~LOC~~ HOSPITAL LAB): SARS Coronavirus 2: NEGATIVE

## 2020-04-12 MED ORDER — SODIUM CHLORIDE 0.9% FLUSH: 3.0000 mL | Freq: Once | INTRAVENOUS | Status: DC

## 2020-04-12 MED ORDER — PREDNISONE 20 MG PO TABS
40.0000 mg | ORAL_TABLET | Freq: Every day | ORAL | 0 refills | Status: DC
Start: 2020-04-13 — End: 2020-08-14

## 2020-04-12 MED ORDER — PREDNISONE 20 MG PO TABS
40.0000 mg | ORAL_TABLET | Freq: Once | ORAL | Status: AC
  Administered 2020-04-12: 40 mg via ORAL
  Filled 2020-04-12: qty 2

## 2020-04-12 MED ORDER — ALBUTEROL SULFATE HFA 108 (90 BASE) MCG/ACT IN AERS
2.0000 | INHALATION_SPRAY | RESPIRATORY_TRACT | Status: DC | PRN
  Administered 2020-04-12: 2 via RESPIRATORY_TRACT
  Filled 2020-04-12: qty 6.7

## 2020-04-12 NOTE — Telephone Encounter (Signed)
Call returned to the patient   He explained that he was in the ED this morning for his asthma.  He said he has no medications except the albuterol inhaler.  It is stated in the provider's note from ED that he has a nebulizer at home but he said he doesn't have one and could use one as he is trying to better manage his asthma.  He doesn't have any medication ordered for the nebulizer. He has no transportation and is anxious to get his medications straightened out.  He said that he has not gone to [facility] to pick some of them up because he has no transportation.  He used the bus passes that he had.  He was interested in delivery from [facility] [employer] if they are in network with his insurance.  His appointment with Dr Delford Field is 04/24/2020; but he wanted to be seen before then.  Explained to him that he could go to the mobile medical unit at the  [facility] this afternoon.  The unit is there until 1900 today and he can walk there. Marland Kitchen  He said he was on his way.  He also said that he has an appointment 04/17/2020 with [facility] to complete paperwork for an ACTT.

## 2020-04-12 NOTE — ED Notes (Signed)
Pt d/c home pr MD order. Discharge summary reviewed, pt verbalizes understanding,. Ambulatory off unit. No s/s of acute distress noted.

## 2020-04-12 NOTE — Telephone Encounter (Signed)
Pt is returning jane call 

## 2020-04-12 NOTE — ED Provider Notes (Signed)
[facility] EMERGENCY DEPARTMENT Provider Note   CSN: [id number] Arrival date & time: 04/12/20  0012     History Chief Complaint  Patient presents with  . Chest Pain    Paul Blair is a 54 y.o. male.  HPI Patient presents with shortness of breath and chest tightness.  States began last night while he was walking.  States he has had a little bit of cough.  States has been smoking a lot of cigarettes.  History of asthma and states this feels like that.  No fevers.  Has not had his Covid vaccines.  Feeling better now.  Had a long wait in the waiting room for being able to get back to see me.  States he has an inhaler and uses nebulizer at home.    Past Medical History:  Diagnosis Date  . Cocaine abuse (HCC)   . Depression   . History of ETOH abuse   . Homicidal ideations   . Hypertension   . Liver cirrhosis (HCC)   . Substance induced mood disorder (HCC)   . Suicidal ideations     Patient Active Problem List   Diagnosis Date Noted  . Cocaine abuse with cocaine-induced mood disorder (HCC) 08/20/2019  . Major depressive disorder, single episode, severe (HCC) 08/06/2019  . Major depressive disorder, recurrent episode, severe (HCC) 08/06/2019  . MDD (major depressive disorder), recurrent, severe, with psychosis (HCC) 08/05/2019  . Alcohol dependence with uncomplicated withdrawal (HCC) 11/06/2014  . Substance induced mood disorder (HCC) 11/06/2014  . Alcohol intoxication (HCC)   . Depression with suicidal ideation 11/04/2014    History reviewed. No pertinent surgical history.     History reviewed. No pertinent family history.  Social History   Tobacco Use  . Smoking status: Current Every Day Smoker  . Smokeless tobacco: Never Used  Substance Use Topics  . Alcohol use: Yes    Comment: hx abuse- incarcerated at present  . Drug use: Yes    Types: Cocaine, Marijuana    Home Medications Prior to Admission medications   Medication Sig Start Date End  Date Taking? Authorizing Provider  albuterol (VENTOLIN HFA) 108 (90 Base) MCG/ACT inhaler Inhale 1-2 puffs into the lungs every 4 (four) hours as needed for shortness of breath. 11/05/19   [provider]  amLODipine (NORVASC) 10 MG tablet Take 1 tablet (10 mg total) by mouth daily. 04/04/20 10/01/20  Storm Frisk, MD  cholecalciferol (VITAMIN D3) 25 MCG (1000 UNIT) tablet Take 1 tablet (1,000 Units total) by mouth daily. 01/25/20   Storm Frisk, MD  clotrimazole (CLOTRIMAZOLE ATHLETES FOOT) 1 % cream Apply 1 application topically 2 (two) times daily. 04/04/20   Storm Frisk, MD  diclofenac (VOLTAREN) 75 MG EC tablet Take 75 mg by mouth 2 (two) times daily. 04/05/20   [provider]  DULoxetine (CYMBALTA) 20 MG capsule Take 1 capsule (20 mg total) by mouth daily. For depression 04/04/20   Storm Frisk, MD  finasteride (PROSCAR) 5 MG tablet Take 1 tablet (5 mg total) by mouth daily. 04/04/20   Storm Frisk, MD  fluticasone (FLONASE) 50 MCG/ACT nasal spray Place 1 spray into both nostrils daily as needed for allergies.  10/23/15 07/18/20  [provider]  gabapentin (NEURONTIN) 100 MG capsule Take 1 capsule (100 mg total) by mouth 3 (three) times daily. For agitation 01/06/20   Armandina Stammer I, NP  hydrOXYzine (ATARAX/VISTARIL) 25 MG tablet Take 1 tablet (25 mg total) by mouth every  6 (six) hours as needed. For anxiety 01/06/20   Armandina Stammer I, NP  ketoconazole (NIZORAL) 2 % shampoo Apply 1 application topically 2 (two) times a week. 04/05/20   [provider]  naproxen (NAPROSYN) 375 MG tablet Take 1 tablet (375 mg total) by mouth 2 (two) times daily with a meal. 03/25/20   Raeford Razor, MD  nystatin ointment (MYCOSTATIN) Apply topically. 04/07/20   [provider]  olopatadine (PATANOL) 0.1 % ophthalmic solution Place 1 drop into both eyes 2 (two) times daily. 04/04/20   Storm Frisk, MD  pantoprazole (PROTONIX) 40 MG tablet Take 1 tablet  (40 mg total) by mouth daily. Patient not taking: Reported on 01/03/2020 08/14/19   Aldean Baker, NP  potassium chloride SA (KLOR-CON) 20 MEQ tablet Take 1 tablet (20 mEq total) by mouth daily. 01/25/20   Storm Frisk, MD  predniSONE (DELTASONE) 20 MG tablet Take 2 tablets (40 mg total) by mouth daily. 04/13/20   Benjiman Core, MD  QUEtiapine (SEROQUEL) 50 MG tablet Take 1 tablet (50 mg total) by mouth at bedtime. For mood control 04/04/20   Storm Frisk, MD  tamsulosin (FLOMAX) 0.4 MG CAPS capsule Take 1 capsule (0.4 mg total) by mouth daily. 04/04/20   Storm Frisk, MD  traZODone (DESYREL) 50 MG tablet Take 1 tablet (50 mg total) by mouth at bedtime as needed for sleep. 01/06/20   Armandina Stammer I, NP  vitamin B-12 (CYANOCOBALAMIN) 1000 MCG tablet Take 1,000 mcg by mouth daily.    [provider]    Allergies    Tomato  Review of Systems   Review of Systems  Constitutional: Negative for appetite change.  HENT: Negative for congestion.   Respiratory: Positive for cough and shortness of breath.   Cardiovascular: Positive for chest pain. Negative for leg swelling.  Gastrointestinal: Negative for abdominal pain.  Genitourinary: Negative for flank pain.  Neurological: Negative for weakness.  Psychiatric/Behavioral: Negative for confusion.    Physical Exam Updated Vital Signs BP (!) 137/80   Pulse 62   Temp 98.7 F (37.1 C) (Oral)   Resp 16   SpO2 98%   Physical Exam Vitals and nursing note reviewed.  HENT:     Head: Normocephalic.  Eyes:     Extraocular Movements: Extraocular movements intact.  Pulmonary:     Breath sounds: Wheezing present.     Comments: Mild wheezes without respiratory distress. Abdominal:     Palpations: There is no hepatomegaly or splenomegaly.  Musculoskeletal:     Right lower leg: No edema.     Left lower leg: No edema.  Skin:    General: Skin is warm.     Capillary Refill: Capillary refill takes less than 2 seconds.    Neurological:     Mental Status: He is alert.     ED Results / Procedures / Treatments   Labs (all labs ordered are listed, but only abnormal results are displayed) Labs Reviewed  BASIC METABOLIC PANEL - Abnormal; Notable for the following components:      Result Value   Potassium 2.9 (*)    Glucose, Bld 100 (*)    Calcium 8.2 (*)    All other components within normal limits  CBC - Abnormal; Notable for the following components:   Hemoglobin 17.3 (*)    All other components within normal limits  SARS CORONAVIRUS 2 BY RT PCR (HOSPITAL ORDER, PERFORMED IN [facility])  TROPONIN I (HIGH SENSITIVITY)  TROPONIN  I (HIGH SENSITIVITY)    EKG EKG Interpretation  Date/Time:  Thursday April 12 2020 00:57:58 EDT Ventricular Rate:  78 PR Interval:  152 QRS Duration: 90 QT Interval:  412 QTC Calculation: 469 R Axis:   11 Text Interpretation: Normal sinus rhythm Minimal voltage criteria for LVH, may be normal variant ( R in aVL ) T wave abnormality, consider inferolateral ischemia Prolonged QT Abnormal ECG When compared with ECG of 08/08/2019, QT has shortened Confirmed by Dione Booze [id number]) on 04/12/2020 2:01:35 AM Also confirmed by Benjiman Core [phone])  on 04/12/2020 8:38:03 AM   Radiology DG Chest 2 View  Result Date: 04/12/2020 CLINICAL DATA:  Chest pain EXAM: CHEST - 2 VIEW COMPARISON:  11/11/2019 FINDINGS: Cardiac shadow is within normal limits. The lungs are clear bilaterally. No focal infiltrate or sizable effusion is noted. No bony abnormality is seen. IMPRESSION: No active cardiopulmonary disease. Electronically Signed   By: Alcide Clever M.D.   On: 04/12/2020 01:16    Procedures Procedures (including critical care time)  Medications Ordered in ED Medications  sodium chloride flush (NS) 0.9 % injection 3 mL (has no administration in time range)  albuterol (VENTOLIN HFA) 108 (90 Base) MCG/ACT inhaler 2 puff (2 puffs Inhalation Given 04/12/20 0902)   predniSONE (DELTASONE) tablet 40 mg (40 mg Oral Given 04/12/20 0902)    ED Course  I have reviewed the triage vital signs and the nursing notes.  Pertinent labs & imaging results that were available during my care of the patient were reviewed by me and considered in my medical decision making (see chart for details).    MDM Rules/Calculators/A&P                          Patient with shortness of breath and cough.  Began last night.  History of asthma.  Had reportedly been smoking a lot of cigarettes too.  No fevers.  Mild chest tightness.  Has not had his Covid vaccines.  No known Covid contacts.  Waited around 9 hours in the waiting room to see me.  Feeling somewhat better now.  Well-appearing.  X-rays reassuring.  No focal pneumonia on auscultation.  Will discharge home with pulmonary follow-up as needed.  Covid testing done but not resulted. Final Clinical Impression(s) / ED Diagnoses Final diagnoses:  Uncomplicated asthma, unspecified asthma severity, unspecified whether persistent    Rx / DC Orders ED Discharge Orders         Ordered    predniSONE (DELTASONE) 20 MG tablet  Daily     Discontinue  Reprint     04/12/20 0945           Benjiman Core, MD 04/12/20 [phone]

## 2020-04-12 NOTE — Telephone Encounter (Signed)
MMU are on the lookout for the patient to present to clinic.

## 2020-04-15 NOTE — Telephone Encounter (Signed)
Please contact this pt and tell him to come to the mobile clinic tomorrow at [facility]   I can see him there

## 2020-04-16 ENCOUNTER — Ambulatory Visit: Payer: Medicare Other

## 2020-04-16 NOTE — Telephone Encounter (Signed)
Please schedule patient for visit on today with [facility]

## 2020-04-19 ENCOUNTER — Ambulatory Visit: Payer: Medicare Other

## 2020-04-24 ENCOUNTER — Ambulatory Visit: Payer: Medicare Other | Admitting: Critical Care Medicine

## 2020-04-24 NOTE — Progress Notes (Deleted)
   Subjective:    Patient ID: Paul Blair, male    DOB: 1966/01/05, 54 y.o.   MRN: [id number]  HPI    Review of Systems     Objective:   Physical Exam        Assessment & Plan:

## 2020-08-14 ENCOUNTER — Encounter (HOSPITAL_COMMUNITY): Payer: Self-pay | Admitting: Emergency Medicine

## 2020-08-14 ENCOUNTER — Ambulatory Visit (HOSPITAL_COMMUNITY)
Admission: EM | Admit: 2020-08-14 | Discharge: 2020-08-14 | Disposition: A | Discharge status: Home / Self Care | Payer: Medicare Other | Attending: Family Medicine | Admitting: Family Medicine

## 2020-08-14 ENCOUNTER — Other Ambulatory Visit: Payer: Self-pay

## 2020-08-14 DIAGNOSIS — M25571 Pain in right ankle and joints of right foot: Secondary | ICD-10-CM | POA: Diagnosis not present

## 2020-08-14 DIAGNOSIS — M5431 Sciatica, right side: Secondary | ICD-10-CM

## 2020-08-14 MED ORDER — KETOROLAC TROMETHAMINE 30 MG/ML IJ SOLN
INTRAMUSCULAR | Status: AC
  Filled 2020-08-14: qty 1

## 2020-08-14 MED ORDER — GABAPENTIN 300 MG PO CAPS
300.0000 mg | ORAL_CAPSULE | Freq: Three times a day (TID) | ORAL | 1 refills | Status: DC
Start: 2020-08-14 — End: 2020-08-17

## 2020-08-14 MED ORDER — PREDNISONE 10 MG (21) PO TBPK
ORAL_TABLET | ORAL | 0 refills | Status: DC
Start: 2020-08-14 — End: 2020-08-20

## 2020-08-14 MED ORDER — KETOROLAC TROMETHAMINE 30 MG/ML IJ SOLN
30.0000 mg | Freq: Once | INTRAMUSCULAR | Status: AC
  Administered 2020-08-14: 30 mg via INTRAMUSCULAR

## 2020-08-14 NOTE — ED Provider Notes (Signed)
[facility]    CSN: [id number] Arrival date & time: 08/14/20  1101      History   Chief Complaint Chief Complaint  Patient presents with  . Foot Pain  . Back Pain    HPI Paul Blair is a 54 y.o. male.   Patient is a 54 year old male with past medical history of cocaine abuse, depression, EtOH abuse, homicidal ideations, hypertension, liver cirrhosis, substance induced mood disorder, suicidal ideations, hypertension, sciatic nerve pain.  He presents today with flareup of chronic right-sided sciatic nerve pain.  Last time was seen for this approximate 6 months ago in the ER.  Pain in right back area with radiation down the right leg.  There is associated numbness and tingling.  Denies any weakness, loss of bowel or bladder function.  No new falls or injuries.  Has been taking gabapentin which does not  seem to help much but he is out of that. Has appointment scheduled with his primary care doctor.      Past Medical History:  Diagnosis Date  . Cocaine abuse (HCC)   . Depression   . History of ETOH abuse   . Homicidal ideations   . Hypertension   . Liver cirrhosis (HCC)   . Substance induced mood disorder (HCC)   . Suicidal ideations     Patient Active Problem List   Diagnosis Date Noted  . Cocaine abuse with cocaine-induced mood disorder (HCC) 08/20/2019  . Major depressive disorder, single episode, severe (HCC) 08/06/2019  . Major depressive disorder, recurrent episode, severe (HCC) 08/06/2019  . MDD (major depressive disorder), recurrent, severe, with psychosis (HCC) 08/05/2019  . Alcohol dependence with uncomplicated withdrawal (HCC) 11/06/2014  . Substance induced mood disorder (HCC) 11/06/2014  . Alcohol intoxication (HCC)   . Depression with suicidal ideation 11/04/2014    History reviewed. No pertinent surgical history.     Home Medications    Prior to Admission medications   Medication Sig Start Date End Date Taking? Authorizing Provider    albuterol (VENTOLIN HFA) 108 (90 Base) MCG/ACT inhaler Inhale 1-2 puffs into the lungs every 4 (four) hours as needed for shortness of breath. 11/05/19   [provider]  amLODipine (NORVASC) 10 MG tablet Take 1 tablet (10 mg total) by mouth daily. 04/04/20 10/01/20  Storm Frisk, MD  cholecalciferol (VITAMIN D3) 25 MCG (1000 UNIT) tablet Take 1 tablet (1,000 Units total) by mouth daily. 01/25/20   Storm Frisk, MD  clotrimazole (CLOTRIMAZOLE ATHLETES FOOT) 1 % cream Apply 1 application topically 2 (two) times daily. 04/04/20   Storm Frisk, MD  diclofenac (VOLTAREN) 75 MG EC tablet Take 75 mg by mouth 2 (two) times daily. 04/05/20   [provider]  DULoxetine (CYMBALTA) 20 MG capsule Take 1 capsule (20 mg total) by mouth daily. For depression 04/04/20   Storm Frisk, MD  finasteride (PROSCAR) 5 MG tablet Take 1 tablet (5 mg total) by mouth daily. 04/04/20   Storm Frisk, MD  fluticasone (FLONASE) 50 MCG/ACT nasal spray Place 1 spray into both nostrils daily as needed for allergies.  10/23/15 07/18/20  [provider]  gabapentin (NEURONTIN) 300 MG capsule Take 1 capsule (300 mg total) by mouth 3 (three) times daily. 08/14/20   Dahlia Byes A, NP  hydrOXYzine (ATARAX/VISTARIL) 25 MG tablet Take 1 tablet (25 mg total) by mouth every 6 (six) hours as needed. For anxiety 01/06/20   Armandina Stammer I, NP  ketoconazole (NIZORAL) 2 % shampoo Apply  1 application topically 2 (two) times a week. 04/05/20   [provider]  nystatin ointment (MYCOSTATIN) Apply topically. 04/07/20   [provider]  olopatadine (PATANOL) 0.1 % ophthalmic solution Place 1 drop into both eyes 2 (two) times daily. 04/04/20   Storm Frisk, MD  pantoprazole (PROTONIX) 40 MG tablet Take 1 tablet (40 mg total) by mouth daily. 08/14/19   Aldean Baker, NP  potassium chloride SA (KLOR-CON) 20 MEQ tablet Take 1 tablet (20 mEq total) by mouth daily. 01/25/20   Storm Frisk, MD   predniSONE (STERAPRED UNI-PAK 21 TAB) 10 MG (21) TBPK tablet 6 tabs for 1 day, then 5 tabs for 1 das, then 4 tabs for 1 day, then 3 tabs for 1 day, 2 tabs for 1 day, then 1 tab for 1 day 08/14/20   Dahlia Byes A, NP  QUEtiapine (SEROQUEL) 50 MG tablet Take 1 tablet (50 mg total) by mouth at bedtime. For mood control 04/04/20   Storm Frisk, MD  tamsulosin (FLOMAX) 0.4 MG CAPS capsule Take 1 capsule (0.4 mg total) by mouth daily. 04/04/20   Storm Frisk, MD  traZODone (DESYREL) 50 MG tablet Take 1 tablet (50 mg total) by mouth at bedtime as needed for sleep. 01/06/20   Armandina Stammer I, NP  vitamin B-12 (CYANOCOBALAMIN) 1000 MCG tablet Take 1,000 mcg by mouth daily.    [provider]    Family History History reviewed. No pertinent family history.  Social History Social History   Tobacco Use  . Smoking status: Current Every Day Smoker  . Smokeless tobacco: Never Used  Substance Use Topics  . Alcohol use: Yes    Comment: hx abuse- incarcerated at present  . Drug use: Yes    Types: Cocaine, Marijuana     Allergies   Tomato   Review of Systems Review of Systems   Physical Exam Triage Vital Signs ED Triage Vitals  Enc Vitals Group     BP 08/14/20 1302 (!) 147/89     Pulse Rate 08/14/20 1302 71     Resp 08/14/20 1302 17     Temp 08/14/20 1302 97.7 F (36.5 C)     Temp Source 08/14/20 1302 Oral     SpO2 08/14/20 1302 100 %     Weight --      Height --      Head Circumference --      Peak Flow --      Pain Score 08/14/20 1301 8     Pain Loc --      Pain Edu? --      Excl. in GC? --    No data found.  Updated Vital Signs BP (!) 147/89 (BP Location: Right Arm)   Pulse 71   Temp 97.7 F (36.5 C) (Oral)   Resp 17   SpO2 100%   Visual Acuity Right Eye Distance:   Left Eye Distance:   Bilateral Distance:    Right Eye Near:   Left Eye Near:    Bilateral Near:     Physical Exam Vitals and nursing note reviewed.  Constitutional:       Appearance: Normal appearance.  HENT:     Head: Normocephalic and atraumatic.  Eyes:     Conjunctiva/sclera: Conjunctivae normal.  Pulmonary:     Effort: Pulmonary effort is normal.  Musculoskeletal:     Cervical back: Normal range of motion.     Lumbar back: Positive right straight leg raise test.  Back:       Feet:  Feet:     Comments: Area of tenderness  Skin:    General: Skin is warm and dry.  Neurological:     Mental Status: He is alert.  Psychiatric:        Mood and Affect: Mood normal.      UC Treatments / Results  Labs (all labs ordered are listed, but only abnormal results are displayed) Labs Reviewed - No data to display  EKG   Radiology No results found.  Procedures Procedures (including critical care time)  Medications Ordered in UC Medications  ketorolac (TORADOL) 30 MG/ML injection 30 mg (30 mg Intramuscular Given 08/14/20 1359)    Initial Impression / Assessment and Plan / UC Course  I have reviewed the triage vital signs and the nursing notes.  Pertinent labs & imaging results that were available during my care of the patient were reviewed by me and considered in my medical decision making (see chart for details).      Sciatic nerve pain This is chronic for pt. Has not had an MRI in a while. The pain is worsening. No new injuries or falls. May need repeat MRI. Will see PCP for this. Treating with Toradol here and prednisone taper. Refilling gabapentin for nerve pain.  Will send notes to PCP Reports that he has all of his chronic meds and no refills needed.  Final Clinical Impressions(s) / UC Diagnoses   Final diagnoses:  Sciatic nerve pain, right  Pain in joint involving right ankle and foot     Discharge Instructions     Placing you on a prednisone taper. Take this daily with food.  Gabapentin 3 times a day for nerve pain. I gave you one refill.  Toradol given here for pain.  Follow up with Dr. Delford Field as planned.     ED  Prescriptions    Medication Sig Dispense Auth. Provider   predniSONE (STERAPRED UNI-PAK 21 TAB) 10 MG (21) TBPK tablet 6 tabs for 1 day, then 5 tabs for 1 das, then 4 tabs for 1 day, then 3 tabs for 1 day, 2 tabs for 1 day, then 1 tab for 1 day 21 tablet Fatina Sprankle A, NP   gabapentin (NEURONTIN) 300 MG capsule Take 1 capsule (300 mg total) by mouth 3 (three) times daily. 90 capsule Airyana Sprunger A, NP     PDMP not reviewed this encounter.   Janace Aris, NP 08/14/20 1544

## 2020-08-14 NOTE — Discharge Instructions (Addendum)
Placing you on a prednisone taper. Take this daily with food.  Gabapentin 3 times a day for nerve pain. I gave you one refill.  Toradol given here for pain.  Follow up with Dr. Delford Field as planned.

## 2020-08-14 NOTE — ED Triage Notes (Signed)
Pt presents with right foot and back pain. States jumped off roof about 6 months ago and landed on piece of metal in ground. States back pain comes and goes, radiates into right leg.

## 2020-08-17 ENCOUNTER — Telehealth: Payer: Self-pay | Admitting: Critical Care Medicine

## 2020-08-17 MED ORDER — TRAMADOL HCL 50 MG PO TABS: 50.0000 mg | ORAL_TABLET | Freq: Three times a day (TID) | ORAL | 0 refills | Status: DC | PRN

## 2020-08-17 MED ORDER — MELOXICAM 15 MG PO TABS: 15.0000 mg | ORAL_TABLET | Freq: Every day | ORAL | 0 refills | Status: DC

## 2020-08-17 MED ORDER — GABAPENTIN 300 MG PO CAPS: 600.0000 mg | ORAL_CAPSULE | Freq: Three times a day (TID) | ORAL | 1 refills | Status: DC

## 2020-08-17 NOTE — Telephone Encounter (Signed)
Patient states he needs to be seen today for back & foot pain. Informed patient that we did not have anything available for today but scheduled with Cari on Monday. Patient states he went to Urgent Care two days ago and the medications they prescribed him are not helping. Patient wondered if any medication could be sent to help him over the weekend before his appointment Monday. Please advise.

## 2020-08-17 NOTE — Telephone Encounter (Signed)
Pt already has an appt today,  I spoke to the pt, will send short term tramadol Rx to his pharm and meloxicam   He may need mri of foot  He has insurance

## 2020-08-20 ENCOUNTER — Other Ambulatory Visit: Payer: Self-pay

## 2020-08-20 ENCOUNTER — Other Ambulatory Visit: Payer: Self-pay | Admitting: Physician Assistant

## 2020-08-20 ENCOUNTER — Ambulatory Visit: Payer: Medicare Other | Attending: Physician Assistant | Admitting: Physician Assistant

## 2020-08-20 VITALS — BP 131/81 | HR 84 | Temp 98.7°F | Resp 18 | Ht 73.0 in | Wt 248.0 lb

## 2020-08-20 DIAGNOSIS — M79671 Pain in right foot: Secondary | ICD-10-CM | POA: Diagnosis not present

## 2020-08-20 DIAGNOSIS — M5441 Lumbago with sciatica, right side: Secondary | ICD-10-CM

## 2020-08-20 DIAGNOSIS — G8929 Other chronic pain: Secondary | ICD-10-CM

## 2020-08-20 MED ORDER — TRAMADOL HCL 50 MG PO TABS: 50.0000 mg | ORAL_TABLET | Freq: Three times a day (TID) | ORAL | 0 refills | Status: DC | PRN

## 2020-08-20 MED ORDER — KETOROLAC TROMETHAMINE 60 MG/2ML IM SOLN
60.0000 mg | Freq: Once | INTRAMUSCULAR | Status: AC
  Administered 2020-08-20: 60 mg via INTRAMUSCULAR

## 2020-08-20 MED FILL — traMADol HCL 50 MG TABS: 50 | 6 days supply | Qty: 20 | Fill #0

## 2020-08-20 NOTE — Patient Instructions (Signed)
I resent the prescription for tramadol to your pharmacy, I hope that this will offer you some relief from your acute pain.  I have started a referral for you to be seen by orthopedics.  We will also work on starting referrals for you to obtain imaging of your back and foot.  I hope that you feel better soon, please let us know if there is anything else we can do for you  Roney Jaffe, PA-C Physician Assistant [facility] [email or web address]    Sciatica  Sciatica is pain, numbness, weakness, or tingling along the path of the sciatic nerve. The sciatic nerve starts in the lower back and runs down the back of each leg. The nerve controls the muscles in the lower leg and in the back of the knee. It also provides feeling (sensation) to the back of the thigh, the lower leg, and the sole of the foot. Sciatica is a symptom of another medical condition that pinches or puts pressure on the sciatic nerve. Sciatica most often only affects one side of the body. Sciatica usually goes away on its own or with treatment. In some cases, sciatica may come back (recur). What are the causes? This condition is caused by pressure on the sciatic nerve or pinching of the nerve. This may be the result of:  A disk in between the bones of the spine bulging out too far (herniated disk).  Age-related changes in the spinal disks.  A pain disorder that affects a muscle in the buttock.  Extra bone growth near the sciatic nerve.  A break (fracture) of the pelvis.  Pregnancy.  Tumor. This is rare. What increases the risk? The following factors may make you more likely to develop this condition:  Playing sports that place pressure or stress on the spine.  Having poor strength and flexibility.  A history of back injury or surgery.  Sitting for long periods of time.  Doing activities that involve repetitive bending or lifting.  Obesity. What are the  signs or symptoms? Symptoms can vary from mild to very severe, and they may include:  Any of these problems in the lower back, leg, hip, or buttock: ? Mild tingling, numbness, or dull aches. ? Burning sensations. ? Sharp pains.  Numbness in the back of the calf or the sole of the foot.  Leg weakness.  Severe back pain that makes movement difficult. Symptoms may get worse when you cough, sneeze, or laugh, or when you sit or stand for long periods of time. How is this diagnosed? This condition may be diagnosed based on:  Your symptoms and medical history.  A physical exam.  Blood tests.  Imaging tests, such as: ? X-rays. ? MRI. ? CT scan. How is this treated? In many cases, this condition improves on its own without treatment. However, treatment may include:  Reducing or modifying physical activity.  Exercising and stretching.  Icing and applying heat to the affected area.  Medicines that help to: ? Relieve pain and swelling. ? Relax your muscles.  Injections of medicines that help to relieve pain, irritation, and inflammation around the sciatic nerve (steroids).  Surgery. Follow these instructions at home: Medicines  Take over-the-counter and prescription medicines only as told by your health care provider.  Ask your health care provider if the medicine prescribed to you: ? Requires you to avoid driving or using heavy machinery. ? Can cause constipation. You may need to take these actions to prevent or treat constipation:  Drink enough fluid to keep your urine pale yellow.  Take over-the-counter or prescription medicines.  Eat foods that are high in fiber, such as beans, whole grains, and fresh fruits and vegetables.  Limit foods that are high in fat and processed sugars, such as fried or sweet foods. Managing pain      If directed, put ice on the affected area. ? Put ice in a plastic bag. ? Place a towel between your skin and the bag. ? Leave the ice  on for 20 minutes, 2-3 times a day.  If directed, apply heat to the affected area. Use the heat source that your health care provider recommends, such as a moist heat pack or a heating pad. ? Place a towel between your skin and the heat source. ? Leave the heat on for 20-30 minutes. ? Remove the heat if your skin turns bright red. This is especially important if you are unable to feel pain, heat, or cold. You may have a greater risk of getting burned. Activity   Return to your normal activities as told by your health care provider. Ask your health care provider what activities are safe for you.  Avoid activities that make your symptoms worse.  Take brief periods of rest throughout the day. ? When you rest for longer periods, mix in some mild activity or stretching between periods of rest. This will help to prevent stiffness and pain. ? Avoid sitting for long periods of time without moving. Get up and move around at least one time each hour.  Exercise and stretch regularly, as told by your health care provider.  Do not lift anything that is heavier than 10 lb (4.5 kg) while you have symptoms of sciatica. When you do not have symptoms, you should still avoid heavy lifting, especially repetitive heavy lifting.  When you lift objects, always use proper lifting technique, which includes: ? Bending your knees. ? Keeping the load close to your body. ? Avoiding twisting. General instructions  Maintain a healthy weight. Excess weight puts extra stress on your back.  Wear supportive, comfortable shoes. Avoid wearing high heels.  Avoid sleeping on a mattress that is too soft or too hard. A mattress that is firm enough to support your back when you sleep may help to reduce your pain.  Keep all follow-up visits as told by your health care provider. This is important. Contact a health care provider if:  You have pain that: ? Wakes you up when you are sleeping. ? Gets worse when you lie  down. ? Is worse than you have experienced in the past. ? Lasts longer than 4 weeks.  You have an unexplained weight loss. Get help right away if:  You are not able to control when you urinate or have bowel movements (incontinence).  You have: ? Weakness in your lower back, pelvis, buttocks, or legs that gets worse. ? Redness or swelling of your back. ? A burning sensation when you urinate. Summary  Sciatica is pain, numbness, weakness, or tingling along the path of the sciatic nerve.  This condition is caused by pressure on the sciatic nerve or pinching of the nerve.  Sciatica can cause pain, numbness, or tingling in the lower back, legs, hips, and buttocks.  Treatment often includes rest, exercise, medicines, and applying ice or heat. This information is not intended to replace advice given to you by your health care provider. Make sure you discuss any questions you have with your health care provider.  Document Revised: 09/20/2018 Document Reviewed: 09/20/2018 [employer] Patient Education  2020 [employer]  

## 2020-08-20 NOTE — Progress Notes (Signed)
Patient has eaten today and taken medication today. Patient complains of back and foot pain at a 6.
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
## 2020-08-21 ENCOUNTER — Encounter: Payer: Self-pay | Admitting: Physician Assistant

## 2020-08-21 ENCOUNTER — Ambulatory Visit: Payer: Medicare Other | Admitting: Family Medicine

## 2020-08-21 DIAGNOSIS — G8929 Other chronic pain: Secondary | ICD-10-CM | POA: Insufficient documentation

## 2020-08-21 DIAGNOSIS — M5441 Lumbago with sciatica, right side: Secondary | ICD-10-CM | POA: Insufficient documentation

## 2020-08-24 ENCOUNTER — Encounter: Payer: Self-pay | Admitting: Family Medicine

## 2020-08-24 ENCOUNTER — Telehealth: Payer: Self-pay | Admitting: Critical Care Medicine

## 2020-08-24 ENCOUNTER — Ambulatory Visit (INDEPENDENT_AMBULATORY_CARE_PROVIDER_SITE_OTHER): Payer: Medicare Other | Admitting: Family Medicine

## 2020-08-24 ENCOUNTER — Other Ambulatory Visit: Payer: Self-pay

## 2020-08-24 DIAGNOSIS — M79671 Pain in right foot: Secondary | ICD-10-CM | POA: Diagnosis not present

## 2020-08-24 DIAGNOSIS — M5441 Lumbago with sciatica, right side: Secondary | ICD-10-CM

## 2020-08-24 DIAGNOSIS — G8929 Other chronic pain: Secondary | ICD-10-CM | POA: Diagnosis not present

## 2020-08-24 NOTE — Progress Notes (Signed)
Office Visit Note   Patient: Paul Blair           Date of Birth: 12/10/1965           MRN: [id number] Visit Date: 08/24/2020 Requested by: Mayers, Kasandra Knudsen, PA-C [address] [place],  [place] [address] PCP: Paul Frisk, MD  Subjective: Chief Complaint  Patient presents with  . Lower Back - Pain    Pain in the lower back down to right foot x years. Has been constant for 8 months. Big toe goes numb. H/o injury on the job years ago.  . Right Foot - Pain    Pain x several months and worsening. Pain lateral aspect of foot. Sharp, throbbing pain in the foot. Started after jumping down from the roof of a house, landing on a piece of metal sticking out of the ground.    HPI: He is here with low back and right leg pain, as well as right foot pain.  He states that he has had back problems for many years.  In the 1980s or 90s he went to physical therapy.  He states that it did not help.  He states that he was told they wanted to do surgery but he was able to make some changes to help with pain the more tolerable.  In the past couple years it has gotten worse.  He has not tried any treatment other than some pain medicine.  Pain is constant.  Also he states that he fell several years ago and hurt his right foot and it continues to bother him since then.  Pain on the lateral aspect.                ROS:   All other systems were reviewed and are negative.  Objective: Vital Signs: There were no vitals taken for this visit.  Physical Exam:  General:  Alert and oriented, in no acute distress. Pulm:  Breathing unlabored. Psy:  Normal mood, congruent affect. Skin: No rash or bruising Low back: He is tender in the midline at the L4-5 level.  He has pain in the right sciatic notch.  Straight leg raise is negative, lower extremity strength and reflexes are normal. Right foot: He is tender at the base of the fifth metatarsal.    Imaging: None today, but 2015 x-rays of his right  foot show appears to be an os cuboid.   Assessment & Plan: 1.  Chronic low back pain with right-sided sciatica -We will try physical therapy.  If he fails to improve, then x-rays and MRI scan followed by epidural injection if indicated.  2.  Chronic right foot pain, possibly due to peroneus brevis tendinopathy versus symptomatic os cuboid -Physical therapy, MRI if symptoms persist.     Procedures: No procedures performed        PMFS History: Patient Active Problem List   Diagnosis Date Noted  . Chronic right-sided low back pain with right-sided sciatica 08/21/2020  . Chronic foot pain, right 08/21/2020  . COVID-19 virus detected 10/18/2019  . Homeless 10/18/2019  . Cocaine abuse with cocaine-induced mood disorder (HCC) 08/20/2019  . Major depressive disorder, single episode, severe (HCC) 08/06/2019  . Major depressive disorder, recurrent episode, severe (HCC) 08/06/2019  . MDD (major depressive disorder), recurrent, severe, with psychosis (HCC) 08/05/2019  . Essential hypertension 05/10/2016  . Personality disorder (HCC) 05/10/2016  . Cannabis dependence (HCC) 05/10/2016  . Cocaine dependence without complication (HCC) 05/10/2016  . Alcohol dependence  with uncomplicated withdrawal (HCC) 11/06/2014  . Substance induced mood disorder (HCC) 11/06/2014  . Alcohol intoxication (HCC)   . Depression with suicidal ideation 11/04/2014   Past Medical History:  Diagnosis Date  . Cocaine abuse (HCC)   . Depression   . History of ETOH abuse   . Homicidal ideations   . Hypertension   . Liver cirrhosis (HCC)   . Substance induced mood disorder (HCC)   . Suicidal ideations     History reviewed. No pertinent family history.  History reviewed. No pertinent surgical history. Social History   Occupational History  . Not on file  Tobacco Use  . Smoking status: Current Every Day Smoker  . Smokeless tobacco: Never Used  Substance and Sexual Activity  . Alcohol use: Yes     Comment: hx abuse- incarcerated at present  . Drug use: Yes    Types: Cocaine, Marijuana  . Sexual activity: Not on file

## 2020-08-24 NOTE — Telephone Encounter (Signed)
Copied from CRM [id number]. Topic: General - Other >> Aug 24, 2020 10:09 AM Lyn Hollingshead D wrote: PT need to speak with a nurse, he states medication is not working / gabapentin (NEURONTIN) 300 MG capsule [[id number]] / please advise

## 2020-08-24 NOTE — Telephone Encounter (Signed)
Attempt to call patient at [phone], goes to VM- LVM to return call.

## 2020-08-28 NOTE — Telephone Encounter (Signed)
pls call pt back re extreme pain he says he is continuing to have as meds are not working  [phone]

## 2020-08-30 NOTE — Telephone Encounter (Signed)
He just had referral to ortho  They said they would manage this and also do MRI if PT unhelpful  He needs to call Dr Prince Rome re this issue

## 2020-08-30 NOTE — Telephone Encounter (Signed)
Patient states he is and has been out of gabapentin sometime now. He was not taking it as directed d/t pain. Completed the Tramadol.  He is requesting pain medication and an MRI of his right foot. Per patient, XR in [place] did not reveal anything abnormal. Physical Therapy does not help.  He has stated he is been very irritable and on edge due to the pain.  Encouraged to f/u with [facility]- per [facility] note 08/24/2020 and was given the phone number- [phone].

## 2020-08-31 ENCOUNTER — Telehealth: Payer: Self-pay

## 2020-08-31 NOTE — Telephone Encounter (Signed)
Called and left a VM advising patient to return cal to schedule an appointment with Dr. Mariane Duval.

## 2020-08-31 NOTE — Telephone Encounter (Addendum)
Patient was transferred to [facility] to f/u for his appt. And other concern. Representative from [facility] called patient while on the phone with Clinical research associate.

## 2020-09-13 ENCOUNTER — Telehealth: Payer: Self-pay | Admitting: Critical Care Medicine

## 2020-09-13 ENCOUNTER — Telehealth: Payer: Self-pay | Admitting: Family Medicine

## 2020-09-13 MED ORDER — CELECOXIB 200 MG PO CAPS
200.0000 mg | ORAL_CAPSULE | Freq: Two times a day (BID) | ORAL | 6 refills | Status: DC | PRN
Start: 2020-09-13 — End: 2020-09-26

## 2020-09-13 MED ORDER — BACLOFEN 10 MG PO TABS: 5.0000 mg | ORAL_TABLET | Freq: Three times a day (TID) | ORAL | 3 refills | Status: DC | PRN

## 2020-09-13 NOTE — Telephone Encounter (Signed)
I called the patient and advised him of the new Rxs. Advised him to not take both meloxicam and celebrex. For now, stop the meloxicam and try the celebrex. Continue Gabapentin. The patient voiced understanding.

## 2020-09-13 NOTE — Telephone Encounter (Signed)
Please advise. He is doing PT. Gabapentin is not working.

## 2020-09-13 NOTE — Telephone Encounter (Signed)
Copied from CRM [id number]. Topic: General - Other >> Sep 13, 2020  9:20 AM Marylen Ponto wrote: Reason for CRM: Pt stated the gabapentin (NEURONTIN) 300 MG capsule is not working and he would like to request that another pain medication be sent to [facility] - [place], [place] - [address]  Phone: [phone]   Fax: [phone]   Per last note from Dr. Delford Field and nurse patient needed to follow up with [facility] about this issue. I called patient and advised him per Dr. Delford Field and nurse to follow up with [facility]. Provided patient with [facility] number per last note. Patient stated he would follow up with Ortho.

## 2020-09-13 NOTE — Telephone Encounter (Signed)
Patient called requesting a pain medication for back and foot pains. Pt states his PCP stated for him to contact his Ortho Dr. Please send to pharmacy on file. Patient is [phone].

## 2020-09-13 NOTE — Telephone Encounter (Signed)
Will call in celebrex and baclofen.

## 2020-09-17 ENCOUNTER — Ambulatory Visit: Payer: Medicare Other | Attending: Family Medicine

## 2020-09-20 ENCOUNTER — Telehealth: Payer: Self-pay | Admitting: Critical Care Medicine

## 2020-09-20 NOTE — Telephone Encounter (Signed)
Copied from CRM [id number]. Topic: General - Other >> Sep 18, 2020 12:41 PM Dalphine Handing A wrote: Edwena Bunde stated that patient is requesting a referral be placed for in home services so that he has a better quality of life. Please advise

## 2020-09-21 NOTE — Telephone Encounter (Signed)
Patient name and DOB verified.  Advised patient to keep appt with Dr. Delford Field on 09/26/2020 to discuss at appt. Patient verbalized understanding.

## 2020-09-26 ENCOUNTER — Ambulatory Visit: Payer: Medicare Other | Attending: Critical Care Medicine | Admitting: Critical Care Medicine

## 2020-09-26 ENCOUNTER — Encounter: Payer: Self-pay | Admitting: Critical Care Medicine

## 2020-09-26 ENCOUNTER — Other Ambulatory Visit: Payer: Self-pay

## 2020-09-26 ENCOUNTER — Telehealth: Payer: Self-pay

## 2020-09-26 VITALS — BP 161/99 | HR 76 | Temp 98.0°F | Resp 16 | Wt 243.0 lb

## 2020-09-26 DIAGNOSIS — F333 Major depressive disorder, recurrent, severe with psychotic symptoms: Secondary | ICD-10-CM

## 2020-09-26 DIAGNOSIS — G8929 Other chronic pain: Secondary | ICD-10-CM

## 2020-09-26 DIAGNOSIS — Z114 Encounter for screening for human immunodeficiency virus [HIV]: Secondary | ICD-10-CM

## 2020-09-26 DIAGNOSIS — M5416 Radiculopathy, lumbar region: Secondary | ICD-10-CM | POA: Diagnosis not present

## 2020-09-26 DIAGNOSIS — I10 Essential (primary) hypertension: Secondary | ICD-10-CM

## 2020-09-26 DIAGNOSIS — N400 Enlarged prostate without lower urinary tract symptoms: Secondary | ICD-10-CM | POA: Insufficient documentation

## 2020-09-26 DIAGNOSIS — Z5900 Homelessness unspecified: Secondary | ICD-10-CM

## 2020-09-26 DIAGNOSIS — Z13220 Encounter for screening for lipoid disorders: Secondary | ICD-10-CM | POA: Diagnosis not present

## 2020-09-26 DIAGNOSIS — Z1159 Encounter for screening for other viral diseases: Secondary | ICD-10-CM

## 2020-09-26 DIAGNOSIS — R350 Frequency of micturition: Secondary | ICD-10-CM

## 2020-09-26 DIAGNOSIS — M79671 Pain in right foot: Secondary | ICD-10-CM | POA: Diagnosis not present

## 2020-09-26 DIAGNOSIS — N401 Enlarged prostate with lower urinary tract symptoms: Secondary | ICD-10-CM

## 2020-09-26 DIAGNOSIS — M5441 Lumbago with sciatica, right side: Secondary | ICD-10-CM

## 2020-09-26 DIAGNOSIS — F10230 Alcohol dependence with withdrawal, uncomplicated: Secondary | ICD-10-CM

## 2020-09-26 DIAGNOSIS — F322 Major depressive disorder, single episode, severe without psychotic features: Secondary | ICD-10-CM

## 2020-09-26 MED ORDER — PANTOPRAZOLE SODIUM 40 MG PO TBEC: 40.0000 mg | DELAYED_RELEASE_TABLET | Freq: Every day | ORAL | 1 refills | Status: DC

## 2020-09-26 MED ORDER — ALBUTEROL SULFATE HFA 108 (90 BASE) MCG/ACT IN AERS: 1.0000 | INHALATION_SPRAY | RESPIRATORY_TRACT | 1 refills | Status: DC | PRN

## 2020-09-26 MED ORDER — SYMBICORT 160-4.5 MCG/ACT IN AERO: 2.0000 | INHALATION_SPRAY | Freq: Two times a day (BID) | RESPIRATORY_TRACT | 11 refills | Status: DC

## 2020-09-26 MED ORDER — TAMSULOSIN HCL 0.4 MG PO CAPS: 0.4000 mg | ORAL_CAPSULE | Freq: Every day | ORAL | 3 refills | Status: DC

## 2020-09-26 MED ORDER — PANTOPRAZOLE SODIUM 40 MG PO TBEC: 40.0000 mg | DELAYED_RELEASE_TABLET | Freq: Every day | ORAL | 0 refills | Status: DC

## 2020-09-26 MED ORDER — VALSARTAN-HYDROCHLOROTHIAZIDE 160-25 MG PO TABS: 1.0000 | ORAL_TABLET | Freq: Every day | ORAL | 1 refills | Status: DC

## 2020-09-26 MED ORDER — HYDROXYZINE HCL 25 MG PO TABS: 25.0000 mg | ORAL_TABLET | Freq: Four times a day (QID) | ORAL | 0 refills | Status: DC | PRN

## 2020-09-26 MED ORDER — DULOXETINE HCL 60 MG PO CPEP: 60.0000 mg | ORAL_CAPSULE | Freq: Every day | ORAL | 3 refills | Status: DC

## 2020-09-26 MED ORDER — ZINC GLUCONATE 50 MG PO TABS
50.0000 mg | ORAL_TABLET | Freq: Every day | ORAL | 1 refills | Status: DC
Start: 2020-09-26 — End: 2021-03-06

## 2020-09-26 MED ORDER — VITAMIN B-12 1000 MCG PO TABS: 1000.0000 ug | ORAL_TABLET | Freq: Every day | ORAL | 6 refills | Status: AC

## 2020-09-26 MED ORDER — VITAMIN D 25 MCG (1000 UNIT) PO TABS: 1000.0000 [IU] | ORAL_TABLET | Freq: Every day | ORAL | 1 refills | Status: DC

## 2020-09-26 NOTE — Telephone Encounter (Signed)
Call received from Brianna/ [facility].  She stated that they discharged the patient in 2017. A new referral for ACTT can be placed and she will send this CM a referral form.  They will then  schedule a CCA for the patient. She did not have a time frame for when services could be initiated

## 2020-09-26 NOTE — Assessment & Plan Note (Signed)
Chronic homelessness patient currently living with his sister but very unstable living arrangements during the interview he was trying to arrange housing but it was apparent he was not following through on what the [employer] was requesting

## 2020-09-26 NOTE — Assessment & Plan Note (Signed)
Recurrent depression  Referral to psychiatry and act team  Increase Cymbalta 60 mg daily

## 2020-09-26 NOTE — Progress Notes (Signed)
Subjective:    Patient ID: Paul Blair, male    DOB: 1965-11-27, 55 y.o.   MRN: [id number]  55 y.o. Paul Blair shelter patient here to est PCP. Hx ETOH, HTN, cocaine use, MDD,   09/26/2020 This patient comes in to establish face-to-face for primary care I been following him at the [facility] previously.  He now is living with his sister.  Patient has history of major depression previously a client of the act team Patient has history of heavy alcohol use cocaine use and hypertension Patient also has severe low back pain and right foot pain.  He has been to see sports medicine they recommended physical therapy he never went for that and is still having the pain no imaging has been yet done  Patient did see one of our providers in the clinic last month and the patient was given a prescription for meloxicam he stated it did not really help the discomfort  Patient still smoking 2 packs a day and has thick productive cough of thick brown mucus.  He states transportation is a barrier for him.  Past Medical History:  Diagnosis Date  . Cocaine abuse (HCC)   . COVID-19 virus detected 10/18/2019   Formatting of this note might be different from the original. 09/2019 at community testing event  . Depression   . Depression with suicidal ideation 11/04/2014  . History of ETOH abuse   . Homicidal ideations   . Hypertension   . Liver cirrhosis (HCC)   . Substance induced mood disorder (HCC)   . Suicidal ideations      History reviewed. No pertinent family history.   Social History   Socioeconomic History  . Marital status: Divorced    Spouse name: Not on file  . Number of children: Not on file  . Years of education: Not on file  . Highest education level: Not on file  Occupational History  . Not on file  Tobacco Use  . Smoking status: Current Every Day Smoker  . Smokeless tobacco: Never Used  Substance and Sexual Activity  . Alcohol use: Yes    Comment: hx abuse- incarcerated  at present  . Drug use: Yes    Types: Cocaine, Marijuana  . Sexual activity: Not on file  Other Topics Concern  . Not on file  Social History Narrative  . Not on file   Social Determinants of Health   Financial Resource Strain: Not on file  Food Insecurity: Not on file  Transportation Needs: Not on file  Physical Activity: Not on file  Stress: Not on file  Social Connections: Not on file  Intimate Partner Violence: Not on file     Allergies  Allergen Reactions  . Tomato     Acid reflux.      Outpatient Medications Prior to Visit  Medication Sig Dispense Refill  . albuterol (VENTOLIN HFA) 108 (90 Base) MCG/ACT inhaler Inhale 1-2 puffs into the lungs every 4 (four) hours as needed for shortness of breath. (Patient not taking: Reported on 09/26/2020)    . amLODipine (NORVASC) 10 MG tablet Take 1 tablet (10 mg total) by mouth daily. (Patient not taking: Reported on 09/26/2020) 90 tablet 1  . baclofen (LIORESAL) 10 MG tablet Take 0.5-1 tablets (5-10 mg total) by mouth 3 (three) times daily as needed for muscle spasms. (Patient not taking: Reported on 09/26/2020) 30 each 3  . celecoxib (CELEBREX) 200 MG capsule Take 1 capsule (200 mg total) by mouth 2 (two)  times daily as needed. (Patient not taking: Reported on 09/26/2020) 60 capsule 6  . cholecalciferol (VITAMIN D3) 25 MCG (1000 UNIT) tablet Take 1 tablet (1,000 Units total) by mouth daily. (Patient not taking: Reported on 09/26/2020) 100 tablet 1  . clotrimazole (CLOTRIMAZOLE ATHLETES FOOT) 1 % cream Apply 1 application topically 2 (two) times daily. (Patient not taking: No sig reported) 30 g 0  . diclofenac (VOLTAREN) 75 MG EC tablet Take 75 mg by mouth 2 (two) times daily. (Patient not taking: Reported on 09/26/2020)    . DULoxetine (CYMBALTA) 20 MG capsule Take 1 capsule (20 mg total) by mouth daily. For depression (Patient not taking: Reported on 09/26/2020) 90 capsule 1  . finasteride (PROSCAR) 5 MG tablet Take 1 tablet (5 mg total) by  mouth daily. (Patient not taking: Reported on 09/26/2020) 90 tablet 3  . fluticasone (FLONASE) 50 MCG/ACT nasal spray Place 1 spray into both nostrils daily as needed for allergies.     Marland Kitchen gabapentin (NEURONTIN) 300 MG capsule Take 2 capsules (600 mg total) by mouth 3 (three) times daily. (Patient not taking: Reported on 09/26/2020) 180 capsule 1  . hydrOXYzine (ATARAX/VISTARIL) 25 MG tablet Take 1 tablet (25 mg total) by mouth every 6 (six) hours as needed. For anxiety (Patient not taking: Reported on 09/26/2020) 75 tablet 0  . ketoconazole (NIZORAL) 2 % shampoo Apply 1 application topically 2 (two) times a week. (Patient not taking: Reported on 09/26/2020)    . meloxicam (MOBIC) 15 MG tablet Take 1 tablet (15 mg total) by mouth daily. (Patient not taking: Reported on 09/26/2020) 30 tablet 0  . nystatin ointment (MYCOSTATIN) Apply topically. (Patient not taking: Reported on 09/26/2020)    . olopatadine (PATANOL) 0.1 % ophthalmic solution Place 1 drop into both eyes 2 (two) times daily. (Patient not taking: Reported on 09/26/2020) 5 mL 12  . pantoprazole (PROTONIX) 40 MG tablet Take 1 tablet (40 mg total) by mouth daily. (Patient not taking: Reported on 09/26/2020) 30 tablet 0  . potassium chloride SA (KLOR-CON) 20 MEQ tablet Take 1 tablet (20 mEq total) by mouth daily. (Patient not taking: Reported on 09/26/2020) 30 tablet 3  . QUEtiapine (SEROQUEL) 50 MG tablet Take 1 tablet (50 mg total) by mouth at bedtime. For mood control (Patient not taking: Reported on 09/26/2020) 30 tablet 0  . tamsulosin (FLOMAX) 0.4 MG CAPS capsule Take 1 capsule (0.4 mg total) by mouth daily. (Patient not taking: Reported on 09/26/2020) 90 capsule 3  . traZODone (DESYREL) 50 MG tablet Take 1 tablet (50 mg total) by mouth at bedtime as needed for sleep. (Patient not taking: Reported on 09/26/2020) 30 tablet 0  . vitamin B-12 (CYANOCOBALAMIN) 1000 MCG tablet Take 1,000 mcg by mouth daily. (Patient not taking: Reported on 09/26/2020)      No facility-administered medications prior to visit.      Review of Systems  Constitutional: Positive for fatigue.  HENT: Negative.   Respiratory: Positive for cough, chest tightness and shortness of breath.   Cardiovascular: Negative for chest pain, palpitations and leg swelling.  Gastrointestinal: Negative.   Genitourinary: Negative.   Psychiatric/Behavioral: Positive for confusion, decreased concentration, dysphoric mood and self-injury. The patient is hyperactive.        Objective:   Physical Exam  Vitals:   09/26/20 1111  BP: (!) 161/99  Pulse: 76  Resp: 16  Temp: 98 F (36.7 C)  SpO2: 96%  Weight: 243 lb (110.2 kg)    Gen: Pleasant, well-nourished, in no distress,  Depressed affect  ENT: No lesions,  mouth clear,  oropharynx clear, no postnasal drip  Neck: No JVD, no TMG, no carotid bruits  Lungs: No use of accessory muscles, no dullness to percussion, decreased breath sounds few rhonchi  Cardiovascular: RRR, heart sounds normal, no murmur or gallops, no peripheral edema  Abdomen: soft and NT, no HSM,  BS normal  Musculoskeletal: No deformities, no cyanosis or clubbing, right foot tender medially  Neuro: alert, non focal  Skin: Warm, no lesions or rashes  No results found.       Assessment & Plan:  I personally [facility] as well as any outside material available during this office visit and agree with the  radiology impressions.   Essential hypertension Hypertension not well controlled begin valsartan HCT  Alcohol dependence with uncomplicated withdrawal History of alcohol dependence patient currently states he is not drinking alcohol daily  Chronic right-sided low back pain with right-sided sciatica Chronic back pain with radiculopathy to the right lower extremity  Obtain MRI of the lumbar spine  Increase Cymbalta to 60 mg daily  Avoid opiates  Chronic foot pain, right Chronic right foot pain  tenderness medially possible tendon injury  MRI of the foot is ordered  Major depressive disorder, recurrent episode, severe (HCC) Recurrent depression  Referral to psychiatry and act team  Increase Cymbalta 60 mg daily  Homeless Chronic homelessness patient currently living with his sister but very unstable living arrangements during the interview he was trying to arrange housing but it was apparent he was not following through on what the Housing [employer] was requesting  BPH (benign prostatic hyperplasia) BPH symptoms Begin Flomax daily   Vonna KotykJay was seen today for back pain and foot pain.  Diagnoses and all orders for this visit:  Lumbar radiculopathy -     MR Lumbar Spine Wo Contrast; Future -     Ambulatory referral to Physical Therapy  Chronic foot pain, right -     MR FOOT RIGHT WO CONTRAST; Future -     Ambulatory referral to Physical Therapy  Essential hypertension -     Comprehensive metabolic panel -     CBC with Differential/Platelet  Lipid screening -     Lipid panel  Benign prostatic hyperplasia with urinary frequency -     Urinalysis  Major depressive disorder, single episode, severe (HCC)  MDD (major depressive disorder), recurrent, severe, with psychosis (HCC) -     Ambulatory referral to Psychiatry  Encounter for screening for HIV -     HIV Antibody (routine testing w rflx)  Need for hepatitis C screening test -     HCV Ab w Reflex to Quant PCR  Alcohol dependence with uncomplicated withdrawal (HCC)  Chronic right-sided low back pain with right-sided sciatica  Severe episode of recurrent major depressive disorder, with psychotic features (HCC)  Homeless  Other orders -     Discontinue: pantoprazole (PROTONIX) 40 MG tablet; Take 1 tablet (40 mg total) by mouth daily. -     albuterol (VENTOLIN HFA) 108 (90 Base) MCG/ACT inhaler; Inhale 1-2 puffs into the lungs every 4 (four) hours as needed for shortness of breath. -     cholecalciferol  (VITAMIN D3) 25 MCG (1000 UNIT) tablet; Take 1 tablet (1,000 Units total) by mouth daily. -     vitamin B-12 (CYANOCOBALAMIN) 1000 MCG tablet; Take 1 tablet (1,000 mcg total) by mouth daily. -     tamsulosin (FLOMAX) 0.4  MG CAPS capsule; Take 1 capsule (0.4 mg total) by mouth daily. -     DULoxetine (CYMBALTA) 60 MG capsule; Take 1 capsule (60 mg total) by mouth daily. For depression -     Discontinue: hydrOXYzine (ATARAX/VISTARIL) 25 MG tablet; Take 1 tablet (25 mg total) by mouth every 6 (six) hours as needed. For anxiety -     valsartan-hydrochlorothiazide (DIOVAN HCT) 160-25 MG tablet; Take 1 tablet by mouth daily. -     zinc gluconate 50 MG tablet; Take 1 tablet (50 mg total) by mouth daily. -     budesonide-formoterol (SYMBICORT) 160-4.5 MCG/ACT inhaler; Inhale 2 puffs into the lungs in the morning and at bedtime. -     hydrOXYzine (ATARAX/VISTARIL) 25 MG tablet; Take 1 tablet (25 mg total) by mouth every 6 (six) hours as needed. For anxiety -     pantoprazole (PROTONIX) 40 MG tablet; Take 1 tablet (40 mg total) by mouth daily.

## 2020-09-26 NOTE — Assessment & Plan Note (Signed)
Chronic back pain with radiculopathy to the right lower extremity  Obtain MRI of the lumbar spine  Increase Cymbalta to 60 mg daily  Avoid opiates

## 2020-09-26 NOTE — Patient Instructions (Signed)
Refills on all your medications sent to your [facility] pharmacy  Multiple labs were ordered please obtain  Referral to physical therapy was made  Referral to psychiatry was made

## 2020-09-26 NOTE — Assessment & Plan Note (Signed)
History of alcohol dependence patient currently states he is not drinking alcohol daily

## 2020-09-26 NOTE — Assessment & Plan Note (Signed)
BPH symptoms Begin Flomax daily

## 2020-09-26 NOTE — Assessment & Plan Note (Signed)
Chronic right foot pain tenderness medially possible tendon injury  MRI of the foot is ordered

## 2020-09-26 NOTE — Progress Notes (Signed)
C/o foot and back pain Request medication: valium and xanax Request HH aide from [facility] Request ACT Team referral

## 2020-09-26 NOTE — Telephone Encounter (Signed)
Met with the patient when he was in the clinic today.  He is requesting to be connected with his ACTT team but he could not remember the name of the agency.   Per Epic notes he was supposed to meet with [facility] in 04/2020.   Call placed to [facility] # [phone], message left with call back requested to this CM.

## 2020-09-26 NOTE — Telephone Encounter (Signed)
Noted  

## 2020-09-26 NOTE — Assessment & Plan Note (Signed)
Hypertension not well controlled begin valsartan HCT

## 2020-09-27 ENCOUNTER — Telehealth: Payer: Self-pay

## 2020-09-27 LAB — LIPID PANEL
Chol/HDL Ratio: 3.9 ratio (ref 0.0–5.0)
Cholesterol, Total: 194 mg/dL (ref 100–199)
HDL: 50 mg/dL (ref >39)
LDL Chol Calc (NIH): 125 mg/dL — ABNORMAL HIGH (ref 0–99)
Triglycerides: 108 mg/dL (ref 0–149)
VLDL Cholesterol Cal: 19 mg/dL (ref 5–40)

## 2020-09-27 LAB — COMPREHENSIVE METABOLIC PANEL
ALT: 20 IU/L (ref 0–44)
AST: 14 IU/L (ref 0–40)
Albumin/Globulin Ratio: 2 (ref 1.2–2.2)
Albumin: 4.5 g/dL (ref 3.8–4.9)
Alkaline Phosphatase: 70 IU/L (ref 44–121)
BUN/Creatinine Ratio: 11 (ref 9–20)
BUN: 11 mg/dL (ref 6–24)
Bilirubin Total: 1.1 mg/dL (ref 0.0–1.2)
CO2: 26 mmol/L (ref 20–29)
Calcium: 8.6 mg/dL — ABNORMAL LOW (ref 8.7–10.2)
Chloride: 105 mmol/L (ref 96–106)
Creatinine, Ser: 0.99 mg/dL (ref 0.76–1.27)
GFR calc Af Amer: 97 mL/min/{1.73_m2} (ref >59)
GFR calc non Af Amer: 84 mL/min/{1.73_m2} (ref >59)
Globulin, Total: 2.3 g/dL (ref 1.5–4.5)
Glucose: 84 mg/dL (ref 65–99)
Potassium: 3.7 mmol/L (ref 3.5–5.2)
Sodium: 144 mmol/L (ref 134–144)
Total Protein: 6.8 g/dL (ref 6.0–8.5)

## 2020-09-27 LAB — CBC WITH DIFFERENTIAL/PLATELET
Basophils Absolute: 0 10*3/uL (ref 0.0–0.2)
Basos: 1 %
EOS (ABSOLUTE): 0.1 10*3/uL (ref 0.0–0.4)
Eos: 3 %
Hematocrit: 49.6 % (ref 37.5–51.0)
Hemoglobin: 17.4 g/dL (ref 13.0–17.7)
Immature Grans (Abs): 0 10*3/uL (ref 0.0–0.1)
Immature Granulocytes: 0 %
Lymphocytes Absolute: 1.7 10*3/uL (ref 0.7–3.1)
Lymphs: 33 %
MCH: 32.2 pg (ref 26.6–33.0)
MCHC: 35.1 g/dL (ref 31.5–35.7)
MCV: 92 fL (ref 79–97)
Monocytes Absolute: 0.4 10*3/uL (ref 0.1–0.9)
Monocytes: 9 %
Neutrophils Absolute: 2.8 10*3/uL (ref 1.4–7.0)
Neutrophils: 54 %
Platelets: 263 10*3/uL (ref 150–450)
RBC: 5.4 x10E6/uL (ref 4.14–5.80)
RDW: 14 % (ref 11.6–15.4)
WBC: 5.1 10*3/uL (ref 3.4–10.8)

## 2020-09-27 LAB — URINALYSIS
Bilirubin, UA: NEGATIVE
Glucose, UA: NEGATIVE
Ketones, UA: NEGATIVE
Leukocytes,UA: NEGATIVE
Nitrite, UA: NEGATIVE
Protein,UA: NEGATIVE
RBC, UA: NEGATIVE
Specific Gravity, UA: 1.013 (ref 1.005–1.030)
Urobilinogen, Ur: 1 mg/dL (ref 0.2–1.0)
pH, UA: 7.5 (ref 5.0–7.5)

## 2020-09-27 NOTE — Telephone Encounter (Signed)
Referral form for [facility] ACT[facility] completed and faxed to attention of Zel[facility] - fax # [phone].  Call placed to patient and informed him of above referral and that [facility] should be contacting him.  He stated that he understood. He was also in agreement to placing a referral for psychiatry.

## 2020-09-28 ENCOUNTER — Other Ambulatory Visit: Payer: Self-pay | Admitting: Pharmacist

## 2020-09-28 MED ORDER — ALBUTEROL SULFATE HFA 108 (90 BASE) MCG/ACT IN AERS: 1.0000 | INHALATION_SPRAY | RESPIRATORY_TRACT | 2 refills | Status: DC | PRN

## 2020-10-01 ENCOUNTER — Encounter: Payer: Self-pay | Admitting: *Deleted

## 2020-10-03 NOTE — Telephone Encounter (Signed)
Referral for psychiatry has been faxed to [facility]

## 2020-10-05 ENCOUNTER — Telehealth (INDEPENDENT_AMBULATORY_CARE_PROVIDER_SITE_OTHER): Payer: Self-pay

## 2020-10-05 NOTE — Telephone Encounter (Signed)
Please send to PCP to place referral. Maryjean Morn, CMA    Copied from CRM [id number]. Topic: General - Other >> Oct 05, 2020 11:06 AM Jaquita Rector A wrote: Reason for CRM: Patient called in to inform Dr Delford Field that he did get all the medication sent to the [facility] but will call once he get home to clarify which ones were missing. Also asking for a referral for the A C T team. Please advise Ph#  [phone]

## 2020-10-05 NOTE — Telephone Encounter (Signed)
Patient name and DOB verified.   Dr. Delford Field, Patient request referral to ACT team. Please advise.    ______________________________________________________  He still has not made it home. States he will call office back before 5pm. With the medications that he was given. He was only given 4.   This nurse will call the pharmacy to see which ones were picked up.   Spoke to Merla Riches at [address]   States on the 1/12 patient picked up-  Cymbalta Valsartan HCTZ  Protonix  Hydroxyzine  States on the 1/14 patient picked up- Amlodipine Gabapentin  States these medications were on hold but aren't any longer and patient may pick up- Tamsulosin  Symbicort Albuterol   Zn and VD are OTC

## 2020-10-07 NOTE — Telephone Encounter (Signed)
Erskine Squibb was partnering with the patient to get ACTT referral and I signed the referral  Please confer with Erskine Squibb whom I added to this msg

## 2020-10-08 ENCOUNTER — Telehealth: Payer: Self-pay

## 2020-10-08 NOTE — Telephone Encounter (Signed)
Call placed to [facility] # [phone] to check on status of ACTT referral. Message left with call back requested to this CM.   Call placed to patient and informed him of referral that was placed to [facility] as well as referral sent to [facility].  He said that he has not heard from either agency.  When asked if he would like the # for [facility] to call about the ACTT he asked that the phone number be text to him and he would call.  Number then text to him as requested

## 2020-10-18 ENCOUNTER — Telehealth: Payer: Self-pay

## 2020-10-18 NOTE — Telephone Encounter (Signed)
Message received from [place] /[facility] requesting referral be refaxed.  email then sent to her to confirm fax number

## 2020-10-22 ENCOUNTER — Ambulatory Visit (HOSPITAL_COMMUNITY): Payer: Medicare Other

## 2020-10-23 ENCOUNTER — Ambulatory Visit (HOSPITAL_COMMUNITY): Admission: RE | Admit: 2020-10-23 | Payer: Medicare Other | Source: Ambulatory Visit

## 2020-10-23 ENCOUNTER — Ambulatory Visit (HOSPITAL_COMMUNITY): Payer: Medicare Other

## 2020-11-10 ENCOUNTER — Ambulatory Visit (HOSPITAL_COMMUNITY): Admission: RE | Admit: 2020-11-10 | Payer: Medicare Other | Source: Ambulatory Visit

## 2020-11-10 ENCOUNTER — Ambulatory Visit (HOSPITAL_COMMUNITY): Payer: Medicare Other

## 2020-11-22 ENCOUNTER — Ambulatory Visit: Payer: Self-pay | Admitting: *Deleted

## 2020-11-22 ENCOUNTER — Ambulatory Visit: Payer: Self-pay

## 2020-11-22 ENCOUNTER — Telehealth: Payer: Self-pay | Admitting: Critical Care Medicine

## 2020-11-22 NOTE — Telephone Encounter (Signed)
Pt called back and tells [facility] agent he has not heard from Dr Delford Field and he is going to the ED; please see previous nurse triage note from 11/22/20 at 0921; will route to office for notification.Marland Kitchen

## 2020-11-22 NOTE — Telephone Encounter (Addendum)
Pt called in c/o having dizziness, headaches and lightheadedness and along with his BP still being elevated after starting Diovan HCT 160-25 on 09/26/2020.   He saw Dr. Shan Levans in the mobile clinic.  He was on Amlodipine but it was discontinued to start the Diovan.  He only had 1 reading which was yesterday 190/119.    I asked him what his reading was this morning and he stated he had not gone and had it checked this morning.    He was told by the nurse that works in the mobile clinic with Dr. Delford Field to give the medicine a little more time to work when he mentioned it to them.   He doesn't recall when that was.   He also said the nurse at the [facility] told him the same thing to give the medicine time to work.  I let him know I would send Dr. Delford Field a note letting him know what is going on.    Someone would call him back but in the meantime if he gets worse to go to the ED.  He can be reached at [phone].  I sent my notes to [facility] for Dr. Delford Field high priority.  I also called [facility] and made them aware of my note and pt's issues.   She is going to route my notes to Dr. Delford Field now to be addressed by him.         Reason for Disposition . [1] Caller has URGENT medicine question about med that PCP or specialist prescribed AND [2] triager unable to answer question    Started on Diavan HCT 160-25 for hyperrtension  Answer Assessment - Initial Assessment Questions 1. NAME of MEDICATION: "What medicine are you calling about?"     Diovan  A new medication.     Started about a month ago. 2. QUESTION: "What is your question?" (e.g., medication refill, side effect)     My BP is still high.     BP 190/119 yesterday.    The nurse at the mobile clinic told me to give it a little more time.    The [employer] that works with the dr in the mobile clinic told me to give it time. 3. PRESCRIBING HCP: "Who prescribed it?" Reason: if prescribed by specialist,  call should be referred to that group.     Dr. Shan Levans 4. SYMPTOMS: "Do you have any symptoms?"     Lightheadedness, dizziness, and headaches.   5. SEVERITY: If symptoms are present, ask "Are they mild, moderate or severe?"     Every day all day.    Sometimes I can't go anywhere or do anything because of these symptoms. 6. PREGNANCY:  "Is there any chance that you are pregnant?" "When was your last menstrual period?"     N/A  Protocols used: MEDICATION QUESTION CALL-A-AH

## 2020-11-22 NOTE — Telephone Encounter (Signed)
Just now seeing two prior messages.  I am not able to open or respond to either.  Pt should go to UC or ED if his BP is that high given his symptoms  His symptoms are due to HTN and he needs additional therapy.

## 2020-11-22 NOTE — Telephone Encounter (Signed)
Patient was contacted and advised to go to [facility] or ED if symptoms continue.

## 2020-11-22 NOTE — Telephone Encounter (Signed)
  Reason for Disposition . Patient already left for the hospital/clinic.  Protocols used: NO CONTACT OR DUPLICATE CONTACT CALL-A-AH

## 2020-11-28 ENCOUNTER — Ambulatory Visit: Payer: Self-pay | Admitting: *Deleted

## 2020-11-28 NOTE — Telephone Encounter (Signed)
Runny nose, cough, chills, fatigue, loss of appetite/smell/taste since Sunday. Denies SOB at this time. Care Advice provided and Covid testing appointment made at the [facility] location for tomorrow. Advised quarantine until receiving a negative Covid results. Voiced understanding information provided. Wear mask to testing. Increase daily water intake, try Mucinex for thick phlegm.

## 2020-11-28 NOTE — Telephone Encounter (Signed)
Reason for Disposition  . [1] COVID-19 infection suspected by caller or triager AND [2] mild symptoms (cough, fever, or others) AND [3] has not gotten tested yet    Protocols used: CORONAVIRUS (COVID-19) DIAGNOSED OR SUSPECTED-A-AH

## 2020-11-29 ENCOUNTER — Other Ambulatory Visit: Payer: Medicare Other

## 2020-12-03 ENCOUNTER — Telehealth: Payer: Self-pay | Admitting: Critical Care Medicine

## 2020-12-03 NOTE — Telephone Encounter (Signed)
Copied from CRM [id number]. Topic: General - Other >> Dec 03, 2020 10:40 AM Gaetana Michaelis A wrote: Reason for CRM: Patient would like to be contacted by staff regarding their lisinopril (PRINIVIL,ZESTRIL) 20 MG tablet prescription  Patient would like to begin taking the medication again and would also like to discuss their blood pressure with staff when possible  Patient donates plasma regularly and is required to provide a note from their PCP to ensure that they are able to continue donating  Please contact to advise further

## 2020-12-03 NOTE — Telephone Encounter (Signed)
Matter will be discussed at his upcoming visit.

## 2020-12-05 ENCOUNTER — Ambulatory Visit: Payer: Medicare Other | Attending: Physician Assistant | Admitting: Physician Assistant

## 2020-12-05 ENCOUNTER — Other Ambulatory Visit: Payer: Self-pay

## 2020-12-05 ENCOUNTER — Encounter: Payer: Self-pay | Admitting: Physician Assistant

## 2020-12-05 DIAGNOSIS — N529 Male erectile dysfunction, unspecified: Secondary | ICD-10-CM | POA: Diagnosis not present

## 2020-12-05 DIAGNOSIS — I10 Essential (primary) hypertension: Secondary | ICD-10-CM

## 2020-12-05 MED ORDER — SILDENAFIL CITRATE 100 MG PO TABS: 50.0000 mg | ORAL_TABLET | Freq: Every day | ORAL | 0 refills | Status: DC | PRN

## 2020-12-05 MED ORDER — LISINOPRIL 20 MG PO TABS: 20.0000 mg | ORAL_TABLET | Freq: Every day | ORAL | 3 refills | Status: DC

## 2020-12-05 NOTE — Progress Notes (Signed)
Virtual Visit via Telephone Note  I connected with Paul Blair on 12/05/20 at  3:50 PM EDT by telephone and verified that I am speaking with the correct person using two identifiers.  Location: Patient: home Provider: [facility] office   I discussed the limitations, risks, security and privacy concerns of performing an evaluation and management service by telephone and the availability of in person appointments. I also discussed with the patient that there may be a patient responsible charge related to this service. The patient expressed understanding and agreed to proceed.   History of Present Illness:  Patient has been taking valsartan/HCTZ And says BPs running 170s /90s.  He wants to go back on lisinopril bc he says it was better controlled on that and wants to be able to donate plasma.  He says he has a form that needs to be filled out.  He has a BP cuff at home but has not been checking it regularly.   Also wants some viagra.  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  Denies any CP or h/o cardiac events.     Observations/Objective: NAD.  TP scattered.  A&Ox3   Assessment and Plan: 1. Essential hypertension Stop valsartan HCTZ and will restart lisinopril at a higher dose.  Check BP daily and record and bring to next visit.  I told him he can bring the forms about plasma donation, but I am unsure that it will be something we can fill out for him.   - lisinopril (ZESTRIL) 20 MG tablet; Take 1 tablet (20 mg total) by mouth daily.  Dispense: 90 tablet; Refill: 3  2. Erectile dysfunction, unspecified erectile dysfunction type I will give him one prescription and have him follow with PCP for further evaluation.  No nitrates if develops CP.   - sildenafil (VIAGRA) 100 MG tablet; Take 0.5-1 tablets (50-100 mg total) by mouth daily as needed for erectile dysfunction.  Dispense: 5 tablet; Refill: 0   Follow Up Instructions: See DR Wright-next available appt/asap per patient request-virtual  would be fine   I discussed the assessment and treatment plan with the patient. The patient was provided an opportunity to ask questions and all were answered. The patient agreed with the plan and demonstrated an understanding of the instructions.   The patient was advised to call back or seek an in-person evaluation if the symptoms worsen or if the condition fails to improve as anticipated.  I provided 19 minutes of non-face-to-face time during this encounter.   Georgian Co, PA-C  Patient ID: Paul Blair, male   DOB: 1965-11-22, 55 y.o.   MRN: [id number]

## 2020-12-16 NOTE — Progress Notes (Addendum)
Subjective:    Patient ID: Paul Blair, male    DOB: Jul 26, 1966, 55 y.o.   MRN: [id number] Virtual Visit via Telephone Note  I connected with Paul Blair on 12/20/20 at 10:30 AM EDT by telephone and verified that I am speaking with the correct person using two identifiers.   Consent:  I discussed the limitations, risks, security and privacy concerns of performing an evaluation and management service by telephone and the availability of in person appointments. I also discussed with the patient that there may be a patient responsible charge related to this service. The patient expressed understanding and agreed to proceed.  Location of patient: The patient is homeless he was somewhere off the [employer] area  Location of provider: I am in my office  Persons participating in the televisit with the patient.   No one else on the call    History of Present Illness: 55 y.o. Paul Blair shelter patient here to est PCP. Hx ETOH, HTN, cocaine use, MDD,   09/26/2020 This patient comes in to establish face-to-face for primary care I been following him at the [facility] clinic previously.  He now is living with his sister.  Patient has history of major depression previously a client of the act team Patient has history of heavy alcohol use cocaine use and hypertension Patient also has severe low back pain and right foot pain.  He has been to see sports medicine they recommended physical therapy he never went for that and is still having the pain no imaging has been yet done  Patient did see one of our providers in the clinic last month and the patient was given a prescription for meloxicam he stated it did not really help the discomfort  Patient still smoking 2 packs a day and has thick productive cough of thick brown mucus.  He states transportation is a barrier for him.  12/17/2020 This was a phone visit with this 55 year old male former wet resident of the [facility] shelter now and homeless  situation.  He was with his sister now he is living on the street again.  He has not had his MRI of his lumbar and foot obtained as of yet.  He remains very depressed was not able to engage with mental health is drinking more excessively.  He is taking lisinopril 20 mg daily and states his blood pressure is less than 140/90.  It is hard to verify this information.  The patient was quite anxious at and was not able to complete this visit after 10 minutes.  Patient states he is not in a good frame of mind.  He denies cocaine but is drinking excessively.  He is out of his Cymbalta and out of his gabapentin   Past Medical History:  Diagnosis Date  . Cocaine abuse (HCC)   . COVID-19 virus detected 10/18/2019   Formatting of this note might be different from the original. 09/2019 at community testing event  . Depression   . Depression with suicidal ideation 11/04/2014  . History of ETOH abuse   . Homicidal ideations   . Hypertension   . Liver cirrhosis (HCC)   . Substance induced mood disorder (HCC)   . Suicidal ideations      History reviewed. No pertinent family history.   Social History   Socioeconomic History  . Marital status: Divorced    Spouse name: Not on file  . Number of children: Not on file  . Years of education: Not on  file  . Highest education level: Not on file  Occupational History  . Not on file  Tobacco Use  . Smoking status: Current Every Day Smoker  . Smokeless tobacco: Never Used  Substance and Sexual Activity  . Alcohol use: Yes    Comment: hx abuse- incarcerated at present  . Drug use: Yes    Types: Cocaine, Marijuana  . Sexual activity: Not on file  Other Topics Concern  . Not on file  Social History Narrative  . Not on file   Social Determinants of Health   Financial Resource Strain: Not on file  Food Insecurity: Not on file  Transportation Needs: Not on file  Physical Activity: Not on file  Stress: Not on file  Social Connections: Not on file   Intimate Partner Violence: Not on file     Allergies  Allergen Reactions  . Tomato     Acid reflux.      Outpatient Medications Prior to Visit  Medication Sig Dispense Refill  . lisinopril (ZESTRIL) 20 MG tablet Take 1 tablet (20 mg total) by mouth daily. 90 tablet 3  . pantoprazole (PROTONIX) 40 MG tablet Take 1 tablet (40 mg total) by mouth daily. 60 tablet 1  . albuterol (VENTOLIN HFA) 108 (90 Base) MCG/ACT inhaler Inhale 1-2 puffs into the lungs every 4 (four) hours as needed for shortness of breath. 8.5 g 2  . sildenafil (VIAGRA) 100 MG tablet Take 0.5-1 tablets (50-100 mg total) by mouth daily as needed for erectile dysfunction. 5 tablet 0  . vitamin B-12 (CYANOCOBALAMIN) 1000 MCG tablet Take 1 tablet (1,000 mcg total) by mouth daily. 60 tablet 6  . zinc gluconate 50 MG tablet Take 1 tablet (50 mg total) by mouth daily. 60 tablet 1  . amLODipine (NORVASC) 10 MG tablet Take 1 tablet by mouth daily. (Patient not taking: Reported on 12/17/2020)    . budesonide-formoterol (SYMBICORT) 160-4.5 MCG/ACT inhaler Inhale 2 puffs into the lungs in the morning and at bedtime. 10 each 11  . cholecalciferol (VITAMIN D3) 25 MCG (1000 UNIT) tablet Take 1 tablet (1,000 Units total) by mouth daily. 100 tablet 1  . DULoxetine (CYMBALTA) 60 MG capsule Take 1 capsule (60 mg total) by mouth daily. For depression (Patient not taking: Reported on 12/17/2020) 30 capsule 3  . gabapentin (NEURONTIN) 300 MG capsule Take 1 capsule by mouth 3 (three) times daily.    . hydrOXYzine (ATARAX/VISTARIL) 25 MG tablet Take 1 tablet (25 mg total) by mouth every 6 (six) hours as needed. For anxiety (Patient not taking: Reported on 12/17/2020) 75 tablet 0  . tamsulosin (FLOMAX) 0.4 MG CAPS capsule Take 1 capsule (0.4 mg total) by mouth daily. (Patient not taking: Reported on 12/17/2020) 90 capsule 3  . traMADol (ULTRAM) 50 MG tablet TAKE 1 TABLET BY MOUTH EVERY 8 HOURS AS NEEDED FOR UP TO 7 DAYS 20 tablet 0   No  facility-administered medications prior to visit.      Review of Systems  Constitutional: Positive for fatigue.  HENT: Negative.   Respiratory: Positive for cough, chest tightness and shortness of breath.   Cardiovascular: Negative for chest pain, palpitations and leg swelling.  Gastrointestinal: Negative.   Genitourinary: Negative.   Psychiatric/Behavioral: Positive for confusion, decreased concentration, dysphoric mood and self-injury. The patient is hyperactive.        Objective:   Physical Exam  There were no vitals filed for this visit.  No exam this is a telephone visit     Assessment &  Plan:  I personally reviewed all images and lab data in the [facility] system as well as any outside material available during this office visit and agree with the  radiology impressions.  Not able to complete this call Pt not able to engage or answer any ?s  See below No problem-specific Assessment & Plan notes found for this encounter.   There are no diagnoses linked to this encounter.  Follow Up Instructions: This phone visit only lasted about 10 minutes it was obvious the patient was not able to engage at the time I made the call we plan to bring the patient in for direct office exam in the next month and will proceed to try to order the MRI of the lower back and right foot   I discussed the assessment and treatment plan with the patient. The patient was provided an opportunity to ask questions and all were answered. The patient agreed with the plan and demonstrated an understanding of the instructions.   The patient was advised to call back or seek an in-person evaluation if the symptoms worsen or if the condition fails to improve as anticipated.  I provided 10 minutes of non-face-to-face time during this encounter  including  median intraservice time , review of notes, labs, imaging, medications  and explaining diagnosis and management to the patient .    Shan Levans, MD

## 2020-12-17 ENCOUNTER — Other Ambulatory Visit: Payer: Self-pay

## 2020-12-17 ENCOUNTER — Ambulatory Visit: Payer: Medicare Other | Attending: Critical Care Medicine | Admitting: Critical Care Medicine

## 2020-12-17 ENCOUNTER — Encounter: Payer: Self-pay | Admitting: Critical Care Medicine

## 2020-12-17 DIAGNOSIS — F1994 Other psychoactive substance use, unspecified with psychoactive substance-induced mood disorder: Secondary | ICD-10-CM | POA: Diagnosis not present

## 2020-12-17 DIAGNOSIS — F10230 Alcohol dependence with withdrawal, uncomplicated: Secondary | ICD-10-CM

## 2020-12-17 DIAGNOSIS — F333 Major depressive disorder, recurrent, severe with psychotic symptoms: Secondary | ICD-10-CM | POA: Diagnosis not present

## 2020-12-17 MED ORDER — PANTOPRAZOLE SODIUM 40 MG PO TBEC: 40.0000 mg | DELAYED_RELEASE_TABLET | Freq: Every day | ORAL | 1 refills | Status: DC

## 2020-12-17 MED ORDER — SYMBICORT 80-4.5 MCG/ACT IN AERO: 2.0000 | INHALATION_SPRAY | Freq: Two times a day (BID) | RESPIRATORY_TRACT | 3 refills | Status: DC

## 2020-12-17 MED ORDER — TAMSULOSIN HCL 0.4 MG PO CAPS: 0.8000 mg | ORAL_CAPSULE | Freq: Every day | ORAL | 3 refills | Status: DC

## 2020-12-17 MED ORDER — GABAPENTIN 300 MG PO CAPS: 1.0000 | ORAL_CAPSULE | Freq: Three times a day (TID) | ORAL | 1 refills | Status: DC

## 2020-12-17 MED ORDER — DULOXETINE HCL 60 MG PO CPEP: 60.0000 mg | ORAL_CAPSULE | Freq: Every day | ORAL | 3 refills | Status: DC

## 2020-12-17 MED ORDER — HYDROXYZINE HCL 25 MG PO TABS: 25.0000 mg | ORAL_TABLET | Freq: Four times a day (QID) | ORAL | 1 refills | Status: DC | PRN

## 2020-12-17 NOTE — Progress Notes (Signed)
Discuss blood pressure. Having back pain.

## 2020-12-21 ENCOUNTER — Telehealth: Payer: Self-pay | Admitting: Critical Care Medicine

## 2020-12-21 NOTE — Telephone Encounter (Signed)
Pt is calling to get clarity on letter for donating blood plasma. Pt is unable to read the letter Please advise [phone]- [phone]

## 2020-12-24 NOTE — Telephone Encounter (Signed)
The document is hand written on a form  He needs to give the form to the donation center.

## 2020-12-25 NOTE — Telephone Encounter (Signed)
Per Dr.Wright patient is to give the letter to the Plasma center.

## 2020-12-26 NOTE — Telephone Encounter (Signed)
Pt called stating that he is not able to understand the writing that was on the letter and is requesting to have clarification. Please advise.

## 2020-12-26 NOTE — Telephone Encounter (Signed)
Called patient to inform him of Dr. Lynelle Doctor response. Patient did not answer and LVM to return call.   If patient calls back please inform him that he needs to give the letter to the [facility].

## 2021-02-27 ENCOUNTER — Telehealth: Payer: Self-pay | Admitting: Critical Care Medicine

## 2021-02-27 NOTE — Telephone Encounter (Signed)
Copied from CRM [id number]. Topic: Appointment Scheduling - Scheduling Inquiry for Clinic >> Feb 22, 2021 12:33 PM Paul Blair wrote: Reason for CRM: Patient would like to see Dr. Delford Field much sooner then 04/09/2021 due to ongoing back pain and prostate concerns. Patient did state he will walk in to see if PCP can see him sooner. Also patient would like Blair follow up call regarding new MR orders for back pain and foot concerns. >> Feb 26, 2021  9:31 AM Paul Blair wrote: Patient would like to be contacted at [phone] to discuss further   The patient currently has Blair different number and can be reached at the new one for the time being   Checked Dr. Delford Field schedule and he is fully booked. He has open double book slots for the week of July 18th. Anything before than he is either double or triple booked already. Please advise is patient can be worked in sooner or offer the appts in July.

## 2021-03-01 NOTE — Telephone Encounter (Signed)
Unable to reach. No voicemail.

## 2021-03-03 ENCOUNTER — Other Ambulatory Visit: Payer: Self-pay | Admitting: Critical Care Medicine

## 2021-03-03 NOTE — Telephone Encounter (Signed)
Requested Prescriptions  Pending Prescriptions Disp Refills  . gabapentin (NEURONTIN) 300 MG capsule [Pharmacy Med Name: Gabapentin 300 MG Oral Capsule] 90 capsule 0    Sig: TAKE 1 CAPSULE BY MOUTH THREE TIMES DAILY     Neurology: Anticonvulsants - gabapentin Passed - 03/03/2021 10:05 AM      Passed - Valid encounter within last 12 months    Recent Outpatient Visits          2 months ago Substance induced mood disorder [facility])   [facility] Storm Frisk, MD   2 months ago Essential hypertension   [facility] [place], [place], [place]   5 months ago Lumbar radiculopathy   [facility] Storm Frisk, MD   6 months ago Chronic right-sided low back pain with right-sided sciatica   [facility] [place], [place], [place]      Future Appointments            In 1 month Delford Field, Charlcie Cradle, MD [facility]

## 2021-03-06 ENCOUNTER — Ambulatory Visit: Payer: Medicare Other | Attending: Critical Care Medicine | Admitting: Critical Care Medicine

## 2021-03-06 ENCOUNTER — Telehealth: Payer: Self-pay

## 2021-03-06 ENCOUNTER — Encounter: Payer: Self-pay | Admitting: Critical Care Medicine

## 2021-03-06 ENCOUNTER — Other Ambulatory Visit: Payer: Self-pay

## 2021-03-06 VITALS — BP 151/96 | HR 80 | Resp 16 | Wt 234.4 lb

## 2021-03-06 DIAGNOSIS — M5441 Lumbago with sciatica, right side: Secondary | ICD-10-CM

## 2021-03-06 DIAGNOSIS — F332 Major depressive disorder, recurrent severe without psychotic features: Secondary | ICD-10-CM

## 2021-03-06 DIAGNOSIS — M5416 Radiculopathy, lumbar region: Secondary | ICD-10-CM | POA: Diagnosis not present

## 2021-03-06 DIAGNOSIS — Z87898 Personal history of other specified conditions: Secondary | ICD-10-CM | POA: Diagnosis not present

## 2021-03-06 DIAGNOSIS — G8929 Other chronic pain: Secondary | ICD-10-CM

## 2021-03-06 DIAGNOSIS — F17200 Nicotine dependence, unspecified, uncomplicated: Secondary | ICD-10-CM

## 2021-03-06 DIAGNOSIS — I10 Essential (primary) hypertension: Secondary | ICD-10-CM

## 2021-03-06 DIAGNOSIS — R350 Frequency of micturition: Secondary | ICD-10-CM

## 2021-03-06 DIAGNOSIS — Z5900 Homelessness unspecified: Secondary | ICD-10-CM

## 2021-03-06 DIAGNOSIS — N401 Enlarged prostate with lower urinary tract symptoms: Secondary | ICD-10-CM

## 2021-03-06 DIAGNOSIS — F1411 Cocaine abuse, in remission: Secondary | ICD-10-CM

## 2021-03-06 MED ORDER — ALBUTEROL SULFATE HFA 108 (90 BASE) MCG/ACT IN AERS: 1.0000 | INHALATION_SPRAY | RESPIRATORY_TRACT | 2 refills | Status: DC | PRN

## 2021-03-06 MED ORDER — PANTOPRAZOLE SODIUM 40 MG PO TBEC: 40.0000 mg | DELAYED_RELEASE_TABLET | Freq: Every day | ORAL | 1 refills | Status: DC

## 2021-03-06 MED ORDER — LOSARTAN POTASSIUM-HCTZ 100-25 MG PO TABS: 1.0000 | ORAL_TABLET | Freq: Every day | ORAL | 2 refills | Status: DC

## 2021-03-06 MED ORDER — DICLOFENAC SODIUM 75 MG PO TBEC: 75.0000 mg | DELAYED_RELEASE_TABLET | Freq: Two times a day (BID) | ORAL | 1 refills | Status: DC

## 2021-03-06 MED ORDER — AMLODIPINE BESYLATE 10 MG PO TABS: 1.0000 | ORAL_TABLET | Freq: Every day | ORAL | 1 refills | Status: DC

## 2021-03-06 MED ORDER — FINASTERIDE 5 MG PO TABS: 5.0000 mg | ORAL_TABLET | Freq: Every day | ORAL | 2 refills | Status: DC

## 2021-03-06 MED ORDER — BUDESONIDE-FORMOTEROL FUMARATE 160-4.5 MCG/ACT IN AERO: 2.0000 | INHALATION_SPRAY | Freq: Two times a day (BID) | RESPIRATORY_TRACT | 3 refills | Status: DC

## 2021-03-06 MED ORDER — ARIPIPRAZOLE 5 MG PO TABS: 5.0000 mg | ORAL_TABLET | Freq: Every day | ORAL | 1 refills | Status: DC

## 2021-03-06 MED ORDER — DULOXETINE HCL 60 MG PO CPEP: 60.0000 mg | ORAL_CAPSULE | Freq: Every day | ORAL | 3 refills | Status: DC

## 2021-03-06 MED ORDER — GABAPENTIN 600 MG PO TABS: 1200.0000 mg | ORAL_TABLET | Freq: Two times a day (BID) | ORAL | 1 refills | Status: DC

## 2021-03-06 MED ORDER — HYDROXYZINE HCL 50 MG PO TABS: 50.0000 mg | ORAL_TABLET | Freq: Three times a day (TID) | ORAL | 1 refills | Status: DC | PRN

## 2021-03-06 MED ORDER — CYCLOBENZAPRINE HCL 10 MG PO TABS: 1.0000 | ORAL_TABLET | Freq: Three times a day (TID) | ORAL | 1 refills | Status: DC | PRN

## 2021-03-06 MED ORDER — TAMSULOSIN HCL 0.4 MG PO CAPS: 0.8000 mg | ORAL_CAPSULE | Freq: Every day | ORAL | 3 refills | Status: DC

## 2021-03-06 NOTE — Assessment & Plan Note (Signed)
  .   Current smoking consumption amount: 4 cigarettes daily  . Dicsussion on advise to quit smoking and smoking impacts: Cardiovascular lung impacts  . Patient's willingness to quit: Not ready to quit  . Methods to quit smoking discussed: Behavioral modification nicotine replacement  . Medication management of smoking session drugs discussed: Nicotine replacement  . Resources provided:  AVS   . Setting quit date not established  . Follow-up arranged 3 months  Time spent counseling the patient: 5 minutes

## 2021-03-06 NOTE — Assessment & Plan Note (Signed)
Currently is abstinent from alcohol

## 2021-03-06 NOTE — Assessment & Plan Note (Signed)
Plan to refill cyclobenzaprine, increase gabapentin, avoid opiates, obtain MRI of lumbar spine

## 2021-03-06 NOTE — Patient Instructions (Signed)
Refills on all your medications sent to your [facility] pharmacy  MRI of the lumbar spine will be obtained  Please follow-up with your psychiatric counselors and let me know what medications they are prescribing for you  Return to see Dr. Delford Field in 3 months

## 2021-03-06 NOTE — Assessment & Plan Note (Signed)
Patient states he is currently abstinent

## 2021-03-06 NOTE — Assessment & Plan Note (Signed)
Plan to resume losartan HCT not well controlled out of medication

## 2021-03-06 NOTE — Telephone Encounter (Signed)
CREATED IN ERROR

## 2021-03-06 NOTE — Telephone Encounter (Signed)
Presented to room with Transitional Care Coordinator. Patient endorsed passive SI without plan and intent. Patient identified protective factors as family. Patient was provided with crisis resources and advised to utilize if SI arises with plan, means, and intent. Patient was advised to schedule appt with [facility] psychiatrist as pt mentioned having inconsistent services with [facility] and is unsure of current medications. Patient stated that he has been advised in the past that it was best for him to be involved  in consistent medication management and pt has motivation for tx. Pt also mentioned that he needs disability paperwork completed and pt was advised to bring paperwork when he sees [facility] psychiatrist. Pt agreed to attend [facility] if a suicide crisis arises and to utilize provided crisis resources. LCSW will schedule follow-up with pt upon schedule opening.

## 2021-03-06 NOTE — Progress Notes (Addendum)
Established Patient Office Visit  Subjective:  Patient ID: Paul Blair, male    DOB: [birth year]  Age: 55 y.o. MRN: [id number]  CC:  Chief Complaint  Patient presents with   Back Pain    HPI [name] presents for BPH and back pain, hx homelessness Today the patient's primary complaint is that of low back pain and right lower extremity pain as well.  He now is in a boardinghouse and this is relieved some of his stress.  He has chronic anxiety chronic depression.  He is on Abilify 5 mg daily he states the trazodone does not help him sleep and he is off this.  Patient is on hydroxyzine low-dose but he states it is not helping.  His smoking is down to 4 cigarettes a day is no longer using cocaine or alcohol.  On arrival blood pressure is 151/96 however he has not been taking his blood pressure medicine as he ran out.  Patient had a lumbar MRI scheduled but this is yet to be performed he wishes to proceed with this.  He is on the Cymbalta but did not get refills on it.  Patient now has a community access team for mental health called [facility].  He does not member the names of the medicines they are giving and he will bring those with him in the next visit. The patient is out of his current prostate medications.  This is because difficulty with his urine output. Past Medical History:  Diagnosis Date   Cocaine abuse (HCC)    COVID-19 virus detected 10/18/2019   Formatting of this note might be different from the original. 09/2019 at community testing event   Depression    Depression with suicidal ideation 11/04/2014   History of ETOH abuse    Homicidal ideations    Hypertension    Liver cirrhosis (HCC)    Substance induced mood disorder (HCC)    Substance induced mood disorder (HCC) 11/06/2014   Suicidal ideations     History reviewed. No pertinent surgical history.  History reviewed. No pertinent family history.  Social History   Socioeconomic History   Marital status: Divorced    Spouse  name: Not on file   Number of children: Not on file   Years of education: Not on file   Highest education level: Not on file  Occupational History   Not on file  Tobacco Use   Smoking status: Every Day    Pack years: 0.00   Smokeless tobacco: Never  Substance and Sexual Activity   Alcohol use: Yes    Comment: hx abuse- incarcerated at present   Drug use: Yes    Types: Cocaine, Marijuana   Sexual activity: Not on file  Other Topics Concern   Not on file  Social History Narrative   Not on file   Social Determinants of Health   Financial Resource Strain: Not on file  Food Insecurity: Not on file  Transportation Needs: Not on file  Physical Activity: Not on file  Stress: Not on file  Social Connections: Not on file  Intimate Partner Violence: Not on file    Outpatient Medications Prior to Visit  Medication Sig Dispense Refill   albuterol (VENTOLIN HFA) 108 (90 Base) MCG/ACT inhaler Inhale 1-2 puffs into the lungs every 4 (four) hours as needed for shortness of breath. 8.5 g 2   amLODipine (NORVASC) 10 MG tablet Take 1 tablet by mouth daily.     gabapentin (NEURONTIN) 600 MG tablet Take  600 mg by mouth 2 (two) times daily.     losartan-hydrochlorothiazide (HYZAAR) 100-25 MG tablet Take 1 tablet by mouth daily.     sildenafil (VIAGRA) 100 MG tablet Take 0.5-1 tablets (50-100 mg total) by mouth daily as needed for erectile dysfunction. (Patient not taking: Reported on 03/06/2021) 5 tablet 0   vitamin B-12 (CYANOCOBALAMIN) 1000 MCG tablet Take 1 tablet (1,000 mcg total) by mouth daily. (Patient not taking: Reported on 03/06/2021) 60 tablet 6   ARIPiprazole (ABILIFY) 5 MG tablet Take 5 mg by mouth daily. (Patient not taking: Reported on 03/06/2021)     budesonide-formoterol (SYMBICORT) 80-4.5 MCG/ACT inhaler Inhale 2 puffs into the lungs in the morning and at bedtime. (Patient not taking: Reported on 03/06/2021) 1 each 3   cyclobenzaprine (FLEXERIL) 10 MG tablet Take 1 tablet by mouth at  bedtime. (Patient not taking: Reported on 03/06/2021)     diclofenac (VOLTAREN) 75 MG EC tablet Take 75 mg by mouth 2 (two) times daily. (Patient not taking: Reported on 03/06/2021)     DULoxetine (CYMBALTA) 60 MG capsule Take 1 capsule (60 mg total) by mouth daily. For depression (Patient not taking: Reported on 03/06/2021) 30 capsule 3   finasteride (PROSCAR) 5 MG tablet Take 5 mg by mouth daily. (Patient not taking: Reported on 03/06/2021)     gabapentin (NEURONTIN) 300 MG capsule TAKE 1 CAPSULE BY MOUTH THREE TIMES DAILY 90 capsule 0   hydrOXYzine (ATARAX/VISTARIL) 25 MG tablet Take 1 tablet (25 mg total) by mouth every 6 (six) hours as needed for anxiety. For anxiety (Patient not taking: Reported on 03/06/2021) 90 tablet 1   lisinopril (ZESTRIL) 20 MG tablet Take 1 tablet (20 mg total) by mouth daily. 90 tablet 3   omeprazole (PRILOSEC) 40 MG capsule Take 1 capsule by mouth daily.     pantoprazole (PROTONIX) 40 MG tablet Take 1 tablet (40 mg total) by mouth daily. (Patient not taking: Reported on 03/06/2021) 60 tablet 1   tamsulosin (FLOMAX) 0.4 MG CAPS capsule Take 2 capsules (0.8 mg total) by mouth daily. (Patient not taking: Reported on 03/06/2021) 90 capsule 3   zinc gluconate 50 MG tablet Take 1 tablet (50 mg total) by mouth daily. 60 tablet 1   No facility-administered medications prior to visit.    Allergies  Allergen Reactions   Tomato     Acid reflux.     ROS Review of Systems    Objective:    Physical Exam Constitutional:      Appearance: Normal appearance.  HENT:     Head: Atraumatic.     Nose: Nose normal.     Mouth/Throat:     Mouth: Mucous membranes are moist.     Pharynx: Oropharynx is clear.  Eyes:     Extraocular Movements: Extraocular movements intact.     Conjunctiva/sclera: Conjunctivae normal.     Pupils: Pupils are equal, round, and reactive to light.  Cardiovascular:     Rate and Rhythm: Normal rate and regular rhythm.     Pulses: Normal pulses.      Heart sounds: Normal heart sounds.  Pulmonary:     Effort: Pulmonary effort is normal.     Breath sounds: Normal breath sounds.  Abdominal:     General: Abdomen is flat. Bowel sounds are normal.     Palpations: Abdomen is soft.  Musculoskeletal:     Cervical back: Normal range of motion and neck supple.     Comments: Tender Right lower back  Skin:  General: Skin is warm and dry.  Neurological:     General: No focal deficit present.     Mental Status: He is alert. Mental status is at baseline.  Psychiatric:     Comments: Anxious.  Not actively suicidal    BP (!) 151/96   Pulse 80   Resp 16   Wt 234 lb 6.4 oz (106.3 kg)   SpO2 98%   BMI 30.93 kg/m  Wt Readings from Last 3 Encounters:  03/06/21 234 lb 6.4 oz (106.3 kg)  09/26/20 243 lb (110.2 kg)  08/20/20 248 lb (112.5 kg)     Health Maintenance Due  Topic Date Due   Pneumococcal Vaccine 470-57 Years old (1 - PCV) Never done   Hepatitis C Screening  Never done   TETANUS/TDAP  Never done   COLONOSCOPY (Pts [phone] Insurance coverage will need to be confirmed)  Never done   Zoster Vaccines- Shingrix (1 of 2) Never done   COVID-19 Vaccine (2 - Booster for Janssen series) 08/22/2020    There are no preventive care reminders to display for this patient.  Lab Results  Component Value Date   TSH 3.077 01/04/2020   Lab Results  Component Value Date   WBC 5.1 09/26/2020   HGB 17.4 09/26/2020   HCT 49.6 09/26/2020   MCV 92 09/26/2020   PLT 263 09/26/2020   Lab Results  Component Value Date   NA 144 09/26/2020   K 3.7 09/26/2020   CO2 26 09/26/2020   GLUCOSE 84 09/26/2020   BUN 11 09/26/2020   CREATININE 0.99 09/26/2020   BILITOT 1.1 09/26/2020   ALKPHOS 70 09/26/2020   AST 14 09/26/2020   ALT 20 09/26/2020   PROT 6.8 09/26/2020   ALBUMIN 4.5 09/26/2020   CALCIUM 8.6 (L) 09/26/2020   ANIONGAP 13 04/12/2020   Lab Results  Component Value Date   CHOL 194 09/26/2020   Lab Results  Component Value Date    HDL 50 09/26/2020   Lab Results  Component Value Date   LDLCALC 125 (H) 09/26/2020   Lab Results  Component Value Date   TRIG 108 09/26/2020   Lab Results  Component Value Date   CHOLHDL 3.9 09/26/2020   Lab Results  Component Value Date   HGBA1C 5.4 01/04/2020      Assessment & Plan:   Problem List Items Addressed This Visit       Cardiovascular and Mediastinum   Essential hypertension    Plan to resume losartan HCT not well controlled out of medication       Relevant Medications   amLODipine (NORVASC) 10 MG tablet   losartan-hydrochlorothiazide (HYZAAR) 100-25 MG tablet     Nervous and Auditory   Chronic right-sided low back pain with right-sided sciatica    Plan to refill cyclobenzaprine, increase gabapentin, avoid opiates, obtain MRI of lumbar spine       Relevant Medications   ARIPiprazole (ABILIFY) 5 MG tablet   cyclobenzaprine (FLEXERIL) 10 MG tablet   diclofenac (VOLTAREN) 75 MG EC tablet   DULoxetine (CYMBALTA) 60 MG capsule   gabapentin (NEURONTIN) 600 MG tablet   hydrOXYzine (ATARAX/VISTARIL) 50 MG tablet     Genitourinary   BPH (benign prostatic hyperplasia)    Increase Flomax 0.8 mg daily and refill Proscar       Relevant Medications   finasteride (PROSCAR) 5 MG tablet   tamsulosin (FLOMAX) 0.4 MG CAPS capsule     Other   Former consumption of alcohol  Currently is abstinent from alcohol       Major depressive disorder, recurrent episode, severe (HCC)    PHQ-9 elevated at 24 patient already has mental health services  Plan to refill Abilify and increase hydroxyzine to 50 mg every 8 as needed  Patient does not have a plan for suicidality and was seen today by licensed clinical social work who agreed with this assessment and he can be safely released and contracts for safety  Patient to follow-up with his ambulatory mental health provider       Relevant Medications   DULoxetine (CYMBALTA) 60 MG capsule   hydrOXYzine  (ATARAX/VISTARIL) 50 MG tablet   Homeless    Currently at a boardinghouse       History of cocaine abuse (HCC)    Patient states he is currently abstinent       Tobacco dependence       Current smoking consumption amount: 4 cigarettes daily  Dicsussion on advise to quit smoking and smoking impacts: Cardiovascular lung impacts  Patient's willingness to quit: Not ready to quit  Methods to quit smoking discussed: Behavioral modification nicotine replacement  Medication management of smoking session drugs discussed: Nicotine replacement  Resources provided:  AVS   Setting quit date not established  Follow-up arranged 3 months  Time spent counseling the patient: 5 minutes         Other Visit Diagnoses     Chronic lumbar radiculopathy    -  Primary   Relevant Medications   ARIPiprazole (ABILIFY) 5 MG tablet   cyclobenzaprine (FLEXERIL) 10 MG tablet   DULoxetine (CYMBALTA) 60 MG capsule   gabapentin (NEURONTIN) 600 MG tablet   hydrOXYzine (ATARAX/VISTARIL) 50 MG tablet   Other Relevant Orders   MR Lumbar Spine Wo Contrast (Completed)       Meds ordered this encounter  Medications   DISCONTD: albuterol (VENTOLIN HFA) 108 (90 Base) MCG/ACT inhaler    Sig: Inhale 1-2 puffs into the lungs every 4 (four) hours as needed for shortness of breath.    Dispense:  8.5 g    Refill:  2   amLODipine (NORVASC) 10 MG tablet    Sig: Take 1 tablet (10 mg total) by mouth daily.    Dispense:  90 tablet    Refill:  1   ARIPiprazole (ABILIFY) 5 MG tablet    Sig: Take 1 tablet (5 mg total) by mouth daily.    Dispense:  60 tablet    Refill:  1   budesonide-formoterol (SYMBICORT) 160-4.5 MCG/ACT inhaler    Sig: Inhale 2 puffs into the lungs in the morning and at bedtime.    Dispense:  10.2 g    Refill:  3   cyclobenzaprine (FLEXERIL) 10 MG tablet    Sig: Take 1 tablet (10 mg total) by mouth 3 (three) times daily as needed for muscle spasms.    Dispense:  60 tablet     Refill:  1   diclofenac (VOLTAREN) 75 MG EC tablet    Sig: Take 1 tablet (75 mg total) by mouth 2 (two) times daily.    Dispense:  60 tablet    Refill:  1   DULoxetine (CYMBALTA) 60 MG capsule    Sig: Take 1 capsule (60 mg total) by mouth daily. For depression    Dispense:  30 capsule    Refill:  3   finasteride (PROSCAR) 5 MG tablet    Sig: Take 1 tablet (5 mg total) by mouth daily.  Dispense:  60 tablet    Refill:  2   gabapentin (NEURONTIN) 600 MG tablet    Sig: Take 2 tablets (1,200 mg total) by mouth 2 (two) times daily.    Dispense:  360 tablet    Refill:  1   hydrOXYzine (ATARAX/VISTARIL) 50 MG tablet    Sig: Take 1 tablet (50 mg total) by mouth every 8 (eight) hours as needed for anxiety. For anxiety    Dispense:  120 tablet    Refill:  1   losartan-hydrochlorothiazide (HYZAAR) 100-25 MG tablet    Sig: Take 1 tablet by mouth daily.    Dispense:  90 tablet    Refill:  2   pantoprazole (PROTONIX) 40 MG tablet    Sig: Take 1 tablet (40 mg total) by mouth daily.    Dispense:  60 tablet    Refill:  1   tamsulosin (FLOMAX) 0.4 MG CAPS capsule    Sig: Take 2 capsules (0.8 mg total) by mouth daily.    Dispense:  90 capsule    Refill:  3    Follow-up: Return in about 3 months (around 06/06/2021).   37 min spent on interview, exam, counseling education med refills coordination of care   Shan Levans, MD
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
## 2021-03-06 NOTE — Assessment & Plan Note (Signed)
PHQ-9 elevated at 24 patient already has mental health services  Plan to refill Abilify and increase hydroxyzine to 50 mg every 8 as needed  Patient does not have a plan for suicidality and was seen today by licensed clinical social work who agreed with this assessment and he can be safely released and contracts for safety  Patient to follow-up with his ambulatory mental health provider

## 2021-03-06 NOTE — Assessment & Plan Note (Signed)
Currently at a boardinghouse

## 2021-03-06 NOTE — Assessment & Plan Note (Addendum)
Increase Flomax 0.8 mg daily and refill Proscar

## 2021-03-07 NOTE — Telephone Encounter (Signed)
No answer, just a busy signal.

## 2021-03-11 ENCOUNTER — Telehealth: Payer: Self-pay | Admitting: *Deleted

## 2021-03-11 MED ORDER — ALBUTEROL SULFATE HFA 108 (90 BASE) MCG/ACT IN AERS: 2.0000 | INHALATION_SPRAY | Freq: Four times a day (QID) | RESPIRATORY_TRACT | 0 refills | Status: DC | PRN

## 2021-03-11 NOTE — Telephone Encounter (Signed)
Albuterol inhaler Rx change sent

## 2021-03-11 NOTE — Addendum Note (Signed)
Addended by: Storm Frisk on: 03/11/2021 06:52 PM   Modules accepted: Orders

## 2021-03-11 NOTE — Telephone Encounter (Signed)
'  Kinnie Scales' pharmacist at [facility] states she had sent several FAX to practice in regards to medication. States order for albuterol sent 03/06/21 is not covered for pt; requesting change to Pro-Air as it is covered.  Please advise: [address]  [phone]

## 2021-03-12 NOTE — Telephone Encounter (Signed)
Called pt unable to reach or leave VM/ °

## 2021-03-20 ENCOUNTER — Other Ambulatory Visit: Payer: Self-pay

## 2021-03-20 ENCOUNTER — Ambulatory Visit (HOSPITAL_COMMUNITY)
Admission: RE | Admit: 2021-03-20 | Discharge: 2021-03-20 | Disposition: A | Discharge status: Home / Self Care | Payer: Medicare Other | Source: Ambulatory Visit | Attending: Critical Care Medicine | Admitting: Critical Care Medicine

## 2021-03-20 DIAGNOSIS — M5416 Radiculopathy, lumbar region: Secondary | ICD-10-CM | POA: Insufficient documentation

## 2021-03-21 ENCOUNTER — Other Ambulatory Visit: Payer: Self-pay | Admitting: Critical Care Medicine

## 2021-03-21 DIAGNOSIS — G894 Chronic pain syndrome: Secondary | ICD-10-CM

## 2021-03-21 DIAGNOSIS — M5441 Lumbago with sciatica, right side: Secondary | ICD-10-CM

## 2021-03-21 DIAGNOSIS — G8929 Other chronic pain: Secondary | ICD-10-CM

## 2021-04-03 ENCOUNTER — Telehealth: Payer: Self-pay | Admitting: Critical Care Medicine

## 2021-04-03 DIAGNOSIS — G8929 Other chronic pain: Secondary | ICD-10-CM

## 2021-04-03 DIAGNOSIS — M5441 Lumbago with sciatica, right side: Secondary | ICD-10-CM

## 2021-04-03 NOTE — Telephone Encounter (Signed)
Copied from CRM [id number]. Topic: Referral - Request for Referral >> Apr 03, 2021 10:43 AM Marylen Ponto wrote: Has patient seen PCP for this complaint? Yes.   *If NO, is insurance requiring patient see PCP for this issue before PCP can refer them? Referral for which specialty: Home Health Preferred provider/office: [facility] Reason for referral: back pain

## 2021-04-03 NOTE — Telephone Encounter (Signed)
Question would this even be neccessary

## 2021-04-04 NOTE — Telephone Encounter (Signed)
He does not need home health.  I can refer him to outpt PT and I am ok with [facility].    Erskine Squibb see if he wishes outpt PT

## 2021-04-07 NOTE — Addendum Note (Signed)
Addended by: Shan Levans E on: 04/07/2021 01:21 PM   Modules accepted: Orders

## 2021-04-07 NOTE — Telephone Encounter (Signed)
Referral for outpt PT SENT

## 2021-04-08 ENCOUNTER — Telehealth: Payer: Self-pay

## 2021-04-08 NOTE — Telephone Encounter (Signed)
Completed [facility] referral faxed to [facility] °

## 2021-04-09 ENCOUNTER — Telehealth: Payer: Self-pay

## 2021-04-09 ENCOUNTER — Encounter: Payer: Self-pay | Admitting: Critical Care Medicine

## 2021-04-09 ENCOUNTER — Ambulatory Visit: Payer: Medicare Other | Attending: Critical Care Medicine | Admitting: Critical Care Medicine

## 2021-04-09 ENCOUNTER — Other Ambulatory Visit: Payer: Self-pay

## 2021-04-09 DIAGNOSIS — I10 Essential (primary) hypertension: Secondary | ICD-10-CM

## 2021-04-09 DIAGNOSIS — M5441 Lumbago with sciatica, right side: Secondary | ICD-10-CM | POA: Diagnosis not present

## 2021-04-09 DIAGNOSIS — G8929 Other chronic pain: Secondary | ICD-10-CM | POA: Diagnosis not present

## 2021-04-09 DIAGNOSIS — Z5900 Homelessness unspecified: Secondary | ICD-10-CM | POA: Diagnosis not present

## 2021-04-09 NOTE — Assessment & Plan Note (Signed)
Hypertension well controlled at this time continue current medications bring the patient in for direct exam in a month

## 2021-04-09 NOTE — Assessment & Plan Note (Signed)
Significant findings on lumbar MRI appointment with orthopedic spine pending patient to continue with pain management clinic

## 2021-04-09 NOTE — Telephone Encounter (Signed)
Call placed to patient to inform him that the referral has been placed for outpatient PT.  He said that he received a call and message but was not given an appointment.   Instructed him to call the therapy department back to schedule the appointment.  Regarding transportation, he said he is registered with [facility] for rides.  Informed him that he can also register with Medicaid and if he needs last minute transportation, a ride can be arranged through [employer].   He said he understood. Instructed him to call to arrange the ride as soon as he has scheduled the appointment.

## 2021-04-09 NOTE — Progress Notes (Signed)
Established Patient Office Visit  Subjective:  Patient ID: Paul Blair, male    DOB: Sep 29, 1965  Age: 55 y.o. MRN: [id number] Virtual Visit via Telephone Note  I connected with Paul Blair on 04/09/21 at  4:00 PM EDT by telephone and verified that I am speaking with the correct person using two identifiers.   Consent:  I discussed the limitations, risks, security and privacy concerns of performing an evaluation and management service by telephone and the availability of in person appointments. I also discussed with the patient that there may be a patient responsible charge related to this service. The patient expressed understanding and agreed to proceed.  Location of patient:  Location of provider:  Persons participating in the televisit with the patient.       History of Present Illness: CC:  PCP f/u OV  HPI 03/06/21 Paul Blair presents for BPH and back pain, hx homelessness Today the patient's primary complaint is that of low back pain and right lower extremity pain as well.  He now is in a boardinghouse and this is relieved some of his stress.  He has chronic anxiety chronic depression.  He is on Abilify 5 mg daily he states the trazodone does not help him sleep and he is off this.  Patient is on hydroxyzine low-dose but he states it is not helping.  His smoking is down to 4 cigarettes a day is no longer using cocaine or alcohol.  On arrival blood pressure is 151/96 however he has not been taking his blood pressure medicine as he ran out.  Patient had a lumbar MRI scheduled but this is yet to be performed he wishes to proceed with this.  He is on the Cymbalta but did not get refills on it.  Patient now has a community access team for mental health called [facility].  He does not member the names of the medicines they are giving and he will bring those with him in the next visit. The patient is out of his current prostate medications.  This is because difficulty with his urine  output.  04/09/2021 This patient is seen in return follow-up by way of a phone visit.  Patient does have an established appointment with orthopedic spine in August 2 he is encouraged to keep this appointment.  He also has a physical therapy referral pending.  He states his blood pressures been excellent on the amlodipine and losartan HCT and it has been recently 137/86.  Patient is now in a pain management clinic and is getting oxycodone per pain management.  Patient has no other real complaints at this time he still has significant low back pain. The patient is no longer using any illicit substances such as cocaine or alcohol  Past Medical History:  Diagnosis Date   Cocaine abuse (HCC)    COVID-19 virus detected 10/18/2019   Formatting of this note might be different from the original. 09/2019 at community testing event   Depression    Depression with suicidal ideation 11/04/2014   History of ETOH abuse    Homicidal ideations    Hypertension    Liver cirrhosis (HCC)    Substance induced mood disorder (HCC)    Substance induced mood disorder (HCC) 11/06/2014   Suicidal ideations     History reviewed. No pertinent surgical history.  History reviewed. No pertinent family history.  Social History   Socioeconomic History   Marital status: Divorced    Spouse name: Not on file   Number of  children: Not on file   Years of education: Not on file   Highest education level: Not on file  Occupational History   Not on file  Tobacco Use   Smoking status: Every Day   Smokeless tobacco: Never  Substance and Sexual Activity   Alcohol use: Yes    Comment: hx abuse- incarcerated at present   Drug use: Yes    Types: Cocaine, Marijuana   Sexual activity: Not on file  Other Topics Concern   Not on file  Social History Narrative   Not on file   Social Determinants of Health   Financial Resource Strain: Not on file  Food Insecurity: Not on file  Transportation Needs: Not on file   Physical Activity: Not on file  Stress: Not on file  Social Connections: Not on file  Intimate Partner Violence: Not on file    Outpatient Medications Prior to Visit  Medication Sig Dispense Refill   albuterol (VENTOLIN HFA) 108 (90 Base) MCG/ACT inhaler Inhale 2 puffs into the lungs every 6 (six) hours as needed for wheezing or shortness of breath. 8 g 0   amLODipine (NORVASC) 10 MG tablet Take 1 tablet (10 mg total) by mouth daily. 90 tablet 1   ARIPiprazole (ABILIFY) 5 MG tablet Take 1 tablet (5 mg total) by mouth daily. 60 tablet 1   budesonide-formoterol (SYMBICORT) 160-4.5 MCG/ACT inhaler Inhale 2 puffs into the lungs in the morning and at bedtime. 10.2 g 3   cyclobenzaprine (FLEXERIL) 10 MG tablet Take 1 tablet (10 mg total) by mouth 3 (three) times daily as needed for muscle spasms. 60 tablet 1   diclofenac (VOLTAREN) 75 MG EC tablet Take 1 tablet (75 mg total) by mouth 2 (two) times daily. 60 tablet 1   finasteride (PROSCAR) 5 MG tablet Take 1 tablet (5 mg total) by mouth daily. 60 tablet 2   hydrOXYzine (ATARAX/VISTARIL) 50 MG tablet Take 1 tablet (50 mg total) by mouth every 8 (eight) hours as needed for anxiety. For anxiety 120 tablet 1   pantoprazole (PROTONIX) 40 MG tablet Take 1 tablet (40 mg total) by mouth daily. 60 tablet 1   sildenafil (VIAGRA) 100 MG tablet Take 0.5-1 tablets (50-100 mg total) by mouth daily as needed for erectile dysfunction. 5 tablet 0   tamsulosin (FLOMAX) 0.4 MG CAPS capsule Take 2 capsules (0.8 mg total) by mouth daily. 90 capsule 3   vitamin B-12 (CYANOCOBALAMIN) 1000 MCG tablet Take 1 tablet (1,000 mcg total) by mouth daily. 60 tablet 6   DULoxetine (CYMBALTA) 60 MG capsule Take 1 capsule (60 mg total) by mouth daily. For depression (Patient not taking: Reported on 04/09/2021) 30 capsule 3   gabapentin (NEURONTIN) 600 MG tablet Take 2 tablets (1,200 mg total) by mouth 2 (two) times daily. (Patient not taking: Reported on 04/09/2021) 360 tablet 1    losartan-hydrochlorothiazide (HYZAAR) 100-25 MG tablet Take 1 tablet by mouth daily. (Patient not taking: Reported on 04/09/2021) 90 tablet 2   oxyCODONE-acetaminophen (PERCOCET/ROXICET) 5-325 MG tablet Take 1 tablet by mouth 4 (four) times daily as needed.     traZODone (DESYREL) 100 MG tablet Take 100 mg by mouth at bedtime.     No facility-administered medications prior to visit.    Allergies  Allergen Reactions   Tomato     Acid reflux.     ROS Review of Systems  HENT: Negative.    Respiratory: Negative.    Cardiovascular: Negative.   Gastrointestinal: Negative.   Musculoskeletal:  Positive  for back pain.     Objective:    No exam this is a phone visit  There were no vitals taken for this visit. Wt Readings from Last 3 Encounters:  03/06/21 234 lb 6.4 oz (106.3 kg)  09/26/20 243 lb (110.2 kg)  08/20/20 248 lb (112.5 kg)     Health Maintenance Due  Topic Date Due   Pneumococcal Vaccine 61-83 Years old (1 - PCV) Never done   Hepatitis C Screening  Never done   TETANUS/TDAP  Never done   COLONOSCOPY (Pts 45-60yrs Insurance coverage will need to be confirmed)  Never done   Zoster Vaccines- Shingrix (1 of 2) Never done   COVID-19 Vaccine (2 - Booster for Janssen series) 08/22/2020    There are no preventive care reminders to display for this patient.  Lab Results  Component Value Date   TSH 3.077 01/04/2020   Lab Results  Component Value Date   WBC 5.1 09/26/2020   HGB 17.4 09/26/2020   HCT 49.6 09/26/2020   MCV 92 09/26/2020   PLT 263 09/26/2020   Lab Results  Component Value Date   NA 144 09/26/2020   K 3.7 09/26/2020   CO2 26 09/26/2020   GLUCOSE 84 09/26/2020   BUN 11 09/26/2020   CREATININE 0.99 09/26/2020   BILITOT 1.1 09/26/2020   ALKPHOS 70 09/26/2020   AST 14 09/26/2020   ALT 20 09/26/2020   PROT 6.8 09/26/2020   ALBUMIN 4.5 09/26/2020   CALCIUM 8.6 (L) 09/26/2020   ANIONGAP 13 04/12/2020   Lab Results  Component Value Date   CHOL 194  09/26/2020   Lab Results  Component Value Date   HDL 50 09/26/2020   Lab Results  Component Value Date   LDLCALC 125 (H) 09/26/2020   Lab Results  Component Value Date   TRIG 108 09/26/2020   Lab Results  Component Value Date   CHOLHDL 3.9 09/26/2020   Lab Results  Component Value Date   HGBA1C 5.4 01/04/2020   03/21/21 Lumbar MRI  IMPRESSION: 1. 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 This is favored to be degenerative rather than indicative of septic facet arthritis although clinical and laboratory correlation are recommended if there is clinical concern for infection. 2. Moderate neural foraminal stenosis bilaterally at L3-4 and on the right at L4-5. 3. No significant spinal stenosis.     Assessment & Plan:   Problem List Items Addressed This Visit       Cardiovascular and Mediastinum   Essential hypertension    Hypertension well controlled at this time continue current medications bring the patient in for direct exam in a month         Nervous and Auditory   Chronic right-sided low back pain with right-sided sciatica    Significant findings on lumbar MRI appointment with orthopedic spine pending patient to continue with pain management clinic       Relevant Medications   oxyCODONE-acetaminophen (PERCOCET/ROXICET) 5-325 MG tablet   traZODone (DESYREL) 100 MG tablet     Other   Homeless    Patient states he is now on a boardinghouse no longer homeless as far as being at the shelter       No orders of the defined types were placed in this encounter.   Follow-up: Return in about 1 month (around 05/10/2021).     Follow Up Instructions: Patient knows a direct exam will  occur toward the end of August   I discussed the assessment and treatment plan with the patient. The patient was provided an opportunity to ask questions and all were answered. The patient agreed with the  plan and demonstrated an understanding of the instructions.   The patient was advised to call back or seek an in-person evaluation if the symptoms worsen or if the condition fails to improve as anticipated.  I provided 28 minutes of non-face-to-face time during this encounter  including  median intraservice time , review of notes, labs, imaging, medications  and explaining diagnosis and management to the patient .       Shan Levans, MD

## 2021-04-09 NOTE — Assessment & Plan Note (Signed)
Patient states he is now on a boardinghouse no longer homeless as far as being at the shelter

## 2021-04-15 ENCOUNTER — Other Ambulatory Visit: Payer: Self-pay | Admitting: Critical Care Medicine

## 2021-04-15 DIAGNOSIS — N529 Male erectile dysfunction, unspecified: Secondary | ICD-10-CM

## 2021-04-15 MED ORDER — ARIPIPRAZOLE 5 MG PO TABS: 5.0000 mg | ORAL_TABLET | Freq: Every day | ORAL | 1 refills | Status: DC

## 2021-04-15 MED ORDER — GABAPENTIN 600 MG PO TABS: 1200.0000 mg | ORAL_TABLET | Freq: Two times a day (BID) | ORAL | 1 refills | Status: DC

## 2021-04-15 MED ORDER — AMLODIPINE BESYLATE 10 MG PO TABS: 10.0000 mg | ORAL_TABLET | Freq: Every day | ORAL | 1 refills | Status: DC

## 2021-04-15 MED ORDER — PANTOPRAZOLE SODIUM 40 MG PO TBEC: 40.0000 mg | DELAYED_RELEASE_TABLET | Freq: Every day | ORAL | 1 refills | Status: DC

## 2021-04-15 MED ORDER — CYCLOBENZAPRINE HCL 10 MG PO TABS: 10.0000 mg | ORAL_TABLET | Freq: Three times a day (TID) | ORAL | 1 refills | Status: DC | PRN

## 2021-04-15 MED ORDER — TRAZODONE HCL 100 MG PO TABS: 100.0000 mg | ORAL_TABLET | Freq: Every day | ORAL | 3 refills | Status: DC

## 2021-04-15 MED ORDER — HYDROXYZINE HCL 50 MG PO TABS: 50.0000 mg | ORAL_TABLET | Freq: Three times a day (TID) | ORAL | 1 refills | Status: DC | PRN

## 2021-04-15 MED ORDER — ALBUTEROL SULFATE HFA 108 (90 BASE) MCG/ACT IN AERS
2.0000 | INHALATION_SPRAY | Freq: Four times a day (QID) | RESPIRATORY_TRACT | 0 refills | Status: DC | PRN
Start: 2021-04-15 — End: 2021-09-19

## 2021-04-15 MED ORDER — DULOXETINE HCL 60 MG PO CPEP: 60.0000 mg | ORAL_CAPSULE | Freq: Every day | ORAL | 3 refills | Status: AC

## 2021-04-15 MED ORDER — LOSARTAN POTASSIUM-HCTZ 100-25 MG PO TABS: 1.0000 | ORAL_TABLET | Freq: Every day | ORAL | 2 refills | Status: AC

## 2021-04-15 MED ORDER — TAMSULOSIN HCL 0.4 MG PO CAPS: 0.8000 mg | ORAL_CAPSULE | Freq: Every day | ORAL | 3 refills | Status: AC

## 2021-04-15 MED ORDER — BUDESONIDE-FORMOTEROL FUMARATE 160-4.5 MCG/ACT IN AERO: 2.0000 | INHALATION_SPRAY | Freq: Two times a day (BID) | RESPIRATORY_TRACT | 3 refills | Status: AC

## 2021-04-15 MED ORDER — FINASTERIDE 5 MG PO TABS: 5.0000 mg | ORAL_TABLET | Freq: Every day | ORAL | 2 refills | Status: AC

## 2021-04-15 MED ORDER — DICLOFENAC SODIUM 75 MG PO TBEC: 75.0000 mg | DELAYED_RELEASE_TABLET | Freq: Two times a day (BID) | ORAL | 1 refills | Status: DC

## 2021-04-15 MED ORDER — SILDENAFIL CITRATE 100 MG PO TABS: 50.0000 mg | ORAL_TABLET | Freq: Every day | ORAL | 0 refills | Status: DC | PRN

## 2021-04-15 NOTE — Progress Notes (Signed)
Med refills

## 2021-04-16 ENCOUNTER — Ambulatory Visit: Payer: Medicare Other | Admitting: Orthopaedic Surgery

## 2021-04-22 ENCOUNTER — Telehealth: Payer: Self-pay | Admitting: Critical Care Medicine

## 2021-04-22 NOTE — Telephone Encounter (Signed)
Copied from CRM [id number]. Topic: General - Inquiry >> Apr 22, 2021 10:14 AM Daphine Deutscher D wrote: Reason for CRM: Pt called saying he is in a lot pain.  He wants to know if he can have a referral to the pain clinic on [address].  He also states he is out of the Percocet . CB#  [phone]

## 2021-04-22 NOTE — Telephone Encounter (Signed)
Pt is calling is to request referral to [facility] Pt he is out of percocet. CB- [phone]

## 2021-04-23 NOTE — Telephone Encounter (Signed)
Can this referral be re opened

## 2021-04-24 NOTE — Telephone Encounter (Signed)
Paul Blair can this referral be changed to [facility] location.

## 2021-04-25 ENCOUNTER — Telehealth: Payer: Self-pay | Admitting: Critical Care Medicine

## 2021-04-25 NOTE — Telephone Encounter (Signed)
Copied from CRM [id number]. Topic: General - Other >> Apr 24, 2021 12:54 PM Wyonia Hough E wrote: Reason for CRM: FYI/ Pt wanted to let Dr. Delford Field know that he got in touch with [facility] and [facility] / that appt is scheduled for 8.22.22 at 2:30pm/ please advise

## 2021-04-25 NOTE — Telephone Encounter (Signed)
Copied from CRM [id number]. Topic: Referral - Request for Referral >> Apr 22, 2021  3:57 PM Gaetana Michaelis A wrote: Has patient seen PCP for this complaint? Yes.    *If NO, is insurance requiring patient see PCP for this issue before PCP can refer them?  Referral for which specialty: Pain Management   Preferred provider/office: [facility] [address]  Reason for referral: Patient is experiencing back pain >> Apr 24, 2021 12:57 PM Wyonia Hough E wrote: Pt called to check on status of referral request / please advise when referral has been placed / pt stated he needs refill on meds and needs referral asap

## 2021-04-26 NOTE — Telephone Encounter (Signed)
Patient has made an additional call regarding this referral  The patient has issues with transportation and would like to be seen at this location for convenience   [address], [place], [place] [address]  Please contact further if needed

## 2021-04-28 NOTE — Telephone Encounter (Signed)
Paul Blair, how do we get PT referral changed to [address] location??  Referral was placed previously , can we just call PT and get them to transfer original

## 2021-04-29 NOTE — Telephone Encounter (Signed)
Duplicate message. 

## 2021-04-29 NOTE — Telephone Encounter (Signed)
Patient called in about back pain referral on [address] in [place], said he couldn't reach them. Im not seeing any referral for this location. Please call back

## 2021-04-29 NOTE — Telephone Encounter (Signed)
Patient called in to inquire about the referral since he is not able to get to [facility] asking for a call from Dr Delford Field or nurse Ph#  [phone]

## 2021-05-02 ENCOUNTER — Telehealth: Payer: Self-pay | Admitting: Critical Care Medicine

## 2021-05-02 ENCOUNTER — Ambulatory Visit: Payer: Self-pay | Admitting: *Deleted

## 2021-05-02 NOTE — Telephone Encounter (Signed)
Pt asked to have a call from Dr Delford Field

## 2021-05-02 NOTE — Telephone Encounter (Signed)
Going to see his sponsor today now. Patient feels overwhelmed and with anxiety with his children and his chronic pain for which he has not taken pain medication for 2 weeks. Stated he is unsure how much longer he can handle the stress in his life and that he wants help today.  Patient stated he was with others now and is going to see his sponsor at this time and if his sponsor is not able to help calm his thoughts he wants to come to the office to talk with his physician. At the same time he said he would call the office after talking with the sponsor and if told the ED is the best place for him with these thoughts he would go to the ED at that time. Admitted to having thoughts of harming himself/others if he was not able to get help.  Routing to clinic as high priority for physician to see encounter note and advise the patient further when he calls back. Reason for Disposition  Patient sounds very sick or weak to the triager  Answer Assessment - Initial Assessment Questions 1. MAIN CONCERN: "What happened that made you call today?"   Unable to handle his stressors including pain and his children 2. RISK OF HARM - SUICIDAL IDEATION:  "Do you ever have thoughts of hurting or killing yourself?"  (e.g., yes, no, no but preoccupation with thoughts about death)   - WISH TO BE DEAD:  "Have you wished you were dead or wished you could go to sleep and not wake up?"   - INTENT:  "Have you had any thoughts of hurting or killing yourself?" (e.g., yes, no, N/A) If Yes, ask: "Are you having these thoughts about killing yourself right NOW?"   - PLAN: "Have you thought about how you might do this?" "Do you have a specific plan for how you would do this?" (e.g., gun, knife, overdose, no plan, N/A)   - ACCESS: If yes to PLAN, "Do you have access to ?" (e.g., pills, gun in house, knife in kitchen)Did not speak of plans or weapons when asked.      He admits to thoughts of harming himself and others at times 3. RISK OF  HARM - SUICIDE ATTEMPT: "Have you tried to harm yourself recently?" If Yes, ask: When was this?"       no 4. RISK OF HARM - SUICIDAL BEHAVIOR: "Have you ever done anything, started to do anything, or prepared to do anything to end your life?" (e.g., collected pills, bought a gun, wrote a suicide note, cut yourself, started but changed your mind)     no 5. EVENTS AND STRESSORS: "Has there been any new stress or recent changes in your life?" (e.g., death of loved one, homelessness, negative event, relationship breakup, work)     Issues with his children and his chronic pain 6. FUNCTIONAL IMPAIRMENT: "How have things been going for you overall? Have you had more difficulty than usual doing your normal daily activities?"  (e.g., better, same, worse; self-care, school, work, interactions)     Not good at all 7. SUPPORT: "Who is with you now?" "Who do you live with?" "Do you have family or friends who you can talk to?"      He is with others now. 8. THERAPIST: "Do you have a counselor or therapist? Name?"     Yes, [facility] and a sponsor 9. ALCOHOL USE OR SUBSTANCE USE (DRUG USE): "Do you drink alcohol or use any illegal drugs?"  no 10. OTHER: "Do you have any other physical symptoms right now?" (e.g., fever)       no 11. PREGNANCY or POSTPARTUM: "Is there any chance you are pregnant?" "When was your last menstrual period?" "Were you recently pregnant?" "When did you give birth?"       na  Protocols used: Suicide Concerns-A-AH

## 2021-05-04 ENCOUNTER — Telehealth: Payer: Self-pay | Admitting: Critical Care Medicine

## 2021-05-04 DIAGNOSIS — N529 Male erectile dysfunction, unspecified: Secondary | ICD-10-CM

## 2021-05-04 NOTE — Telephone Encounter (Signed)
Attempted to call pt back  left VM msg.  Asante , can you try to reach out to the patient Monday?   I will try again this weekend.  Prior encounter was closed by [facility],   he has suicidal ideation, I left msg to tell him he must go to ED.

## 2021-05-07 ENCOUNTER — Ambulatory Visit: Payer: Medicare Other | Admitting: Orthopaedic Surgery

## 2021-05-07 MED ORDER — SILDENAFIL CITRATE 100 MG PO TABS: 50.0000 mg | ORAL_TABLET | Freq: Every day | ORAL | 0 refills | Status: DC | PRN

## 2021-05-07 NOTE — Telephone Encounter (Signed)
Spoke with pt and he states that he no longer is experiencing SI/HI. States that he is feeling "well". Mentioned that he would "get help" if SI/Hi arises again. Acknowledges that he would go to the ED if SI arises with plan, means, and intent. I inquired if pt was in therapy but pt was rushing off the phone and stating that he was in the middle of something. He asked for PCP to refill his prescription for Viagra.

## 2021-05-07 NOTE — Addendum Note (Signed)
Addended by: Shan Levans E on: 05/07/2021 12:52 PM   Modules accepted: Orders

## 2021-05-07 NOTE — Telephone Encounter (Signed)
Good to hear,  will send refill viagra

## 2021-05-09 NOTE — Telephone Encounter (Signed)
Pt was called and informed of medication being sent to pharmacy. Pt states that he wants to know why he only got 5 pills. Pt was informed of upcoming appointment.

## 2021-05-09 NOTE — Telephone Encounter (Signed)
Pt calling in regards to this medication. Advised pt that it had been sent to pharmacy. Pt states that he is not sure why the prescription was so low and is requesting to have PCP give him a call. Please advise.

## 2021-05-14 ENCOUNTER — Ambulatory Visit: Payer: Medicare Other | Admitting: Critical Care Medicine

## 2021-05-27 ENCOUNTER — Telehealth: Payer: Self-pay | Admitting: Critical Care Medicine

## 2021-05-27 ENCOUNTER — Telehealth: Payer: Self-pay

## 2021-05-27 NOTE — Telephone Encounter (Signed)
Call placed to [facility], spoke to [facility] who said that the patient was assessed for services 05/06/2021.

## 2021-05-27 NOTE — Telephone Encounter (Signed)
Advise the pt I will fill out form in his presence on the 9/26 OV

## 2021-05-27 NOTE — Telephone Encounter (Signed)
{  Pt has appointment on 06/10/2021 does he need to have a separate appointment for this paperwork.

## 2021-05-27 NOTE — Telephone Encounter (Signed)
Patient dropped off forms to be filled out by Dr. Delford Field. Please advise patient can be reached at [phone]

## 2021-05-28 NOTE — Telephone Encounter (Signed)
Cancel the later appt and add him on as a virtual.   BTW the form he dropped off is for the patient to fill out, not the MD and I will help him in filling it out although an RN could do the same

## 2021-05-28 NOTE — Telephone Encounter (Signed)
Pt called to see if there was any update about his disability paper work/ he stated he can not wait until his appt with DR. Wright/ Please advise if a virtual appt slot can be given   Pt also mentioned the status of a pillow for his back and a shower chair/ he asked what the status is for these items he requested / please advise

## 2021-05-28 NOTE — Telephone Encounter (Signed)
Pt was called and patient states that he needs the paperwork before 06/10/21 appointment.   Can patient be placed in a virtual slot to fill out the paperwork?

## 2021-05-29 ENCOUNTER — Other Ambulatory Visit: Payer: Self-pay

## 2021-05-29 ENCOUNTER — Ambulatory Visit: Payer: Medicare Other | Admitting: Orthopaedic Surgery

## 2021-05-29 ENCOUNTER — Ambulatory Visit: Payer: Medicare Other | Admitting: Physician Assistant

## 2021-05-29 ENCOUNTER — Ambulatory Visit: Payer: Self-pay

## 2021-05-29 VITALS — BP 166/95 | HR 72 | Temp 98.3°F | Resp 18 | Ht 73.0 in | Wt 237.0 lb

## 2021-05-29 DIAGNOSIS — I10 Essential (primary) hypertension: Secondary | ICD-10-CM | POA: Diagnosis not present

## 2021-05-29 DIAGNOSIS — G8929 Other chronic pain: Secondary | ICD-10-CM

## 2021-05-29 DIAGNOSIS — M5441 Lumbago with sciatica, right side: Secondary | ICD-10-CM

## 2021-05-29 MED ORDER — KETOROLAC TROMETHAMINE 60 MG/2ML IM SOLN
60.0000 mg | Freq: Once | INTRAMUSCULAR | Status: AC
  Administered 2021-05-29: 60 mg via INTRAMUSCULAR

## 2021-05-29 NOTE — Progress Notes (Signed)
Established Patient Office Visit  Subjective:  Patient ID: Paul Blair, male    DOB: [birth year]  Age: 55 y.o. MRN: [id number]  CC:  Chief Complaint  Patient presents with   Back Pain     HPI Paul Blair reports that he tripped in his backyard yesterday, states that he caught himself on a tree, but unfortunately did aggravate his chronic low back pain.  Reports that he had not tried anything for relief.  Reports pain radiates down his right leg, denies numbness or tingling  Reports that he does not check his blood pressure at home, does endorse that he has a home health nurse that comes and was present when he tripped.  Reports that he believes he is only taking amlodipine for his hypertension.  Denies hypertensive symptoms today.         Past Medical History:  Diagnosis Date   Cocaine abuse (HCC)    COVID-19 virus detected 10/18/2019   Formatting of this note might be different from the original. 09/2019 at community testing event   Depression    Depression with suicidal ideation 11/04/2014   History of ETOH abuse    Homicidal ideations    Hypertension    Liver cirrhosis (HCC)    Substance induced mood disorder (HCC)    Substance induced mood disorder (HCC) 11/06/2014   Suicidal ideations     History reviewed. No pertinent surgical history.  History reviewed. No pertinent family history.  Social History   Socioeconomic History   Marital status: Divorced    Spouse name: Not on file   Number of children: Not on file   Years of education: Not on file   Highest education level: Not on file  Occupational History   Not on file  Tobacco Use   Smoking status: Every Day   Smokeless tobacco: Never  Substance and Sexual Activity   Alcohol use: Yes    Comment: hx abuse- incarcerated at present   Drug use: Yes    Types: Cocaine, Marijuana   Sexual activity: Not on file  Other Topics Concern   Not on file  Social History Narrative   Not on file   Social Determinants  of Health   Financial Resource Strain: Not on file  Food Insecurity: Not on file  Transportation Needs: Not on file  Physical Activity: Not on file  Stress: Not on file  Social Connections: Not on file  Intimate Partner Violence: Not on file    Outpatient Medications Prior to Visit  Medication Sig Dispense Refill   albuterol (VENTOLIN HFA) 108 (90 Base) MCG/ACT inhaler Inhale 2 puffs into the lungs every 6 (six) hours as needed for wheezing or shortness of breath. 8 g 0   amLODipine (NORVASC) 10 MG tablet Take 1 tablet (10 mg total) by mouth daily. 90 tablet 1   ARIPiprazole (ABILIFY) 5 MG tablet Take 1 tablet (5 mg total) by mouth daily. 60 tablet 1   budesonide-formoterol (SYMBICORT) 160-4.5 MCG/ACT inhaler Inhale 2 puffs into the lungs in the morning and at bedtime. 10.2 g 3   cyclobenzaprine (FLEXERIL) 10 MG tablet Take 1 tablet (10 mg total) by mouth 3 (three) times daily as needed for muscle spasms. 60 tablet 1   diclofenac (VOLTAREN) 75 MG EC tablet Take 1 tablet (75 mg total) by mouth 2 (two) times daily. 60 tablet 1   DULoxetine (CYMBALTA) 60 MG capsule Take 1 capsule (60 mg total) by mouth daily. For depression 30 capsule 3  finasteride (PROSCAR) 5 MG tablet Take 1 tablet (5 mg total) by mouth daily. 60 tablet 2   gabapentin (NEURONTIN) 600 MG tablet Take 2 tablets (1,200 mg total) by mouth 2 (two) times daily. 360 tablet 1   hydrOXYzine (ATARAX/VISTARIL) 50 MG tablet Take 1 tablet (50 mg total) by mouth every 8 (eight) hours as needed for anxiety. For anxiety 120 tablet 1   pantoprazole (PROTONIX) 40 MG tablet Take 1 tablet (40 mg total) by mouth daily. 60 tablet 1   sildenafil (VIAGRA) 100 MG tablet Take 0.5-1 tablets (50-100 mg total) by mouth daily as needed for erectile dysfunction. 5 tablet 0   tamsulosin (FLOMAX) 0.4 MG CAPS capsule Take 2 capsules (0.8 mg total) by mouth daily. 90 capsule 3   traZODone (DESYREL) 100 MG tablet Take 1 tablet (100 mg total) by mouth at  bedtime. 30 tablet 3   vitamin B-12 (CYANOCOBALAMIN) 1000 MCG tablet Take 1 tablet (1,000 mcg total) by mouth daily. 60 tablet 6   losartan-hydrochlorothiazide (HYZAAR) 100-25 MG tablet Take 1 tablet by mouth daily. (Patient not taking: Reported on 05/29/2021) 90 tablet 2   oxyCODONE-acetaminophen (PERCOCET/ROXICET) 5-325 MG tablet Take 1 tablet by mouth 4 (four) times daily as needed.     No facility-administered medications prior to visit.    Allergies  Allergen Reactions   Tomato     Acid reflux.     ROS Review of Systems  Constitutional:  Negative for chills and fever.  HENT: Negative.    Eyes: Negative.   Respiratory:  Negative for shortness of breath.   Cardiovascular:  Negative for chest pain.  Gastrointestinal: Negative.   Endocrine: Negative.   Genitourinary:  Negative for penile pain and testicular pain.  Musculoskeletal:  Positive for back pain.  Skin: Negative.   Allergic/Immunologic: Negative.   Neurological:  Negative for dizziness, speech difficulty and headaches.  Hematological: Negative.   Psychiatric/Behavioral: Negative.       Objective:    Physical Exam Vitals and nursing note reviewed.  Constitutional:      Appearance: Normal appearance.  HENT:     Head: Normocephalic and atraumatic.     Right Ear: External ear normal.     Left Ear: External ear normal.     Nose: Nose normal.     Mouth/Throat:     Mouth: Mucous membranes are moist.     Pharynx: Oropharynx is clear.  Eyes:     Extraocular Movements: Extraocular movements intact.     Conjunctiva/sclera: Conjunctivae normal.     Pupils: Pupils are equal, round, and reactive to light.  Cardiovascular:     Rate and Rhythm: Normal rate and regular rhythm.     Pulses: Normal pulses.     Heart sounds: Normal heart sounds.  Pulmonary:     Effort: Pulmonary effort is normal.     Breath sounds: Normal breath sounds.  Musculoskeletal:     Cervical back: Neck supple. Tenderness present. Decreased range  of motion.     Thoracic back: Tenderness present. No swelling. Decreased range of motion.     Lumbar back: Tenderness present. No swelling. Decreased range of motion.  Skin:    General: Skin is warm and dry.  Neurological:     General: No focal deficit present.     Mental Status: He is alert and oriented to person, place, and time.  Psychiatric:        Mood and Affect: Mood normal.        Behavior: Behavior normal.  Thought Content: Thought content normal.        Judgment: Judgment normal.    BP (!) 166/95 (BP Location: Left Arm, Patient Position: Sitting, Cuff Size: Large)   Pulse 72   Temp 98.3 F (36.8 C) (Oral)   Resp 18   Ht 6\' 1"  (1.854 m)   Wt 237 lb (107.5 kg)   SpO2 97%   BMI 31.27 kg/m  Wt Readings from Last 3 Encounters:  05/29/21 237 lb (107.5 kg)  03/06/21 234 lb 6.4 oz (106.3 kg)  09/26/20 243 lb (110.2 kg)     Health Maintenance Due  Topic Date Due   Pneumococcal Vaccine 81-52 Years old (1 - PCV) Never done   Hepatitis C Screening  Never done   TETANUS/TDAP  Never done   COLONOSCOPY (Pts 45-94yrs Insurance coverage will need to be confirmed)  Never done   Zoster Vaccines- Shingrix (1 of 2) Never done   COVID-19 Vaccine (2 - Booster for 58yr series) 08/22/2020   INFLUENZA VACCINE  Never done    There are no preventive care reminders to display for this patient.  Lab Results  Component Value Date   TSH 3.077 01/04/2020   Lab Results  Component Value Date   WBC 5.1 09/26/2020   HGB 17.4 09/26/2020   HCT 49.6 09/26/2020   MCV 92 09/26/2020   PLT 263 09/26/2020   Lab Results  Component Value Date   NA 144 09/26/2020   K 3.7 09/26/2020   CO2 26 09/26/2020   GLUCOSE 84 09/26/2020   BUN 11 09/26/2020   CREATININE 0.99 09/26/2020   BILITOT 1.1 09/26/2020   ALKPHOS 70 09/26/2020   AST 14 09/26/2020   ALT 20 09/26/2020   PROT 6.8 09/26/2020   ALBUMIN 4.5 09/26/2020   CALCIUM 8.6 (L) 09/26/2020   ANIONGAP 13 04/12/2020   Lab Results   Component Value Date   CHOL 194 09/26/2020   Lab Results  Component Value Date   HDL 50 09/26/2020   Lab Results  Component Value Date   LDLCALC 125 (H) 09/26/2020   Lab Results  Component Value Date   TRIG 108 09/26/2020   Lab Results  Component Value Date   CHOLHDL 3.9 09/26/2020   Lab Results  Component Value Date   HGBA1C 5.4 01/04/2020      Assessment & Plan:   Problem List Items Addressed This Visit       Cardiovascular and Mediastinum   Elevated blood pressure reading in office with diagnosis of hypertension     Nervous and Auditory   Chronic right-sided low back pain with right-sided sciatica - Primary    Meds ordered this encounter  Medications   ketorolac (TORADOL) injection 60 mg  1. Chronic right-sided low back pain with right-sided sciatica Patient education given on supportive care, patient does have Flexeril and diclofenac on medication list and has refills available.  Patient agreeable to pick up his medications.  Red flags given for prompt reevaluation.  Patient is currently under care of orthopedics for chronic back pain.  Next appointment with orthopedics September 27. - ketorolac (TORADOL) injection 60 mg  2. Elevated blood pressure reading in office with diagnosis of hypertension Patient encouraged to complete home blood pressure checks, keep a written log and have available for all office visits.  Patient was given list of current medications that he should be taking, encouraged patient to double check his current hypertensive medications that he is taking and follow-up as appropriate.  Patient has  upcoming appointment with his primary care provider, Dr. Delford Field, on June 03, 2021.  Red flags given for prompt reevaluation.   I have reviewed the patient's medical history (PMH, PSH, Social History, Family History, Medications, and allergies) , and have been updated if relevant. I spent 30 minutes reviewing chart and  face to face time with  patient.    Follow-up: Return if symptoms worsen or fail to improve.    Kasandra Knudsen Mayers, PA-C

## 2021-05-29 NOTE — Patient Instructions (Signed)
You have flexeril and voltaren available at your pharmacy. This will help with your back pain.  Please check your blood pressure at home, keep a written log and have available for your office visit with Dr. Delford Field.    Roney Jaffe, PA-C Physician Assistant [facility] [email or web address]   Chronic Back Pain When back pain lasts longer than 3 months, it is called chronic back pain. The cause of your back pain may not be known. Some common causes include: Wear and tear (degenerative disease) of the bones, ligaments, or disks in your back. Inflammation and stiffness in your back (arthritis). People who have chronic back pain often go through certain periods in which the pain is more intense (flare-ups). Many people can learn to manage the pain with home care. Follow these instructions at home: Pay attention to any changes in your symptoms. Take these actions to help with your pain: Managing pain and stiffness   If directed, apply ice to the painful area. Your health care provider may recommend applying ice during the first 24-48 hours after a flare-up begins. To do this: Put ice in a plastic bag. Place a towel between your skin and the bag. Leave the ice on for 20 minutes, 2-3 times per day. If directed, apply heat to the affected area as often as told by your health care provider. Use the heat source that your health care provider recommends, such as a moist heat pack or a heating pad. Place a towel between your skin and the heat source. Leave the heat on for 20-30 minutes. Remove the heat if your skin turns bright red. This is especially important if you are unable to feel pain, heat, or cold. You may have a greater risk of getting burned. Try soaking in a warm tub. Activity  Avoid bending and other activities that make the problem worse. Maintain a proper position when standing or sitting: When standing, keep your upper back and  neck straight, with your shoulders pulled back. Avoid slouching. When sitting, keep your back straight and relax your shoulders. Do not round your shoulders or pull them backward. Do not sit or stand in one place for long periods of time. Take brief periods of rest throughout the day. This will reduce your pain. Resting in a lying or standing position is usually better than sitting to rest. When you are resting for longer periods, mix in some mild activity or stretching between periods of rest. This will help to prevent stiffness and pain. Get regular exercise. Ask your health care provider what activities are safe for you. Do not lift anything that is heavier than 10 lb (4.5 kg), or the limit that you are told, until your health care provider says that it is safe. Always use proper lifting technique, which includes: Bending your knees. Keeping the load close to your body. Avoiding twisting. Sleep on a firm mattress in a comfortable position. Try lying on your side with your knees slightly bent. If you lie on your back, put a pillow under your knees. Medicines Treatment may include medicines for pain and inflammation taken by mouth or applied to the skin, prescription pain medicine, or muscle relaxants. Take over-the-counter and prescription medicines only as told by your health care provider. Ask your health care provider if the medicine prescribed to you: Requires you to avoid driving or using machinery. Can cause constipation. You may need to take these actions to prevent or treat constipation: Drink enough fluid to keep your  urine pale yellow. Take over-the-counter or prescription medicines. Eat foods that are high in fiber, such as beans, whole grains, and fresh fruits and vegetables. Limit foods that are high in fat and processed sugars, such as fried or sweet foods. General instructions Do not use any products that contain nicotine or tobacco, such as cigarettes, e-cigarettes, and chewing  tobacco. If you need help quitting, ask your health care provider. Keep all follow-up visits as told by your health care provider. This is important. Contact a health care provider if: You have pain that is not relieved with rest or medicine. Your pain gets worse, or you have new pain. You have a high fever. You have rapid weight loss. You have trouble doing your normal activities. Get help right away if: You have weakness or numbness in one or both of your legs or feet. You have trouble controlling your bladder or your bowels. You have severe back pain and have any of the following: Nausea or vomiting. Pain in your abdomen. Shortness of breath or you faint. Summary Chronic back pain is back pain that lasts longer than 3 months. When a flare-up begins, apply ice to the painful area for the first 24-48 hours. Apply a moist heat pad or use a heating pad on the painful area as directed by your health care provider. When you are resting for longer periods, mix in some mild activity or stretching between periods of rest. This will help to prevent stiffness and pain. This information is not intended to replace advice given to you by your health care provider. Make sure you discuss any questions you have with your health care provider. Document Revised: 10/12/2019 Document Reviewed: 10/12/2019 Elsevier Patient Education  2022 [employer].

## 2021-05-29 NOTE — Telephone Encounter (Signed)
Pt has been set up with a telephone visit for 05/03/2021 at 9:00

## 2021-05-29 NOTE — Telephone Encounter (Signed)
Patient returning a call to say he could not make it to the mobile unit today for his back due to no transportation and no money to pay for transportation. Advised rest, heat to the area, ibuprofen for discomfort. Possibly walk-in to [facility] with transportation.

## 2021-05-29 NOTE — Progress Notes (Signed)
Patient reports falling over a piece a metal in the back yard today and aggravating his chronic back pain. Patient has not used flexeril today after fall or voltaren, patient is only taking amlodipine and no losartan-HCTZ. Patient has eaten and taken medication today. Patient reports pain at a 9 today.

## 2021-05-29 NOTE — Telephone Encounter (Signed)
Pt. Reports he fell outside of his home 30 minutes ago. Has abrasions to left arm. Also hurt his low back. Will go to mobile unit today.     Reason for Disposition  [1] MODERATE weakness (i.e., interferes with work, school, normal activities) AND [2] new-onset or worsening  Answer Assessment - Initial Assessment Questions 1. MECHANISM: "How did the fall happen?"     Fell outside 2. DOMESTIC VIOLENCE AND ELDER ABUSE SCREENING: "Did you fall because someone pushed you or tried to hurt you?" If Yes, ask: "Are you safe now?"     No 3. ONSET: "When did the fall happen?" (e.g., minutes, hours, or days ago)     30 minutes ago 4. LOCATION: "What part of the body hit the ground?" (e.g., back, buttocks, head, hips, knees, hands, head, stomach)     Left arm 5. INJURY: "Did you hurt (injure) yourself when you fell?" If Yes, ask: "What did you injure? Tell me more about this?" (e.g., body area; type of injury; pain severity)"     Left arm and back pain 6. PAIN: "Is there any pain?" If Yes, ask: "How bad is the pain?" (e.g., Scale 1-10; or mild,  moderate, severe)   - NONE (0): No pain   - MILD (1-3): Doesn't interfere with normal activities    - MODERATE (4-7): Interferes with normal activities or awakens from sleep    - SEVERE (8-10): Excruciating pain, unable to do any normal activities      9 7. SIZE: For cuts, bruises, or swelling, ask: "How large is it?" (e.g., inches or centimeters)      Abrasion to arm 8. PREGNANCY: "Is there any chance you are pregnant?" "When was your last menstrual period?"     N/a 9. OTHER SYMPTOMS: "Do you have any other symptoms?" (e.g., dizziness, fever, weakness; new onset or worsening).      No 10. CAUSE: "What do you think caused the fall (or falling)?" (e.g., tripped, dizzy spell)       Tripped  Protocols used: Falls and [facility]

## 2021-06-02 NOTE — Progress Notes (Signed)
Established Patient Office Visit  Subjective:  Patient ID: Paul Blair, male    DOB: [birth year]  Age: 55 y.o. MRN: [id number] Virtual Visit via Telephone Note  I connected with Paul Blair on 06/03/21 at  9:00 AM EDT by telephone and verified that I am speaking with the correct person using two identifiers.   Consent:  I discussed the limitations, risks, security and privacy concerns of performing an evaluation and management service by telephone and the availability of in person appointments. I also discussed with the patient that there may be a patient responsible charge related to this service. The patient expressed understanding and agreed to proceed.  Location of patient: Patient's at home  Location of provider: I am in my office  Persons participating in the televisit with the patient.   No one else in the call    History of Present Illness:   CC: SSI form completion  HPI Paul Blair presents for primary care follow-up to fill out his SSI appeal form.  Note this is a form the patient is to fill out but he needed assistance with this so I accomplish this with him over the phone.  Patient has had a recent fall which is aggravated his back.  He went to see the mobile medicine unit receive a Toradol injection states he had minimal relief.  He also states the Neurontin has not been beneficial to him.  He is out of his oxycodone and knows I cannot refill this.  I am willing to give consideration to a short course of tramadol as his next pain clinic appointment is September 27.  The pain clinic had wanted him to go to [facility] however he can only go to [place] and is agreed to the [employer] location we will see the pain specialist on September 27.  Patient did have an MRI in July showing lumbar disc disease with nerve root impingement.  Blood pressure was elevated at his last visit on the mobile medicine unit.  He is compliant with the losartan HCT.  Also is on Cipro currently for  bladder infection he has seen urology for this.  He does have benign prostatic hypertrophy.  Patient followed by his psychiatrist now has Abilify at 10 mg daily.  Also on Cymbalta 60 mg daily.  Patient states he is using his inhalers as well.  Patient is continue to smoke tobacco products at this time. The patient states he is having difficulty with gait and has a nurse coming to see him also has patient care services twice a week he still has housing  Past Medical History:  Diagnosis Date   Cocaine abuse (HCC)    COVID-19 virus detected 10/18/2019   Formatting of this note might be different from the original. 09/2019 at community testing event   Depression    Depression with suicidal ideation 11/04/2014   History of ETOH abuse    Homicidal ideations    Hypertension    Liver cirrhosis (HCC)    Substance induced mood disorder (HCC)    Substance induced mood disorder (HCC) 11/06/2014   Suicidal ideations     History reviewed. No pertinent surgical history.  History reviewed. No pertinent family history.  Social History   Socioeconomic History   Marital status: Divorced    Spouse name: Not on file   Number of children: Not on file   Years of education: Not on file   Highest education level: Not on file  Occupational History  Not on file  Tobacco Use   Smoking status: Every Day   Smokeless tobacco: Never  Substance and Sexual Activity   Alcohol use: Yes    Comment: hx abuse- incarcerated at present   Drug use: Yes    Types: Cocaine, Marijuana   Sexual activity: Not on file  Other Topics Concern   Not on file  Social History Narrative   Not on file   Social Determinants of Health   Financial Resource Strain: Not on file  Food Insecurity: Not on file  Transportation Needs: Not on file  Physical Activity: Not on file  Stress: Not on file  Social Connections: Not on file  Intimate Partner Violence: Not on file    Outpatient Medications Prior to Visit  Medication Sig  Dispense Refill   albuterol (VENTOLIN HFA) 108 (90 Base) MCG/ACT inhaler Inhale 2 puffs into the lungs every 6 (six) hours as needed for wheezing or shortness of breath. 8 g 0   amLODipine (NORVASC) 10 MG tablet Take 1 tablet (10 mg total) by mouth daily. 90 tablet 1   ARIPiprazole (ABILIFY) 10 MG tablet Take 10 mg by mouth daily.     budesonide-formoterol (SYMBICORT) 160-4.5 MCG/ACT inhaler Inhale 2 puffs into the lungs in the morning and at bedtime. 10.2 g 3   ciprofloxacin (CIPRO) 500 MG tablet Take by mouth.     cyclobenzaprine (FLEXERIL) 10 MG tablet Take 1 tablet (10 mg total) by mouth 3 (three) times daily as needed for muscle spasms. 60 tablet 1   diclofenac (VOLTAREN) 75 MG EC tablet Take 1 tablet (75 mg total) by mouth 2 (two) times daily. 60 tablet 1   DULoxetine (CYMBALTA) 60 MG capsule Take 1 capsule (60 mg total) by mouth daily. For depression 30 capsule 3   finasteride (PROSCAR) 5 MG tablet Take 1 tablet (5 mg total) by mouth daily. 60 tablet 2   gabapentin (NEURONTIN) 600 MG tablet Take 2 tablets (1,200 mg total) by mouth 2 (two) times daily. 360 tablet 1   hydrOXYzine (ATARAX/VISTARIL) 50 MG tablet Take 1 tablet (50 mg total) by mouth every 8 (eight) hours as needed for anxiety. For anxiety 120 tablet 1   losartan-hydrochlorothiazide (HYZAAR) 100-25 MG tablet Take 1 tablet by mouth daily. 90 tablet 2   pantoprazole (PROTONIX) 40 MG tablet Take 1 tablet (40 mg total) by mouth daily. 60 tablet 1   sildenafil (VIAGRA) 100 MG tablet Take 0.5-1 tablets (50-100 mg total) by mouth daily as needed for erectile dysfunction. 5 tablet 0   tamsulosin (FLOMAX) 0.4 MG CAPS capsule Take 2 capsules (0.8 mg total) by mouth daily. 90 capsule 3   traZODone (DESYREL) 100 MG tablet Take 1 tablet (100 mg total) by mouth at bedtime. 30 tablet 3   vitamin B-12 (CYANOCOBALAMIN) 1000 MCG tablet Take 1 tablet (1,000 mcg total) by mouth daily. 60 tablet 6   ARIPiprazole (ABILIFY) 5 MG tablet Take 1 tablet (5  mg total) by mouth daily. 60 tablet 1   gabapentin (NEURONTIN) 600 MG tablet Take by mouth.     hydrOXYzine (ATARAX/VISTARIL) 25 MG tablet Take 25 mg by mouth daily.     oxyCODONE-acetaminophen (PERCOCET/ROXICET) 5-325 MG tablet Take 1 tablet by mouth 4 (four) times daily as needed.     No facility-administered medications prior to visit.    Allergies  Allergen Reactions   Tomato     Acid reflux.     ROS Review of Systems  Constitutional:  Negative for chills, diaphoresis  and fever.  HENT:  Negative for congestion, hearing loss, nosebleeds, sore throat and tinnitus.   Eyes:  Negative for photophobia and redness.  Respiratory:  Negative for cough, shortness of breath, wheezing and stridor.   Cardiovascular:  Negative for chest pain, palpitations and leg swelling.  Gastrointestinal:  Negative for abdominal pain, blood in stool, constipation, diarrhea, nausea and vomiting.  Endocrine: Negative for polydipsia.  Genitourinary:  Negative for dysuria, flank pain, frequency, hematuria and urgency.  Musculoskeletal:  Positive for back pain and gait problem. Negative for myalgias and neck pain.  Skin:  Negative for rash.  Allergic/Immunologic: Negative for environmental allergies.  Neurological:  Negative for dizziness, tremors, seizures, weakness and headaches.  Hematological:  Does not bruise/bleed easily.  Psychiatric/Behavioral:  Positive for dysphoric mood. Negative for suicidal ideas. The patient is nervous/anxious.      Objective:    Physical Exam No exam this is a phone visit There were no vitals taken for this visit. Wt Readings from Last 3 Encounters:  05/29/21 237 lb (107.5 kg)  03/06/21 234 lb 6.4 oz (106.3 kg)  09/26/20 243 lb (110.2 kg)     Health Maintenance Due  Topic Date Due   Hepatitis C Screening  Never done   TETANUS/TDAP  Never done   COLONOSCOPY (Pts 45-47yrs Insurance coverage will need to be confirmed)  Never done   Zoster Vaccines- Shingrix (1 of 2)  Never done   COVID-19 Vaccine (2 - Booster for Janssen series) 08/22/2020   INFLUENZA VACCINE  Never done    There are no preventive care reminders to display for this patient.  Lab Results  Component Value Date   TSH 3.077 01/04/2020   Lab Results  Component Value Date   WBC 5.1 09/26/2020   HGB 17.4 09/26/2020   HCT 49.6 09/26/2020   MCV 92 09/26/2020   PLT 263 09/26/2020   Lab Results  Component Value Date   NA 144 09/26/2020   K 3.7 09/26/2020   CO2 26 09/26/2020   GLUCOSE 84 09/26/2020   BUN 11 09/26/2020   CREATININE 0.99 09/26/2020   BILITOT 1.1 09/26/2020   ALKPHOS 70 09/26/2020   AST 14 09/26/2020   ALT 20 09/26/2020   PROT 6.8 09/26/2020   ALBUMIN 4.5 09/26/2020   CALCIUM 8.6 (L) 09/26/2020   ANIONGAP 13 04/12/2020   Lab Results  Component Value Date   CHOL 194 09/26/2020   Lab Results  Component Value Date   HDL 50 09/26/2020   Lab Results  Component Value Date   LDLCALC 125 (H) 09/26/2020   Lab Results  Component Value Date   TRIG 108 09/26/2020   Lab Results  Component Value Date   CHOLHDL 3.9 09/26/2020   Lab Results  Component Value Date   HGBA1C 5.4 01/04/2020      Assessment & Plan:   Problem List Items Addressed This Visit       Nervous and Auditory   Chronic right-sided low back pain with right-sided sciatica    Chronic recurrent right-sided low back pain with right-sided radiation of pain positive MRI  Patient to follow-up with pain management  40 count of tramadol issued today PMD P database checked and no recent prescriptions after middle of July sent      Relevant Medications   ARIPiprazole (ABILIFY) 10 MG tablet   traMADol (ULTRAM) 50 MG tablet     Genitourinary   BPH (benign prostatic hyperplasia)    Patient continue Flomax and finish course of Cipro  Other   Homeless    Patient currently has housing      Tobacco dependence       Current smoking consumption amount: 4 cigarettes  daily  Dicsussion on advise to quit smoking and smoking impacts: Cardiovascular lung impacts  Patient's willingness to quit: Not ready to quit  Methods to quit smoking discussed: Behavioral modification nicotine replacement  Medication management of smoking session drugs discussed: Nicotine replacement  Resources provided:  AVS   Setting quit date not established  Follow-up arranged 3 months  Time spent counseling the patient: 5 minutes         Meds ordered this encounter  Medications   traMADol (ULTRAM) 50 MG tablet    Sig: Take 1 tablet (50 mg total) by mouth every 6 (six) hours as needed for up to 9 days.    Dispense:  40 tablet    Refill:  0    Follow-up: Return in about 6 weeks (around 07/15/2021).   Follow Up Instructions: Patient knows to come pick up his paperwork this afternoon and that the tramadol was sent to his [facility] pharmacy he will return to see me in 2 months   I discussed the assessment and treatment plan with the patient. The patient was provided an opportunity to ask questions and all were answered. The patient agreed with the plan and demonstrated an understanding of the instructions.   The patient was advised to call back or seek an in-person evaluation if the symptoms worsen or if the condition fails to improve as anticipated.  I provided 27 minutes of non-face-to-face time during this encounter  including  median intraservice time , review of notes, labs, imaging, medications  and explaining diagnosis and management to the patient .    Shan Levans, MD

## 2021-06-03 ENCOUNTER — Ambulatory Visit: Payer: Medicare Other | Attending: Critical Care Medicine | Admitting: Critical Care Medicine

## 2021-06-03 ENCOUNTER — Encounter: Payer: Self-pay | Admitting: Critical Care Medicine

## 2021-06-03 ENCOUNTER — Other Ambulatory Visit: Payer: Self-pay

## 2021-06-03 DIAGNOSIS — Z5900 Homelessness unspecified: Secondary | ICD-10-CM

## 2021-06-03 DIAGNOSIS — M5441 Lumbago with sciatica, right side: Secondary | ICD-10-CM

## 2021-06-03 DIAGNOSIS — F17200 Nicotine dependence, unspecified, uncomplicated: Secondary | ICD-10-CM

## 2021-06-03 DIAGNOSIS — G8929 Other chronic pain: Secondary | ICD-10-CM

## 2021-06-03 DIAGNOSIS — N401 Enlarged prostate with lower urinary tract symptoms: Secondary | ICD-10-CM | POA: Diagnosis not present

## 2021-06-03 DIAGNOSIS — R350 Frequency of micturition: Secondary | ICD-10-CM

## 2021-06-03 MED ORDER — TRAMADOL HCL 50 MG PO TABS: 50.0000 mg | ORAL_TABLET | Freq: Four times a day (QID) | ORAL | 0 refills | Status: AC | PRN

## 2021-06-03 NOTE — Assessment & Plan Note (Signed)
Patient currently has housing

## 2021-06-03 NOTE — Assessment & Plan Note (Signed)
  .   Current smoking consumption amount: 4 cigarettes daily  . Dicsussion on advise to quit smoking and smoking impacts: Cardiovascular lung impacts  . Patient's willingness to quit: Not ready to quit  . Methods to quit smoking discussed: Behavioral modification nicotine replacement  . Medication management of smoking session drugs discussed: Nicotine replacement  . Resources provided:  AVS   . Setting quit date not established  . Follow-up arranged 3 months  Time spent counseling the patient: 5 minutes   

## 2021-06-03 NOTE — Assessment & Plan Note (Signed)
Patient continue Flomax and finish course of Cipro

## 2021-06-03 NOTE — Assessment & Plan Note (Signed)
Chronic recurrent right-sided low back pain with right-sided radiation of pain positive MRI  Patient to follow-up with pain management  40 count of tramadol issued today PMD P database checked and no recent prescriptions after middle of July sent

## 2021-06-06 ENCOUNTER — Ambulatory Visit: Payer: Medicare Other | Admitting: Critical Care Medicine

## 2021-06-10 ENCOUNTER — Ambulatory Visit: Payer: Medicare Other | Admitting: Critical Care Medicine

## 2021-06-11 ENCOUNTER — Other Ambulatory Visit: Payer: Self-pay

## 2021-06-11 ENCOUNTER — Encounter: Payer: Self-pay | Admitting: Orthopaedic Surgery

## 2021-06-11 ENCOUNTER — Ambulatory Visit (INDEPENDENT_AMBULATORY_CARE_PROVIDER_SITE_OTHER): Payer: Medicare Other | Admitting: Orthopaedic Surgery

## 2021-06-11 DIAGNOSIS — M47816 Spondylosis without myelopathy or radiculopathy, lumbar region: Secondary | ICD-10-CM | POA: Insufficient documentation

## 2021-06-11 NOTE — Progress Notes (Signed)
Office Visit Note   Patient: Paul Blair           Date of Birth: [birth year]           MRN: [id number] Visit Date: 06/11/2021              Requested by: Storm Frisk, MD [address] [place],  [place] [address] PCP: Storm Frisk, MD   Assessment & Plan: Visit Diagnoses:  1. Facet degeneration of lumbar region     Plan: Patient has some facet degenerative changes L3-4 more prominent at L4-5 slightly worse on the right than the left.  Also some less severe facet degenerative changes L5-S1 but no central or foraminal compression.  He is already on Voltaren.  Would recommend avoiding narcotic medication.  If he does not respond to therapy then consideration for facet injections at L4-5 would be considered.  Recheck 3 months.  Follow-Up Instructions: Return in about 3 months (around 09/10/2021).   Orders:  No orders of the defined types were placed in this encounter.  No orders of the defined types were placed in this encounter.     Procedures: No procedures performed   Clinical Data: No additional findings.   Subjective: Chief Complaint  Patient presents with   Lower Back - Pain    HPI 55 year old male with back pain worse pain on the right than the left.  He states he has some numbness and tingling and cannot walk long distance.  Past history of alcohol abuse his ethanol levels have been clear for the last several years.  Positive for THC and sometimes positive for cocaine.  He has had back pain problems and has had some prescriptions for oxycodone, tramadol With PDMP numbers of 301, 140 for total of 370.  Patient's Not working.  He states he cannot take anything with Tylenol in it.  He has been on higher dose gabapentin.  Also been on Voltaren.  No history of gout. Review of Systems negative for diabetes.  Patient is a smoker most positive THC.  MRI scan showed facet arthropathy lumbar L4-5.   Objective: Vital Signs: BP 90/63   Pulse (!) 105   Ht 6\' 1"   (1.854 m)   Wt 235 lb (106.6 kg)   BMI 31.00 kg/m   Physical Exam Constitutional:      Appearance: He is well-developed.  HENT:     Head: Normocephalic and atraumatic.     Right Ear: External ear normal.     Left Ear: External ear normal.  Eyes:     Pupils: Pupils are equal, round, and reactive to light.  Neck:     Thyroid: No thyromegaly.     Trachea: No tracheal deviation.  Cardiovascular:     Rate and Rhythm: Normal rate.  Pulmonary:     Effort: Pulmonary effort is normal.     Breath sounds: No wheezing.  Abdominal:     General: Bowel sounds are normal.     Palpations: Abdomen is soft.  Musculoskeletal:     Cervical back: Neck supple.  Skin:    General: Skin is warm and dry.     Capillary Refill: Capillary refill takes less than 2 seconds.  Neurological:     Mental Status: He is alert and oriented to person, place, and time.  Psychiatric:        Behavior: Behavior normal.        Thought Content: Thought content normal.        Judgment:  Judgment normal.    Ortho Exam patient can heel and toe up with complaints of toe pain with walking on his toes which she states have problems with numbness.  No isolated motor weakness EHL anterior tib gastrocsoleus.  He complains of pain with straight leg raising 90 degrees both right and left negative popliteal compression test mild sciatic notch tenderness.  He gets from sitting standing comfortably.  Increased discomfort with bending ,twisting.  Posterior tib pulses are strong and bounding symmetrical.  Reflex are symmetrical.  Specialty Comments:  No specialty comments available.  Imaging: No results found.CLINICAL DATA:  Low back and right lower extremity pain.   EXAM: MRI LUMBAR SPINE WITHOUT CONTRAST   TECHNIQUE: Multiplanar, multisequence MR imaging of the lumbar spine was performed. No intravenous contrast was administered.   COMPARISON:  Lumbar spine radiographs 07/14/2014   FINDINGS: Segmentation:  Standard.    Alignment:  Normal.   Vertebrae: No fracture, suspicious marrow lesion, or evidence of discitis. Extensive marrow edema about the right greater than left L4-5 facet joints extending into the pedicles.   Conus medullaris and cauda equina: Conus extends to the L1-2 level. Conus and cauda equina appear normal.   Paraspinal and other soft tissues: Edema in the soft tissues about the right greater than left L4-5 facet joints. No evidence of a drainable fluid collection.   Disc levels:   L1-2: Negative.   L2-3: Minimal disc bulging and mild facet hypertrophy without significant stenosis.   L3-4: Mild disc desiccation and mild disc space narrowing. Disc bulging, endplate spurring, and mild-to-moderate facet and ligamentum flavum hypertrophy result in borderline to mild bilateral lateral recess stenosis and moderate bilateral neural foraminal stenosis without significant spinal stenosis.   L4-5: Disc bulging, endplate spurring, mild ligamentum flavum hypertrophy, and moderate to severe facet arthrosis result in borderline to mild bilateral lateral recess stenosis and moderate right and mild left neural foraminal stenosis without significant spinal stenosis.   L5-S1: Mild disc bulging and mild facet hypertrophy without stenosis.   IMPRESSION: 1. 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 This is favored to be degenerative rather than indicative of septic facet arthritis although clinical and laboratory correlation are recommended if there is clinical concern for infection. 2. Moderate neural foraminal stenosis bilaterally at L3-4 and on the right at L4-5. 3. No significant spinal stenosis.     Electronically Signed   By: Sebastian Ache M.D.   On: 03/21/2021 12:44     PMFS History: Patient Active Problem List   Diagnosis Date Noted   Facet degeneration of lumbar region 06/11/2021   Tobacco  dependence 03/06/2021   BPH (benign prostatic hyperplasia) 09/26/2020   Chronic right-sided low back pain with right-sided sciatica 08/21/2020   Chronic foot pain, right 08/21/2020   Homeless 10/18/2019   Major depressive disorder, recurrent episode, severe (HCC) 08/06/2019   Elevated blood pressure reading in office with diagnosis of hypertension 05/10/2016   Personality disorder (HCC) 05/10/2016   Cannabis dependence (HCC) 05/10/2016   History of cocaine abuse (HCC) 05/10/2016   Former consumption of alcohol 11/06/2014   Past Medical History:  Diagnosis Date   Cocaine abuse (HCC)    COVID-19 virus detected 10/18/2019   Formatting of this note might be different from the original. 09/2019 at community testing event   Depression    Depression with suicidal ideation 11/04/2014   History of ETOH abuse    Homicidal ideations  Hypertension    Liver cirrhosis (HCC)    Substance induced mood disorder (HCC)    Substance induced mood disorder (HCC) 11/06/2014   Suicidal ideations     No family history on file.  No past surgical history on file. Social History   Occupational History   Not on file  Tobacco Use   Smoking status: Every Day   Smokeless tobacco: Never  Substance and Sexual Activity   Alcohol use: Yes    Comment: hx abuse- incarcerated at present   Drug use: Yes    Types: Cocaine, Marijuana   Sexual activity: Not on file

## 2021-06-15 ENCOUNTER — Other Ambulatory Visit: Payer: Self-pay | Admitting: Critical Care Medicine

## 2021-07-01 ENCOUNTER — Ambulatory Visit: Payer: Medicare Other | Attending: Critical Care Medicine | Admitting: Critical Care Medicine

## 2021-07-01 ENCOUNTER — Other Ambulatory Visit: Payer: Self-pay

## 2021-07-01 DIAGNOSIS — Z91199 Patient's noncompliance with other medical treatment and regimen due to unspecified reason: Secondary | ICD-10-CM

## 2021-07-01 NOTE — Progress Notes (Signed)
The pt was a no show 

## 2021-07-10 ENCOUNTER — Ambulatory Visit: Payer: Medicare Other | Admitting: Critical Care Medicine

## 2021-07-11 ENCOUNTER — Other Ambulatory Visit: Payer: Self-pay | Admitting: Critical Care Medicine

## 2021-07-11 NOTE — Telephone Encounter (Signed)
Requested Prescriptions  Pending Prescriptions Disp Refills  . hydrOXYzine (ATARAX/VISTARIL) 50 MG tablet [Pharmacy Med Name: HYDROXYZINE HCL 50MG  TABS (WHITE)] 120 tablet 1    Sig: TAKE 1 TABLET(50 MG) BY MOUTH EVERY 8 HOURS AS NEEDED FOR ANXIETY     Ear, Nose, and Throat:  Antihistamines Passed - 07/11/2021 10:12 AM      Passed - Valid encounter within last 12 months    Recent Outpatient Visits          1 week ago No-show for appointment   [facility], [place]   1 month ago Chronic right-sided low back pain with right-sided sciatica   [facility], MD   3 months ago Essential hypertension   [facility] Storm Frisk, [place]   4 months ago Chronic lumbar radiculopathy   [facility], MD   6 months ago Substance induced mood disorder [facility])   [facility], MD

## 2021-08-02 ENCOUNTER — Other Ambulatory Visit: Payer: Self-pay | Admitting: Critical Care Medicine

## 2021-08-02 NOTE — Telephone Encounter (Signed)
Requested Prescriptions  Pending Prescriptions Disp Refills  . diclofenac (VOLTAREN) 75 MG EC tablet [Pharmacy Med Name: DICLOFENAC SODIUM 75MG  DR TABLETS] 60 tablet 1    Sig: TAKE 1 TABLET(75 MG) BY MOUTH TWICE DAILY     Analgesics:  NSAIDS Passed - 08/02/2021  3:35 AM      Passed - Cr in normal range and within 360 days    Creatinine, Ser  Date Value Ref Range Status  09/26/2020 0.99 0.76 - 1.27 mg/dL Final         Passed - HGB in normal range and within 360 days    Hemoglobin  Date Value Ref Range Status  09/26/2020 17.4 13.0 - 17.7 g/dL Final         Passed - Patient is not pregnant      Passed - Valid encounter within last 12 months    Recent Outpatient Visits          1 month ago No-show for appointment   [facility], MD   2 months ago Chronic right-sided low back pain with right-sided sciatica   [facility], MD   3 months ago Essential hypertension   [facility] Storm Frisk, MD   4 months ago Chronic lumbar radiculopathy   [facility], MD   7 months ago Substance induced mood disorder [facility])   [facility], MD

## 2021-09-14 ENCOUNTER — Other Ambulatory Visit: Payer: Self-pay | Admitting: Critical Care Medicine

## 2021-09-14 NOTE — Telephone Encounter (Signed)
Requested medication (s) are due for refill today: NO, dose inconsistent with current med list  Requested medication (s) are on the active medication list: NO  Last refill:  07/16/21  Future visit scheduled: NO, NO SHOW 07/01/21, canceled 07/10/21  Notes to clinic:  Not delegated, and dose inconsistent with current med list, please assess.  Requested Prescriptions  Pending Prescriptions Disp Refills   ARIPiprazole (ABILIFY) 5 MG tablet [Pharmacy Med Name: ARIPIPRAZOLE 5MG  TABLETS] 60 tablet 1    Sig: TAKE 1 TABLET(5 MG) BY MOUTH DAILY     Not Delegated - Psychiatry:  Antipsychotics - Second Generation (Atypical) - aripiprazole Failed - 09/14/2021  3:34 AM      Failed - This refill cannot be delegated      Passed - Valid encounter within last 6 months    Recent Outpatient Visits           2 months ago No-show for appointment   [facility], MD   3 months ago Chronic right-sided low back pain with right-sided sciatica   [facility], MD   5 months ago Essential hypertension   [facility] Storm Frisk, MD   6 months ago Chronic lumbar radiculopathy   [facility], MD   9 months ago Substance induced mood disorder [facility])   [facility], MD

## 2021-09-15 DIAGNOSIS — K4090 Unilateral inguinal hernia, without obstruction or gangrene, not specified as recurrent: Secondary | ICD-10-CM

## 2021-09-15 DIAGNOSIS — N400 Enlarged prostate without lower urinary tract symptoms: Secondary | ICD-10-CM

## 2021-09-15 HISTORY — DX: Benign prostatic hyperplasia without lower urinary tract symptoms: N40.0

## 2021-09-15 HISTORY — DX: Unilateral inguinal hernia, without obstruction or gangrene, not specified as recurrent: K40.90

## 2021-09-17 ENCOUNTER — Other Ambulatory Visit: Payer: Self-pay | Admitting: Critical Care Medicine

## 2021-09-17 DIAGNOSIS — N529 Male erectile dysfunction, unspecified: Secondary | ICD-10-CM

## 2021-09-19 ENCOUNTER — Other Ambulatory Visit: Payer: Self-pay | Admitting: Pharmacist

## 2021-09-19 MED ORDER — ALBUTEROL SULFATE HFA 108 (90 BASE) MCG/ACT IN AERS: 2.0000 | INHALATION_SPRAY | Freq: Four times a day (QID) | RESPIRATORY_TRACT | 0 refills | Status: DC | PRN

## 2021-10-23 ENCOUNTER — Ambulatory Visit: Payer: Self-pay | Admitting: *Deleted

## 2021-10-23 ENCOUNTER — Other Ambulatory Visit: Payer: Self-pay | Admitting: Critical Care Medicine

## 2021-10-23 NOTE — Telephone Encounter (Signed)
°  Chief Complaint: hernia Symptoms: increased pain and size, abdominal pain, vomiting, possible fever Frequency: 3 months- getting worse Pertinent Negatives:  Disposition: [x] ED /[] Urgent Care (no appt availability in office) / [] Appointment(In office/virtual)/ []  [facility]/ [] Home Care/ [] Refused Recommended Disposition /[] [facility]/ []  Follow-up with PCP Additional Notes:

## 2021-10-23 NOTE — Telephone Encounter (Signed)
Patient requesting short supply of amlodipine 10 mg until refill comes in sent to  also requesting tamsulosin (FLOMAX) 0.4 MG CAPS capsule [id number] - [place], [place] - [address] AT [facility] OF [facility] [address] Phone:  [phone]  Fax:  [phone]

## 2021-10-23 NOTE — Telephone Encounter (Signed)
Reason for Disposition  Hernia is painful or tender to touch  Answer Assessment - Initial Assessment Questions 1. ONSET:  "When did this first appear?"     3 months 2. APPEARANCE: "What does it look like?"     Increased in size 3. SIZE: "How big is it?" (inches, cm or compare to coins, fruit)     Golfball size to melon size 4. LOCATION: "Where exactly is the hernia located?"     Lower abdomen above penis- R 5. PATTERN: "Does the swelling come and go, or has it been constant since it started?"     Same size 6. PAIN: "Is there any pain?" If Yes, ask: "How bad is it?"  (Scale 1-10; or mild, moderate, severe)     8 7. DIAGNOSIS: "Have you been seen by a doctor (or NP/PA) for this?" "Did the doctor diagnose you as having a hernia?"     yes 8. OTHER SYMPTOMS: "Do you have any other symptoms?" (e.g., fever, abdominal pain, vomiting)     Fever, vomiting 9. PREGNANCY: "Is there any chance you are pregnant?" "When was your last menstrual period?"     *No Answer*  Protocols used: Hernia-A-AH

## 2021-10-24 NOTE — Telephone Encounter (Signed)
Requesting too soon. Requested Prescriptions  Pending Prescriptions Disp Refills   amLODipine (NORVASC) 10 MG tablet [Pharmacy Med Name: amLODIPine Besylate 10 MG Oral Tablet] 90 tablet 0    Sig: Take 1 tablet by mouth once daily     Cardiovascular: Calcium Channel Blockers 2 Passed - 10/23/2021  3:11 PM      Passed - Last BP in normal range    BP Readings from Last 1 Encounters:  06/11/21 90/63         Passed - Last Heart Rate in normal range    Pulse Readings from Last 1 Encounters:  06/11/21 (!) 105         Passed - Valid encounter within last 6 months    Recent Outpatient Visits          3 months ago No-show for appointment   [place] Elsie Stain, MD   4 months ago Chronic right-sided low back pain with right-sided sciatica   [place], Patrick E, MD   6 months ago Essential hypertension   [place] Elsie Stain, MD   7 months ago Chronic lumbar radiculopathy   [place], Patrick E, MD   10 months ago Substance induced mood disorder [facility])   [place] Elsie Stain, MD

## 2021-10-25 NOTE — Telephone Encounter (Signed)
fyi

## 2021-11-21 ENCOUNTER — Other Ambulatory Visit: Payer: Self-pay

## 2021-11-21 ENCOUNTER — Encounter (HOSPITAL_COMMUNITY): Payer: Self-pay | Admitting: Emergency Medicine

## 2021-11-21 ENCOUNTER — Other Ambulatory Visit: Payer: Self-pay | Admitting: Nurse Practitioner

## 2021-11-21 ENCOUNTER — Ambulatory Visit (HOSPITAL_COMMUNITY)
Admission: EM | Admit: 2021-11-21 | Discharge: 2021-11-21 | Disposition: A | Discharge status: Home / Self Care | Payer: Medicare Other | Attending: Family Medicine | Admitting: Family Medicine

## 2021-11-21 DIAGNOSIS — N3000 Acute cystitis without hematuria: Secondary | ICD-10-CM | POA: Diagnosis present

## 2021-11-21 DIAGNOSIS — J309 Allergic rhinitis, unspecified: Secondary | ICD-10-CM | POA: Insufficient documentation

## 2021-11-21 LAB — POCT URINALYSIS DIPSTICK, ED / UC
Bilirubin Urine: NEGATIVE
Glucose, UA: NEGATIVE mg/dL
Ketones, ur: NEGATIVE mg/dL
Nitrite: NEGATIVE
Protein, ur: 30 mg/dL — AB
Specific Gravity, Urine: 1.015 (ref 1.005–1.030)
Urobilinogen, UA: 2 mg/dL — ABNORMAL HIGH (ref 0.0–1.0)
pH: 7 (ref 5.0–8.0)

## 2021-11-21 MED ORDER — SULFAMETHOXAZOLE-TRIMETHOPRIM 800-160 MG PO TABS: 1.0000 | ORAL_TABLET | Freq: Two times a day (BID) | ORAL | 0 refills | Status: AC

## 2021-11-21 MED ORDER — FLUTICASONE PROPIONATE 50 MCG/ACT NA SUSP: 2.0000 | Freq: Every day | NASAL | 0 refills | Status: DC

## 2021-11-21 MED ORDER — CETIRIZINE HCL 10 MG PO CAPS: 10.0000 mg | ORAL_CAPSULE | Freq: Every day | ORAL | 0 refills | Status: DC

## 2021-11-21 NOTE — ED Provider Notes (Signed)
[facility]    CSN: [id number] Arrival date & time: 11/21/21  0807      History   Chief Complaint Chief Complaint  Patient presents with   Dysuria    HPI Paul Blair is a 56 y.o. male.   The patient is a 56 year old male who presents for complaints of dysuria for 7 days.  Patient states that he had been sick with an upper respiratory infection and he did not drink a lot of water during that time.  He complains of frequency, urgency, and suprapubic pressure.  He denies fever, chills, abdominal pain, nausea, vomiting, hematuria, dribbling, or difficulty emptying his bladder.  He did have a urinary tract infection in September, 2022.  He states he was given a 14-day course of the medication, but did not take all the medication at that time because he was feeling better.  He was also told to follow-up with general surgery due to a right inguinal hernia.  He has not done so at this time.  He is not concerned for STI, and denies STI testing at this time.  Patient also complains of sinus symptoms.  Does state that he had an upper respiratory infection within the past 7 to 14 days.  He is not sure if it was COVID, but he continues to have some nasal congestion and runny nose.  He states that he is starting to feel better at this time.  He denies fever, headache, sinus pain or pressure, or GI symptoms.  He does admit to a history of seasonal allergies.   Dysuria Presenting symptoms: dysuria   Presenting symptoms: no penile discharge and no penile pain   Ineffective treatments:  None tried Associated symptoms: urinary frequency and urinary hesitation   Associated symptoms: no diarrhea, no fever, no flank pain, no hematuria, no nausea, no penile swelling, no scrotal swelling and no vomiting   Risk factors: no kidney stones and no STI exposure    Past Medical History:  Diagnosis Date   Cocaine abuse (HCC)    COVID-19 virus detected 10/18/2019   Formatting of this note might be  different from the original. 09/2019 at community testing event   Depression    Depression with suicidal ideation 11/04/2014   History of ETOH abuse    Homicidal ideations    Hypertension    Liver cirrhosis (HCC)    Substance induced mood disorder (HCC)    Substance induced mood disorder (HCC) 11/06/2014   Suicidal ideations     Patient Active Problem List   Diagnosis Date Noted   Facet degeneration of lumbar region 06/11/2021   Tobacco dependence 03/06/2021   BPH (benign prostatic hyperplasia) 09/26/2020   Chronic right-sided low back pain with right-sided sciatica 08/21/2020   Chronic foot pain, right 08/21/2020   Homeless 10/18/2019   Major depressive disorder, recurrent episode, severe (HCC) 08/06/2019   Elevated blood pressure reading in office with diagnosis of hypertension 05/10/2016   Personality disorder (HCC) 05/10/2016   Cannabis dependence (HCC) 05/10/2016   History of cocaine abuse (HCC) 05/10/2016   Former consumption of alcohol 11/06/2014    History reviewed. No pertinent surgical history.     Home Medications    Prior to Admission medications   Medication Sig Start Date End Date Taking? Authorizing Provider  amLODipine (NORVASC) 10 MG tablet Take 1 tablet (10 mg total) by mouth daily. 04/15/21  Yes Storm Frisk, MD  tamsulosin (FLOMAX) 0.4 MG CAPS capsule Take 2 capsules (0.8 mg total) by  mouth daily. 04/15/21  Yes Storm FriskWright, Patrick E, MD  albuterol (PROAIR HFA) 108 (90 Base) MCG/ACT inhaler Inhale 2 puffs into the lungs every 6 (six) hours as needed for wheezing or shortness of breath. 09/19/21   Storm FriskWright, Patrick E, MD  ARIPiprazole (ABILIFY) 5 MG tablet TAKE 1 TABLET(5 MG) BY MOUTH DAILY 09/15/21   Storm FriskWright, Patrick E, MD  budesonide-formoterol [facility](SYMBICORT) 160-4.5 MCG/ACT inhaler Inhale 2 puffs into the lungs in the morning and at bedtime. 04/15/21 04/15/22  Storm FriskWright, Patrick E, MD  cyclobenzaprine (FLEXERIL) 10 MG tablet Take 1 tablet (10 mg total) by mouth 3 (three) times  daily as needed for muscle spasms. 04/15/21   Storm FriskWright, Patrick E, MD  diclofenac (VOLTAREN) 75 MG EC tablet TAKE 1 TABLET(75 MG) BY MOUTH TWICE DAILY 08/02/21   Storm FriskWright, Patrick E, MD  DULoxetine (CYMBALTA) 60 MG capsule Take 1 capsule (60 mg total) by mouth daily. For depression 04/15/21   Storm FriskWright, Patrick E, MD  finasteride (PROSCAR) 5 MG tablet Take 1 tablet (5 mg total) by mouth daily. 04/15/21   Storm FriskWright, Patrick E, MD  gabapentin (NEURONTIN) 600 MG tablet Take 2 tablets (1,200 mg total) by mouth 2 (two) times daily. 04/15/21 07/14/21  Storm FriskWright, Patrick E, MD  hydrOXYzine (ATARAX/VISTARIL) 50 MG tablet TAKE 1 TABLET(50 MG) BY MOUTH EVERY 8 HOURS AS NEEDED FOR ANXIETY 07/11/21   Storm FriskWright, Patrick E, MD  losartan-hydrochlorothiazide (HYZAAR) 100-25 MG tablet Take 1 tablet by mouth daily. 04/15/21 07/14/21  Storm FriskWright, Patrick E, MD  pantoprazole (PROTONIX) 40 MG tablet TAKE 1 TABLET(40 MG) BY MOUTH DAILY 06/15/21   Storm FriskWright, Patrick E, MD  sildenafil (VIAGRA) 100 MG tablet TAKE 1/2 TO 1 (ONE-HALF TO ONE) TABLET BY MOUTH ONCE DAILY AS NEEDED FOR ERECTILE DYSFUNCTION 09/18/21   Storm FriskWright, Patrick E, MD  traZODone (DESYREL) 100 MG tablet Take 1 tablet (100 mg total) by mouth at bedtime. 04/15/21   Storm FriskWright, Patrick E, MD  vitamin B-12 (CYANOCOBALAMIN) 1000 MCG tablet Take 1 tablet (1,000 mcg total) by mouth daily. 09/26/20   Storm FriskWright, Patrick E, MD    Family History No family history on file.  Social History Social History   Tobacco Use   Smoking status: Every Day   Smokeless tobacco: Never  Substance Use Topics   Alcohol use: Yes    Comment: hx abuse- incarcerated at present   Drug use: Yes    Types: Cocaine, Marijuana     Allergies   Tomato   Review of Systems Review of Systems  Constitutional:  Positive for activity change and appetite change. Negative for fatigue and fever.  HENT:  Positive for congestion. Negative for ear pain, sinus pressure and sinus pain.   Eyes: Negative.   Respiratory: Negative.     Cardiovascular: Negative.   Gastrointestinal:  Negative for diarrhea, nausea and vomiting.  Genitourinary:  Positive for dysuria, frequency, hesitancy and urgency. Negative for flank pain, hematuria, penile discharge, penile pain, penile swelling, scrotal swelling and testicular pain.  Skin: Negative.   Neurological: Negative.     Physical Exam Triage Vital Signs ED Triage Vitals  Enc Vitals Group     BP 11/21/21 0833 139/85     Pulse Rate 11/21/21 0831 91     Resp 11/21/21 0831 16     Temp 11/21/21 0831 98 F (36.7 C)     Temp Source 11/21/21 0831 Oral     SpO2 11/21/21 0831 97 %     Weight --      Height --  Head Circumference --      Peak Flow --      Pain Score 11/21/21 0829 4     Pain Loc --      Pain Edu? --      Excl. in GC? --    No data found.  Updated Vital Signs BP 139/85    Pulse 91    Temp 98 F (36.7 C) (Oral)    Resp 16    SpO2 97%   Visual Acuity Right Eye Distance:   Left Eye Distance:   Bilateral Distance:    Right Eye Near:   Left Eye Near:    Bilateral Near:     Physical Exam Vitals reviewed.  Constitutional:      General: He is not in acute distress.    Appearance: Normal appearance.  HENT:     Head: Normocephalic and atraumatic.     Right Ear: Tympanic membrane, ear canal and external ear normal.     Left Ear: Tympanic membrane, ear canal and external ear normal.     Nose: Congestion and rhinorrhea present.     Mouth/Throat:     Mouth: Mucous membranes are moist.     Pharynx: No oropharyngeal exudate or posterior oropharyngeal erythema.  Eyes:     Extraocular Movements: Extraocular movements intact.     Conjunctiva/sclera: Conjunctivae normal.     Pupils: Pupils are equal, round, and reactive to light.  Cardiovascular:     Rate and Rhythm: Normal rate and regular rhythm.     Pulses: Normal pulses.     Heart sounds: Normal heart sounds.  Pulmonary:     Effort: Pulmonary effort is normal.     Breath sounds: Normal breath  sounds.  Abdominal:     General: Bowel sounds are normal.     Palpations: Abdomen is soft.     Tenderness: There is abdominal tenderness in the suprapubic area. There is no right CVA tenderness, left CVA tenderness, guarding or rebound.     Hernia: A hernia is present. Hernia is present in the right inguinal area.  Musculoskeletal:     Cervical back: Normal range of motion and neck supple.  Lymphadenopathy:     Cervical: No cervical adenopathy.  Skin:    General: Skin is warm and dry.  Neurological:     Mental Status: He is alert and oriented to person, place, and time.  Psychiatric:        Mood and Affect: Mood normal.        Behavior: Behavior normal.     UC Treatments / Results  Labs (all labs ordered are listed, but only abnormal results are displayed) Labs Reviewed  POCT URINALYSIS DIPSTICK, ED / UC - Abnormal; Notable for the following components:      Result Value   Hgb urine dipstick TRACE (*)    Protein, ur 30 (*)    Urobilinogen, UA 2.0 (*)    Leukocytes,Ua MODERATE (*)    All other components within normal limits  URINE CULTURE    EKG   Radiology No results found.  Procedures Procedures (including critical care time)  Medications Ordered in UC Medications - No data to display  Initial Impression / Assessment and Plan / UC Course  I have reviewed the triage vital signs and the nursing notes.  Pertinent labs & imaging results that were available during my care of the patient were reviewed by me and considered in my medical decision making (see chart for details).  Symptoms  are consistent with an urinary tract infection with the presence of dysuria, urinary frequency, urgency, hesitancy, and suprapubic pressure.  There is no concern for pyelonephritis at this time as he does not have fever, chills, or CVA tenderness.  Patient declines STI testing today.  We will go ahead and treat the patient with Bactrim today.  Patient was encouraged to follow-up with  general surgery as previously recommended for his inguinal hernia.  We will also prescribe the patient symptomatic treatment for his upper respiratory symptoms to include cetirizine and Flonase.  Patient was encouraged to increase fluid intake, preferably water, void every 2 hours, and to void approximately 15 to 20 minutes after sexual intercourse.  Patient encouraged to follow-up if he develops fever, chills, flank pain, or worsening urinary symptoms.  Urinary culture will be collected today, patient will be contacted if there needs to be a change in his treatment.  Follow-up if symptoms do not improve. Final Clinical Impressions(s) / UC Diagnoses   Final diagnoses:  None   Discharge Instructions   None    ED Prescriptions   None    PDMP not reviewed this encounter.   Abran Cantor, NP 11/21/21 [phone]

## 2021-11-21 NOTE — ED Triage Notes (Signed)
PT reports dysuria for 7 days. PT taking flomax. ? ?He has had past UTI as well. He is not concerned about STD.  ?

## 2021-11-21 NOTE — Discharge Instructions (Addendum)
Your urinalysis shows that you do have a urinary tract infection.  A urine culture was collected today to ensure you are being treated with the correct antibiotic.  If your results indicate that your medication should be changed, you will be contacted. ?Take medication as prescribed. ?Increase water intake.  You should be voiding every 2 hours. ?If sexually active, you should avoid 15 to 20 minutes after intercourse. ?Recommend following up with general surgery for further evaluation of the right inguinal hernia. ?Follow-up if your urinary symptoms worsen to include fever, chills, low back pain, or worsening urinary symptoms. ? ? ?For your allergy symptoms, take medication as prescribed. ?Avoid being outside for an extended period of time without a mask.  Pollen can trigger your allergy symptoms. ?Increase fluids and get plenty of rest. ?Follow-up if symptoms worsen. ?Follow-up as needed. ?

## 2021-11-23 LAB — URINE CULTURE: Culture: 80000 — AB

## 2021-11-25 ENCOUNTER — Telehealth (HOSPITAL_COMMUNITY): Payer: Self-pay | Admitting: Emergency Medicine

## 2021-11-25 MED ORDER — AMOXICILLIN 500 MG PO CAPS: 500.0000 mg | ORAL_CAPSULE | Freq: Three times a day (TID) | ORAL | 0 refills | Status: AC

## 2022-01-07 ENCOUNTER — Other Ambulatory Visit: Payer: Self-pay | Admitting: Critical Care Medicine

## 2022-01-07 ENCOUNTER — Ambulatory Visit: Payer: Self-pay | Admitting: *Deleted

## 2022-01-07 NOTE — Telephone Encounter (Unsigned)
Requesting Percocet not on current med list.  ?

## 2022-01-07 NOTE — Telephone Encounter (Signed)
Medication Refill - Medication:  ?gabapentin (NEURONTIN) 600 MG tablet ?oxyCODONE-acetaminophen (PERCOCET/ROXICET) 5-325 MG tablet  ?*Also mentioned a muscle relaxer* ? ?Has the patient contacted their pharmacy? Yes.   ?Contact PCP ? ?Preferred Pharmacy (with phone number or street name):  ?[id number] - [place], [place] - [address] AT [facility] [address]  ?Phone:  [phone] ?Fax:  [phone] ? ? ?Has the patient been seen for an appointment in the last year OR does the patient have an upcoming appointment? Yes.  06/26 ? ?Agent: Please be advised that RX refills may take up to 3 business days. We ask that you follow-up with your pharmacy. ?

## 2022-01-07 NOTE — Telephone Encounter (Signed)
?  Chief Complaint: refills ?Symptoms: Back pain ?Frequency:  ?Pertinent Negatives: Patient denies  ?Disposition: [] ED /[] Urgent Care (no appt availability in office) / [] Appointment(In office/virtual)/ []  [place] [facility]/ [] Home Care/ [] Refused Recommended Disposition /[] [employer] [facility]/ []  Follow-up with PCP ?Additional Notes: Pt reluctant to triage regarding request of meds, reported to agent "Severe back pain." Attempted to triage, pt states "He has my chart." Questioning when he last took requested meds "Been a minute."  Would not elaborate. Pt called earlier with request at 1100 this AM. Advised PCP has 48-72 hours to respond. States "I said urgent." States going out of town. Assured pt NT would route for review. ?Sent HP due to pt's angry affect. ?

## 2022-01-07 NOTE — Telephone Encounter (Signed)
Reason for Disposition ? [1] Pharmacy calling with prescription questions AND [2] triager unable to answer question ? ?Answer Assessment - Initial Assessment Questions ?1. DRUG NAME: "What medicine do you need to have refilled?" ?    Oxy,gabapentin,"Muscle relaxer." ?2. REFILLS REMAINING: "How many refills are remaining?" (Note: The label on the medicine or pill bottle will show how many refills are remaining. If there are no refills remaining, then a renewal may be needed.) ? ?Protocols used: Medication Refill and Renewal Call-A-AH ? ?

## 2022-01-08 ENCOUNTER — Ambulatory Visit: Payer: Self-pay

## 2022-01-08 NOTE — Telephone Encounter (Signed)
Needs appt for refill

## 2022-01-08 NOTE — Telephone Encounter (Signed)
Will forward to provider  

## 2022-01-08 NOTE — Telephone Encounter (Signed)
Pt called regarding TE from yesterday refill to know status. Pt states that he did not request (PERCOCET/ROXICET) 5-325 MG tablet. Pt then stated he is confused and make have asked for the wrong thing and wants the nurse to FU and tell him what pain meds he is supposed to be on. FU at [phone]  ? ? ? ?Chief Complaint: Pt. Checking on refill request from yesterday."I need my gabapentin and muscle relaxer, I'm going out of town." In ?Symptoms: n/a ?Frequency: n/a ?Pertinent Negatives: Patient denies symptoms ?Disposition: [] ED /[] Urgent Care (no appt availability in office) / [] Appointment(In office/virtual)/ []  [place] [facility]/ [] Home Care/ [] Refused Recommended Disposition /[] [place] [facility]/ [x]  Follow-up with PCP ?Additional Notes: Instructed pt. Request has been sent to PCP.  ? ?Answer Assessment - Initial Assessment Questions ?1. DRUG NAME: "What medicine do you need to have refilled?" ?    "I need my gabapentin and my muscle relaxer." ?2. REFILLS REMAINING: "How many refills are remaining?" (Note: The label on the medicine or pill bottle will show how many refills are remaining. If there are no refills remaining, then a renewal may be needed.) ?    Pt. States none. ?3. EXPIRATION DATE: "What is the expiration date?" (Note: The label states when the prescription will expire, and thus can no longer be refilled.) ?    Pt. unsure ?4. PRESCRIBING HCP: "Who prescribed it?" Reason: If prescribed by specialist, call should be referred to that group. ?    PCP ?5. SYMPTOMS: "Do you have any symptoms?" ?    N/A ?6. PREGNANCY: "Is there any chance that you are pregnant?" "When was your last menstrual period?" ?    N/A ? ?Protocols used: Medication Refill and Renewal Call-A-AH ? ?

## 2022-01-09 MED ORDER — GABAPENTIN 600 MG PO TABS: 1200.0000 mg | ORAL_TABLET | Freq: Two times a day (BID) | ORAL | 0 refills | Status: AC

## 2022-01-09 NOTE — Telephone Encounter (Signed)
Requested Prescriptions  ?Pending Prescriptions Disp Refills  ?? gabapentin (NEURONTIN) 600 MG tablet 360 tablet 0  ?  Sig: Take 2 tablets (1,200 mg total) by mouth 2 (two) times daily.  ?  ? Neurology: Anticonvulsants - gabapentin Failed - 01/07/2022  4:22 PM  ?  ?  Failed - Cr in normal range and within 360 days  ?  Creatinine, Ser  ?Date Value Ref Range Status  ?09/26/2020 0.99 0.76 - 1.27 mg/dL Final  ?   ?  ?  Passed - Completed PHQ-2 or PHQ-9 in the last 360 days  ?  ?  Passed - Valid encounter within last 12 months  ?  Recent Outpatient Visits   ?      ? 6 months ago No-show for appointment  ? [facility] Storm Frisk, [facility]  ? 7 months ago Chronic right-sided low back pain with right-sided sciatica  ? [facility] Storm Frisk, MD  ? 9 months ago Essential hypertension  ? [facility] Storm Frisk, MD  ? 10 months ago Chronic lumbar radiculopathy  ? [facility] Storm Frisk, MD  ? 1 year ago Substance induced mood disorder [facility])  ? [facility] Storm Frisk, MD  ?  ?  ?Future Appointments   ?        ? In 2 months Storm Frisk, MD [facility]  ?  ? ?  ?  ?  ?? cyclobenzaprine (FLEXERIL) 10 MG tablet 60 tablet 1  ?  Sig: Take 1 tablet (10 mg total) by mouth 3 (three) times daily as needed for muscle spasms.  ?  ? Not Delegated - Analgesics:  Muscle Relaxants Failed - 01/07/2022  4:22 PM  ?  ?  Failed - This refill cannot be delegated  ?  ?  Failed - Valid encounter within last 6 months  ?  Recent Outpatient Visits   ?      ? 6 months ago No-show for appointment  ? [facility] Storm Frisk, MD  ? 7 months ago Chronic right-sided low back pain with right-sided sciatica  ? [facility] Storm Frisk, MD  ? 9 months ago Essential hypertension  ?  [facility] Storm Frisk, MD  ? 10 months ago Chronic lumbar radiculopathy  ? [facility] Storm Frisk, MD  ? 1 year ago Substance induced mood disorder [facility])  ? [facility] Storm Frisk, MD  ?  ?  ?Future Appointments   ?        ? In 2 months Storm Frisk, MD [facility]  ?  ? ?  ?  ?  ? ? ?

## 2022-01-09 NOTE — Telephone Encounter (Signed)
Requested medication (s) are due for refill today: yes ? ?Requested medication (s) are on the active medication list: yes ? ?Last refill:  04/15/21 ? ?Future visit scheduled: yes ? ?Notes to clinic:  Unable to refill per protocol, cannot delegate.  ? ? ?  ?Requested Prescriptions  ?Pending Prescriptions Disp Refills  ? cyclobenzaprine (FLEXERIL) 10 MG tablet 60 tablet 1  ?  Sig: Take 1 tablet (10 mg total) by mouth 3 (three) times daily as needed for muscle spasms.  ?  ? Not Delegated - Analgesics:  Muscle Relaxants Failed - 01/07/2022  4:22 PM  ?  ?  Failed - This refill cannot be delegated  ?  ?  Failed - Valid encounter within last 6 months  ?  Recent Outpatient Visits   ? ?      ? 6 months ago No-show for appointment  ? [facility] Storm Frisk, MD  ? 7 months ago Chronic right-sided low back pain with right-sided sciatica  ? [facility] Storm Frisk, MD  ? 9 months ago Essential hypertension  ? [facility] Storm Frisk, MD  ? 10 months ago Chronic lumbar radiculopathy  ? [facility] Storm Frisk, MD  ? 1 year ago Substance induced mood disorder [facility])  ? [facility] Storm Frisk, MD  ? ?  ?  ?Future Appointments   ? ?        ? In 2 months Storm Frisk, MD [facility]  ? ?  ? ? ?  ?  ?  ?Signed Prescriptions Disp Refills  ? gabapentin (NEURONTIN) 600 MG tablet 360 tablet 0  ?  Sig: Take 2 tablets (1,200 mg total) by mouth 2 (two) times daily.  ?  ? Neurology: Anticonvulsants - gabapentin Failed - 01/07/2022  4:22 PM  ?  ?  Failed - Cr in normal range and within 360 days  ?  Creatinine, Ser  ?Date Value Ref Range Status  ?09/26/2020 0.99 0.76 - 1.27 mg/dL Final  ?  ?  ?  ?  Passed - Completed PHQ-2 or PHQ-9 in the last 360 days  ?  ?  Passed - Valid encounter within last 12 months  ?  Recent Outpatient Visits    ? ?      ? 6 months ago No-show for appointment  ? [facility] Storm Frisk, MD  ? 7 months ago Chronic right-sided low back pain with right-sided sciatica  ? [facility] Storm Frisk, MD  ? 9 months ago Essential hypertension  ? [facility] Storm Frisk, MD  ? 10 months ago Chronic lumbar radiculopathy  ? [facility] Storm Frisk, MD  ? 1 year ago Substance induced mood disorder [facility])  ? [facility] Storm Frisk, MD  ? ?  ?  ?Future Appointments   ? ?        ? In 2 months Storm Frisk, MD [facility]  ? ?  ? ? ?  ?  ?  ? ? ?

## 2022-01-14 ENCOUNTER — Telehealth (HOSPITAL_BASED_OUTPATIENT_CLINIC_OR_DEPARTMENT_OTHER): Payer: Medicare Other | Admitting: Nurse Practitioner

## 2022-01-14 ENCOUNTER — Telehealth: Payer: Self-pay | Admitting: Nurse Practitioner

## 2022-01-14 ENCOUNTER — Encounter: Payer: Self-pay | Admitting: Nurse Practitioner

## 2022-01-14 DIAGNOSIS — G8929 Other chronic pain: Secondary | ICD-10-CM

## 2022-01-14 DIAGNOSIS — M5441 Lumbago with sciatica, right side: Secondary | ICD-10-CM | POA: Diagnosis not present

## 2022-01-14 MED ORDER — TRAMADOL HCL 50 MG PO TABS: 50.0000 mg | ORAL_TABLET | Freq: Two times a day (BID) | ORAL | 0 refills | Status: DC | PRN

## 2022-01-14 MED ORDER — CYCLOBENZAPRINE HCL 10 MG PO TABS: 10.0000 mg | ORAL_TABLET | Freq: Three times a day (TID) | ORAL | 1 refills | Status: AC | PRN

## 2022-01-14 NOTE — Telephone Encounter (Signed)
[phone] is not a working number. ? ?2. No answer. LVM on [phone]  ?

## 2022-01-14 NOTE — Progress Notes (Signed)
Virtual Visit via Telephone Note ? I discussed the limitations, risks, security and privacy concerns of performing an evaluation and management service by telephone and the availability of in person appointments. I also discussed with the patient that there may be a patient responsible charge related to this service. The patient expressed understanding and agreed to proceed.  ? ? ?I connected with Koray Soter on 01/14/22  at   8:30 AM EDT  EDT by telephone and verified that I am speaking with the correct person using two identifiers. ? ?Location of Patient: ?Private Residence ?  ?Location of Provider: ?[employer] and [employer]  ?  ?Persons participating in Telemedicine visit: ?Bertram Denver FNP-BC ?Marlana Salvage  ?  ?History of Present Illness: ?Telemedicine visit for: Medication refills/chronic low back pain ? ?Requesting refill of tramadol and cyclobenzaprine. He has a history of facet degeneration of lumbar region. Will need to follow up with Dr. Ophelia Charter for facet injections. I will courtesy fill his tramadol and he is aware this is a one time prescription until he is seen by his PCP for further recommendations. Due to drug history will likely need UDS if tramadol is to be continued. It was positive for cocaine in 2021. He denies any recent use of illicit substances.  ? ? ? ? ? ?Past Medical History:  ?Diagnosis Date  ? Cocaine abuse (HCC)   ? COVID-19 virus detected 10/18/2019  ? Formatting of this note might be different from the original. 09/2019 at community testing event  ? Depression   ? Depression with suicidal ideation 11/04/2014  ? History of ETOH abuse   ? Homicidal ideations   ? Hypertension   ? Liver cirrhosis (HCC)   ? Substance induced mood disorder (HCC)   ? Substance induced mood disorder (HCC) 11/06/2014  ? Suicidal ideations   ?  ?History reviewed. No pertinent surgical history.  ?History reviewed. No pertinent family history.  ?Social History  ? ?Socioeconomic History  ? Marital  status: Divorced  ?  Spouse name: Not on file  ? Number of children: Not on file  ? Years of education: Not on file  ? Highest education level: Not on file  ?Occupational History  ? Not on file  ?Tobacco Use  ? Smoking status: Every Day  ? Smokeless tobacco: Never  ?Substance and Sexual Activity  ? Alcohol use: Yes  ?  Comment: hx abuse- incarcerated at present  ? Drug use: Yes  ?  Types: Cocaine, Marijuana  ? Sexual activity: Not on file  ?Other Topics Concern  ? Not on file  ?Social History Narrative  ? Not on file  ? ?Social Determinants of Health  ? ?Financial Resource Strain: Not on file  ?Food Insecurity: Not on file  ?Transportation Needs: Not on file  ?Physical Activity: Not on file  ?Stress: Not on file  ?Social Connections: Not on file  ?  ? ?Observations/Objective: ?Awake, alert and oriented x 3 ? ? ?Review of Systems  ?Constitutional:  Negative for fever, malaise/fatigue and weight loss.  ?HENT: Negative.  Negative for nosebleeds.   ?Eyes: Negative.  Negative for blurred vision, double vision and photophobia.  ?Respiratory: Negative.  Negative for cough and shortness of breath.   ?Cardiovascular: Negative.  Negative for chest pain, palpitations and leg swelling.  ?Gastrointestinal: Negative.  Negative for heartburn, nausea and vomiting.  ?Musculoskeletal:  Positive for back pain and myalgias.  ?Neurological: Negative.  Negative for dizziness, focal weakness, seizures and headaches.  ?Psychiatric/Behavioral: Negative.  Negative  for suicidal ideas.    ?Assessment and Plan: ?Diagnoses and all orders for this visit: ? ?Chronic right-sided low back pain with right-sided sciatica ?-     cyclobenzaprine (FLEXERIL) 10 MG tablet; Take 1 tablet (10 mg total) by mouth 3 (three) times daily as needed for muscle spasms. ?-     traMADol (ULTRAM) 50 MG tablet; Take 1 tablet (50 mg total) by mouth every 12 (twelve) hours as needed for severe pain. ?Will need UDS at next office visit ?Courtesy fill of tramadol x1  only ?Needs to follow up with Dr. Ophelia Charter ?  ? ?Follow Up Instructions ?Return if symptoms worsen or fail to improve.  ? ?  ?I discussed the assessment and treatment plan with the patient. The patient was provided an opportunity to ask questions and all were answered. The patient agreed with the plan and demonstrated an understanding of the instructions. ?  ?The patient was advised to call back or seek an in-person evaluation if the symptoms worsen or if the condition fails to improve as anticipated. ? ?I provided 12 minutes of non-face-to-face time during this encounter including median intraservice time, reviewing previous notes, labs, imaging, medications and explaining diagnosis and management. ? ?Claiborne Rigg, FNP-BC  ?

## 2022-01-18 ENCOUNTER — Other Ambulatory Visit: Payer: Self-pay | Admitting: Critical Care Medicine

## 2022-01-20 ENCOUNTER — Ambulatory Visit: Payer: Self-pay

## 2022-01-20 NOTE — Telephone Encounter (Signed)
?  Chief Complaint: hernia  ?Symptoms: R pubic area, hernia size of orange, pain 8/10 constant ?Frequency: 3 months but has gotten larger and worse ?Pertinent Negatives: NA ?Disposition: [x] ED /[] Urgent Care (no appt availability in office) / [] Appointment(In office/virtual)/ []  [place] [facility]/ [] Home Care/ [] Refused Recommended Disposition /[] [place] [facility]/ []  Follow-up with PCP ?Additional Notes: Pt states he was currently out of town but needed to see what he needed to do about the hernia. He states he feels like it is trying to go into the groin and is painful and wont stay in when pressing on it. I advised him to go to ED nearby to be evaluated.  ? ?Reason for Disposition ? [1] Constant abdominal pain AND [2] present > 2 hours  (NO pain or tenderness of hernia) ? ?Answer Assessment - Initial Assessment Questions ?1. ONSET:  "When did this first appear?" ?    3 months ago  ?3. SIZE: "How big is it?" (inches, cm or compare to coins, fruit) ?    Small like golf ball and increased to orange  ?4. LOCATION: "Where exactly is the hernia located?" ?    R pubic area  ?5. PATTERN: "Does the swelling come and go, or has it been constant since it started?" ?    constant ?6. PAIN: "Is there any pain?" If Yes, ask: "How bad is it?"  (Scale 1-10; or mild, moderate, severe) ?    8 ?8. OTHER SYMPTOMS: "Do you have any other symptoms?" (e.g., fever, abdominal pain, vomiting) ?    Urination pain, abdominal pain ? ?Protocols used: Hernia-A-AH ? ?

## 2022-01-20 NOTE — Telephone Encounter (Signed)
Routing to PCP for review.

## 2022-01-21 ENCOUNTER — Telehealth: Payer: Self-pay | Admitting: Critical Care Medicine

## 2022-01-21 DIAGNOSIS — K4091 Unilateral inguinal hernia, without obstruction or gangrene, recurrent: Secondary | ICD-10-CM

## 2022-01-21 NOTE — Telephone Encounter (Signed)
Referral for his hernia  ?

## 2022-01-21 NOTE — Telephone Encounter (Signed)
Patient requesting a referral to a General surgeon in [place] as soon as possible ?

## 2022-01-21 NOTE — Telephone Encounter (Signed)
Called pt and he is aware. 

## 2022-01-21 NOTE — Telephone Encounter (Signed)
Referral sent 

## 2022-01-21 NOTE — Telephone Encounter (Signed)
Pt. Calling to report he has a hernia and is on "my way to the ED. I'm out of town." "I'll call back and let you know what they do."           ? ?Patient called back with a family member stating that due to the issues with his hernia he can hardly walk and will go to the ER as per orders from nurse triage on 01/20/22 but family member kept telling him that since he is out of town the ER will not do anything for him like surgery without a referral. Please advise and call Ph#  [phone] ?

## 2022-01-27 ENCOUNTER — Ambulatory Visit: Payer: Self-pay

## 2022-01-27 NOTE — Telephone Encounter (Signed)
Chief Complaint: hernia getting worse ?Symptoms: R pubic area, hernia size of grapefruit, pain 8/10 constant, difficulty with urination and BM ?Frequency: 3 months but has gotten larger and worse ?Pertinent Negatives: NA ?Disposition: [x] ED /[] Urgent Care (no appt availability in office) / [] Appointment(In office/virtual)/ []  [place] [facility]/ [] Home Care/ [] Refused Recommended Disposition /[] [place] [facility]/ []  Follow-up with PCP ?Additional Notes: Pt called in to see what Dr. could recommend since he is having trouble with having BM and urinating. He states he is taking the tamulosin but it isn' helping. He feels like he is straining too much and the hernia keeps getting bigger. Pt asking for something to help with bowels and also wants to know why his tramadol was decreased. I advised pt ED would best option since hernia is bigger and worse and now affecting B&B. Pt states is funds are limited and unable to just keep going to ED. I advised him from [place], NP note on 01/14/22 that he needed an appt with PCP and pt wanted to see if he can be seen with Dr. sooner than June appt. I advised him Dr. didn't have anything open and I felt like Dr. Delford Field would recommend going to the ED as well to f/up on hernia issues but pt insisted I send him a message first.  ? ?Reason for Disposition ? Hernia is painful or tender to touch ? ?Answer Assessment - Initial Assessment Questions ?1. ONSET:  "When did this first appear?" ?    3 months ago  ?3. SIZE: "How big is it?" (inches, cm or compare to coins, fruit) ?    Small like golf ball and increased to grapefruit  ?4. LOCATION: "Where exactly is the hernia located?" ?    R pubic area  ?5. PATTERN: "Does the swelling come and go, or has it been constant since it started?" ?    constant ?6. PAIN: "Is there any pain?" If Yes, ask: "How bad is it?"  (Scale 1-10; or mild, moderate, severe) ?    8.5 ?8. OTHER SYMPTOMS: "Do you have any other symptoms?"  (e.g., fever, abdominal pain, vomiting) ?    Unable to urinate and difficulty having BM, straining. ? ?Protocols used: Hernia-A-AH ? ?

## 2022-01-27 NOTE — Telephone Encounter (Signed)
I am showing he has seen surgery for hernia and they were to set him up for outpatient surgery he needs to f/u with them ? ?I am willing to see him sooner ok to double book him in sooner ?

## 2022-01-27 NOTE — Telephone Encounter (Addendum)
Patient states he will have surgery in 2-3 weeks. LBM was last night and patient states he is dribbling to void.  Advised patient to call surgeons office to update on condition to see if they would want to move surgery up. Advised there were no catheter supplies in the office to help him with voiding. Recommended he go to ED. He has concerns about paying hospital bil for ED visit and transportation. Advised that he may arrange transportation with his insurance company for appointments with his appointments. ? ?He mentioned pain medication was decreased.  ?Would like to f/u with Dr. Delford Field. Scheduled apt with Dr. Delford Field on Wednesday at 11.  ? ? ?Patient verbalized understanding.  ? ?

## 2022-01-29 ENCOUNTER — Ambulatory Visit: Payer: Medicare Other | Attending: Critical Care Medicine | Admitting: Critical Care Medicine

## 2022-01-29 DIAGNOSIS — R972 Elevated prostate specific antigen [PSA]: Secondary | ICD-10-CM | POA: Insufficient documentation

## 2022-01-29 DIAGNOSIS — K589 Irritable bowel syndrome without diarrhea: Secondary | ICD-10-CM | POA: Insufficient documentation

## 2022-01-29 DIAGNOSIS — K4090 Unilateral inguinal hernia, without obstruction or gangrene, not specified as recurrent: Secondary | ICD-10-CM

## 2022-01-29 DIAGNOSIS — K5900 Constipation, unspecified: Secondary | ICD-10-CM | POA: Insufficient documentation

## 2022-01-29 DIAGNOSIS — I10 Essential (primary) hypertension: Secondary | ICD-10-CM | POA: Insufficient documentation

## 2022-01-29 DIAGNOSIS — L210 Seborrhea capitis: Secondary | ICD-10-CM | POA: Insufficient documentation

## 2022-01-29 DIAGNOSIS — B360 Pityriasis versicolor: Secondary | ICD-10-CM | POA: Insufficient documentation

## 2022-01-29 DIAGNOSIS — F1020 Alcohol dependence, uncomplicated: Secondary | ICD-10-CM | POA: Insufficient documentation

## 2022-01-29 DIAGNOSIS — J45909 Unspecified asthma, uncomplicated: Secondary | ICD-10-CM | POA: Insufficient documentation

## 2022-01-29 DIAGNOSIS — M1990 Unspecified osteoarthritis, unspecified site: Secondary | ICD-10-CM | POA: Insufficient documentation

## 2022-01-29 NOTE — Assessment & Plan Note (Signed)
Right inguinal hernia with now difficulty urinating and having bowel movements ? ?Patient likely will need a urinary catheter placed and may need more urgent surgery he is requested to go to the emergency room at [facility] he understands and will follow through in the emergency room knows he is coming ?

## 2022-01-29 NOTE — Progress Notes (Signed)
? ?  Established Patient Office Visit ? ?Subjective   ?Patient ID: Paul Blair, male    DOB: 1965-10-16  Age: 56 y.o. MRN: [id number] ?Virtual Visit via Telephone Note ? ?I connected with Paul Blair on 01/29/22 at 11:00 AM EDT by telephone and verified that I am speaking with the correct person using two identifiers. ?  ?Consent:  ?I discussed the limitations, risks, security and privacy concerns of performing an evaluation and management service by telephone and the availability of in person appointments. I also discussed with the patient that there may be a patient responsible charge related to this service. The patient expressed understanding and agreed to proceed. ? ?Location of patient: Patient's at home ? ?Location of provider: I am in my office ? ?Persons participating in the televisit with the patient.   ? ?No one else with the patient ? ? ?History of Present Illness: ?This patient has had difficulty with progressive inguinal hernia enlargement and now no longer can have a bowel movement and cannot void.  He is maxed out on his dosage of tamsulosin and Proscar for pre-existing prostatic hypertrophy ? ?He went to the ER several days ago and they were not able to offer any assistance he then called his surgeon on the 15th and he is scheduled for May 25 the earliest he can have his hernia repaired.  The surgeon asked for [place] to intervene on his urination issue may be have a catheter placed.  We do not have that ability to do this in our clinic. ? ?I told the patient he would need to go to the emergency room to have a catheter placed and may need to have his surgery moved up even further ? ?I then called the emergency room at [facility] and spoke to the charge nurse named Juanda Crumble and he knows Mr. Obenhaus is on his way to the emergency room ? ? ?   ?  ?Objective:  ?  ?This is a phone note no physical exam ?There were no vitals taken for this visit. ? ? ?Physical Exam ? ? ?No results found for any visits on  01/29/22. ? ? ? ?The 10-year ASCVD risk score (Arnett DK, et al., 2019) is: 17.7% ? ?  ?Assessment & Plan:  ? ?Problem List Items Addressed This Visit   ? ?  ? Other  ? Right inguinal hernia  ?  Right inguinal hernia with now difficulty urinating and having bowel movements ? ?Patient likely will need a urinary catheter placed and may need more urgent surgery he is requested to go to the emergency room at [facility] he understands and will follow through in the emergency room knows he is coming ? ?  ?  ? ? ?Follow Up Instructions: ?The patient is now going to the emergency room he is understanding my advice ?  ?I discussed the assessment and treatment plan with the patient. The patient was provided an opportunity to ask questions and all were answered. The patient agreed with the plan and demonstrated an understanding of the instructions. ?  ?The patient was advised to call back or seek an in-person evaluation if the symptoms worsen or if the condition fails to improve as anticipated. ? ?I provided 10 minutes of non-face-to-face time during this encounter  including  median intraservice time , review of notes, labs, imaging, medications  and explaining diagnosis and management to the patient .  ? ? ?Asencion Noble, MD ?

## 2022-01-30 ENCOUNTER — Ambulatory Visit: Payer: Self-pay | Admitting: General Surgery

## 2022-02-03 ENCOUNTER — Other Ambulatory Visit: Payer: Self-pay | Admitting: Nurse Practitioner

## 2022-02-03 DIAGNOSIS — G8929 Other chronic pain: Secondary | ICD-10-CM

## 2022-02-04 NOTE — Telephone Encounter (Signed)
Requested medication (s) are due for refill today unsure  Requested medication (s) are on the active medication list -yes  Future visit scheduled -yes  Last refill: 01/14/22 #30  Notes to clinic: non delegated Rx  Requested Prescriptions  Pending Prescriptions Disp Refills   traMADol (ULTRAM) 50 MG tablet [Pharmacy Med Name: TRAMADOL 50MG  TABLETS] 30 tablet     Sig: TAKE 1 TABLET(50 MG) BY MOUTH EVERY 12 HOURS AS NEEDED FOR SEVERE PAIN     Not Delegated - Analgesics:  Opioid Agonists Failed - 02/03/2022  9:44 AM      Failed - This refill cannot be delegated      Failed - Urine Drug Screen completed in last 360 days      Passed - Valid encounter within last 3 months    Recent Outpatient Visits           6 days ago Right inguinal hernia   [place] Elsie Stain, MD   3 weeks ago Chronic right-sided low back pain with right-sided sciatica   [place], Vernia Buff, NP   7 months ago No-show for appointment   [place] Elsie Stain, MD   8 months ago Chronic right-sided low back pain with right-sided sciatica   [place] Elsie Stain, MD   10 months ago Essential hypertension   [place], MD       Future Appointments             In 1 month Joya Gaskins Burnett Harry, MD [place]                Requested Prescriptions  Pending Prescriptions Disp Refills   traMADol (ULTRAM) 50 MG tablet [Pharmacy Med Name: TRAMADOL 50MG  TABLETS] 30 tablet     Sig: TAKE 1 TABLET(50 MG) BY MOUTH EVERY 12 HOURS AS NEEDED FOR SEVERE PAIN     Not Delegated - Analgesics:  Opioid Agonists Failed - 02/03/2022  9:44 AM      Failed - This refill cannot be delegated      Failed - Urine Drug Screen completed in last 360 days      Passed - Valid encounter within last 3 months     Recent Outpatient Visits           6 days ago Right inguinal hernia   [place] Elsie Stain, MD   3 weeks ago Chronic right-sided low back pain with right-sided sciatica   [place] Gildardo Pounds, NP   7 months ago No-show for appointment   [place] Elsie Stain, MD   8 months ago Chronic right-sided low back pain with right-sided sciatica   [place], Patrick E, MD   10 months ago Essential hypertension   [place], MD       Future Appointments             In 1 month Joya Gaskins Burnett Harry, MD [place]

## 2022-02-05 ENCOUNTER — Encounter (HOSPITAL_BASED_OUTPATIENT_CLINIC_OR_DEPARTMENT_OTHER): Payer: Self-pay | Admitting: General Surgery

## 2022-02-05 ENCOUNTER — Other Ambulatory Visit: Payer: Self-pay

## 2022-02-05 NOTE — Progress Notes (Signed)
Spoke w/ via phone for pre-op interview---[facility] needs dos---- ISTAT, EKG, drug screen (ask MDA if he wants blood work, drug screen  and EKG repeated) Patient has a hx of drug use, however he denies recent drug use except for edibles. However during pre-op phone call I was suspicious that he might be high. He had an abnormal EKG from 04/12/20.              Lab results------01/21/22 CBC, CMP, lactic acid results in Epic, 04/12/20 most recent EKG in Epic & chart COVID test -----patient states asymptomatic no test needed Arrive at -------1230 on Thursday, 02/06/22 NPO after MN NO Solid Food.  Clear liquids from MN until---1130 Med rec completed- Not completed. Patient kept interrupting and stated that he only took a few medications. He stated that he was taking Albuterol, Amlodipine, Gabapentin, Flomax, Omeprazole, & Percocet. He did not know the dosages. I was unable to update his med list. I instructed him to bring all medication bottles with him. Medications to take morning of surgery -----Albuterol, Gabapentin, Omeprazole, Flomax (I instructed him not to take any other medications since I was unsure of what else he might be taking.) Diabetic medication -----n/a Patient instructed no nail polish to be worn day of surgery Patient instructed to bring photo id and insurance card day of surgery Patient aware to have Driver (ride ) / caregiver    for 24 hours after surgery - Patient was unsure of who is his driver and caregiver would be. I made him aware that he could not be released without a driver and responsible person to care for him for the first 24 hours. Patient Special Instructions -----Bring inhaler. Bring all medications. Pre-Op special Istructions -----Pt states that due to back pain and sciatica he sometimes need minimal assistance and uses a cane/ walker. Patient verbalized understanding of instructions that were given at this phone interview. Patient denies shortness of breath, chest pain,  fever, cough at this phone interview.

## 2022-02-06 ENCOUNTER — Other Ambulatory Visit: Payer: Self-pay

## 2022-02-06 ENCOUNTER — Ambulatory Visit (HOSPITAL_BASED_OUTPATIENT_CLINIC_OR_DEPARTMENT_OTHER): Payer: Medicare Other | Admitting: Anesthesiology

## 2022-02-06 ENCOUNTER — Ambulatory Visit (HOSPITAL_BASED_OUTPATIENT_CLINIC_OR_DEPARTMENT_OTHER)
Admission: RE | Admit: 2022-02-06 | Discharge: 2022-02-06 | Disposition: A | Discharge status: Home / Self Care | Payer: Medicare Other | Attending: General Surgery | Admitting: General Surgery

## 2022-02-06 ENCOUNTER — Encounter (HOSPITAL_BASED_OUTPATIENT_CLINIC_OR_DEPARTMENT_OTHER)
Admission: RE | Disposition: A | Discharge status: Home / Self Care | Payer: Self-pay | Source: Home / Self Care | Attending: General Surgery

## 2022-02-06 ENCOUNTER — Encounter (HOSPITAL_BASED_OUTPATIENT_CLINIC_OR_DEPARTMENT_OTHER): Payer: Self-pay | Admitting: General Surgery

## 2022-02-06 DIAGNOSIS — J45909 Unspecified asthma, uncomplicated: Secondary | ICD-10-CM | POA: Insufficient documentation

## 2022-02-06 DIAGNOSIS — K4090 Unilateral inguinal hernia, without obstruction or gangrene, not specified as recurrent: Secondary | ICD-10-CM

## 2022-02-06 DIAGNOSIS — F1910 Other psychoactive substance abuse, uncomplicated: Secondary | ICD-10-CM | POA: Diagnosis not present

## 2022-02-06 DIAGNOSIS — F17210 Nicotine dependence, cigarettes, uncomplicated: Secondary | ICD-10-CM | POA: Insufficient documentation

## 2022-02-06 DIAGNOSIS — K7460 Unspecified cirrhosis of liver: Secondary | ICD-10-CM | POA: Diagnosis not present

## 2022-02-06 DIAGNOSIS — M25559 Pain in unspecified hip: Secondary | ICD-10-CM | POA: Diagnosis not present

## 2022-02-06 DIAGNOSIS — F1090 Alcohol use, unspecified, uncomplicated: Secondary | ICD-10-CM | POA: Insufficient documentation

## 2022-02-06 DIAGNOSIS — Z01818 Encounter for other preprocedural examination: Secondary | ICD-10-CM

## 2022-02-06 DIAGNOSIS — I10 Essential (primary) hypertension: Secondary | ICD-10-CM | POA: Insufficient documentation

## 2022-02-06 HISTORY — DX: Dependence on other enabling machines and devices: Z99.89

## 2022-02-06 HISTORY — DX: Bipolar disorder, unspecified: F31.9

## 2022-02-06 HISTORY — DX: Schizoaffective disorder, unspecified: F25.9

## 2022-02-06 HISTORY — DX: Headache, unspecified: R51.9

## 2022-02-06 HISTORY — DX: Other chronic pain: G89.29

## 2022-02-06 HISTORY — PX: INGUINAL HERNIA REPAIR: SHX194

## 2022-02-06 HISTORY — DX: Presence of spectacles and contact lenses: Z97.3

## 2022-02-06 HISTORY — DX: Unspecified osteoarthritis, unspecified site: M19.90

## 2022-02-06 HISTORY — DX: Unspecified asthma, uncomplicated: J45.909

## 2022-02-06 HISTORY — DX: Anxiety disorder, unspecified: F41.9

## 2022-02-06 LAB — POCT I-STAT, CHEM 8
BUN: 6 mg/dL (ref 6–20)
Calcium, Ion: 1.16 mmol/L (ref 1.15–1.40)
Chloride: 105 mmol/L (ref 98–111)
Creatinine, Ser: 0.9 mg/dL (ref 0.61–1.24)
Glucose, Bld: 88 mg/dL (ref 70–99)
HCT: 51 % (ref 39.0–52.0)
Hemoglobin: 17.3 g/dL — ABNORMAL HIGH (ref 13.0–17.0)
Potassium: 3.3 mmol/L — ABNORMAL LOW (ref 3.5–5.1)
Sodium: 146 mmol/L — ABNORMAL HIGH (ref 135–145)
TCO2: 26 mmol/L (ref 22–32)

## 2022-02-06 LAB — RAPID URINE DRUG SCREEN, HOSP PERFORMED
Amphetamines: NOT DETECTED
Barbiturates: NOT DETECTED
Benzodiazepines: NOT DETECTED
Cocaine: POSITIVE — AB
Opiates: NOT DETECTED
Tetrahydrocannabinol: POSITIVE — AB

## 2022-02-06 SURGERY — REPAIR, HERNIA, INGUINAL, ADULT
Anesthesia: General | Site: Inguinal | Laterality: Right

## 2022-02-06 MED ORDER — FENTANYL CITRATE (PF) 100 MCG/2ML IJ SOLN
INTRAMUSCULAR | Status: AC
  Filled 2022-02-06: qty 2

## 2022-02-06 MED ORDER — FENTANYL CITRATE (PF) 100 MCG/2ML IJ SOLN
INTRAMUSCULAR | Status: AC
Start: 2022-02-06 — End: ?
  Filled 2022-02-06: qty 2

## 2022-02-06 MED ORDER — ONDANSETRON HCL 4 MG/2ML IJ SOLN
INTRAMUSCULAR | Status: DC | PRN
Start: 2022-02-06 — End: 2022-02-06
  Administered 2022-02-06: 4 mg via INTRAVENOUS

## 2022-02-06 MED ORDER — CEFAZOLIN SODIUM-DEXTROSE 2-4 GM/100ML-% IV SOLN
INTRAVENOUS | Status: AC
  Filled 2022-02-06: qty 100

## 2022-02-06 MED ORDER — ACETAMINOPHEN 500 MG PO TABS
ORAL_TABLET | ORAL | Status: AC
  Filled 2022-02-06: qty 2

## 2022-02-06 MED ORDER — LABETALOL HCL 5 MG/ML IV SOLN
10.0000 mg | INTRAVENOUS | Status: DC | PRN
  Administered 2022-02-06: 10 mg via INTRAVENOUS

## 2022-02-06 MED ORDER — MIDAZOLAM HCL 5 MG/5ML IJ SOLN
INTRAMUSCULAR | Status: DC | PRN
  Administered 2022-02-06: 2 mg via INTRAVENOUS

## 2022-02-06 MED ORDER — OXYCODONE HCL 5 MG PO TABS
5.0000 mg | ORAL_TABLET | Freq: Once | ORAL | Status: AC | PRN
  Administered 2022-02-06: 5 mg via ORAL

## 2022-02-06 MED ORDER — ONDANSETRON HCL 4 MG/2ML IJ SOLN
INTRAMUSCULAR | Status: AC
  Filled 2022-02-06: qty 2

## 2022-02-06 MED ORDER — PROPOFOL 10 MG/ML IV BOLUS
INTRAVENOUS | Status: DC | PRN
  Administered 2022-02-06: 200 mg via INTRAVENOUS
  Administered 2022-02-06: 100 mg via INTRAVENOUS

## 2022-02-06 MED ORDER — LABETALOL HCL 5 MG/ML IV SOLN
INTRAVENOUS | Status: AC
  Filled 2022-02-06: qty 4

## 2022-02-06 MED ORDER — HYDROMORPHONE HCL 1 MG/ML IJ SOLN: 0.2500 mg | INTRAMUSCULAR | Status: DC | PRN

## 2022-02-06 MED ORDER — FENTANYL CITRATE (PF) 100 MCG/2ML IJ SOLN
100.0000 ug | Freq: Once | INTRAMUSCULAR | Status: AC
  Administered 2022-02-06: 100 ug via INTRAVENOUS

## 2022-02-06 MED ORDER — MIDAZOLAM HCL 2 MG/2ML IJ SOLN
2.0000 mg | Freq: Once | INTRAMUSCULAR | Status: AC
Start: 2022-02-06 — End: 2022-02-06
  Administered 2022-02-06: 2 mg via INTRAVENOUS

## 2022-02-06 MED ORDER — MIDAZOLAM HCL 2 MG/2ML IJ SOLN
INTRAMUSCULAR | Status: AC
  Filled 2022-02-06: qty 2

## 2022-02-06 MED ORDER — DEXAMETHASONE SODIUM PHOSPHATE 10 MG/ML IJ SOLN
INTRAMUSCULAR | Status: AC
  Filled 2022-02-06: qty 1

## 2022-02-06 MED ORDER — FENTANYL CITRATE (PF) 100 MCG/2ML IJ SOLN
25.0000 ug | INTRAMUSCULAR | Status: DC | PRN
  Administered 2022-02-06 (×2): 50 ug via INTRAVENOUS

## 2022-02-06 MED ORDER — LIDOCAINE HCL (PF) 2 % IJ SOLN
INTRAMUSCULAR | Status: AC
  Filled 2022-02-06: qty 5

## 2022-02-06 MED ORDER — CEFAZOLIN SODIUM-DEXTROSE 2-4 GM/100ML-% IV SOLN
2.0000 g | INTRAVENOUS | Status: AC
  Administered 2022-02-06: 2 g via INTRAVENOUS

## 2022-02-06 MED ORDER — FENTANYL CITRATE (PF) 100 MCG/2ML IJ SOLN
INTRAMUSCULAR | Status: DC | PRN
  Administered 2022-02-06 (×6): 50 ug via INTRAVENOUS
  Administered 2022-02-06: 100 ug via INTRAVENOUS

## 2022-02-06 MED ORDER — CELECOXIB 200 MG PO CAPS
400.0000 mg | ORAL_CAPSULE | ORAL | Status: AC
  Administered 2022-02-06: 400 mg via ORAL

## 2022-02-06 MED ORDER — CHLORHEXIDINE GLUCONATE CLOTH 2 % EX PADS: 6.0000 | MEDICATED_PAD | Freq: Once | CUTANEOUS | Status: DC

## 2022-02-06 MED ORDER — LACTATED RINGERS IV SOLN: INTRAVENOUS | Status: DC

## 2022-02-06 MED ORDER — OXYCODONE HCL 5 MG PO TABS: 5.0000 mg | ORAL_TABLET | Freq: Four times a day (QID) | ORAL | 0 refills | Status: AC | PRN

## 2022-02-06 MED ORDER — LIDOCAINE HCL (CARDIAC) PF 100 MG/5ML IV SOSY
PREFILLED_SYRINGE | INTRAVENOUS | Status: DC | PRN
  Administered 2022-02-06: 100 mg via INTRAVENOUS

## 2022-02-06 MED ORDER — DEXAMETHASONE SODIUM PHOSPHATE 10 MG/ML IJ SOLN
INTRAMUSCULAR | Status: DC | PRN
  Administered 2022-02-06: 10 mg via INTRAVENOUS

## 2022-02-06 MED ORDER — AMISULPRIDE (ANTIEMETIC) 5 MG/2ML IV SOLN: 10.0000 mg | Freq: Once | INTRAVENOUS | Status: DC | PRN

## 2022-02-06 MED ORDER — OXYCODONE HCL 5 MG PO TABS
ORAL_TABLET | ORAL | Status: AC
  Filled 2022-02-06: qty 1

## 2022-02-06 MED ORDER — ROPIVACAINE HCL 5 MG/ML IJ SOLN
INTRAMUSCULAR | Status: DC | PRN
  Administered 2022-02-06: 30 mL via PERINEURAL

## 2022-02-06 MED ORDER — ACETAMINOPHEN 10 MG/ML IV SOLN
INTRAVENOUS | Status: AC
  Filled 2022-02-06: qty 100

## 2022-02-06 MED ORDER — ACETAMINOPHEN 500 MG PO TABS: 1000.0000 mg | ORAL_TABLET | ORAL | Status: DC

## 2022-02-06 MED ORDER — OXYCODONE HCL 5 MG/5ML PO SOLN: 5.0000 mg | Freq: Once | ORAL | Status: AC | PRN

## 2022-02-06 MED ORDER — PROPOFOL 10 MG/ML IV BOLUS
INTRAVENOUS | Status: AC
Start: 2022-02-06 — End: ?
  Filled 2022-02-06: qty 20

## 2022-02-06 MED ORDER — CELECOXIB 200 MG PO CAPS
ORAL_CAPSULE | ORAL | Status: AC
  Filled 2022-02-06: qty 2

## 2022-02-06 MED ORDER — ENSURE PRE-SURGERY PO LIQD: 296.0000 mL | Freq: Once | ORAL | Status: DC

## 2022-02-06 MED ORDER — 0.9 % SODIUM CHLORIDE (POUR BTL) OPTIME
TOPICAL | Status: DC | PRN
  Administered 2022-02-06: 500 mL

## 2022-02-06 MED ORDER — ONDANSETRON HCL 4 MG/2ML IJ SOLN: 4.0000 mg | Freq: Once | INTRAMUSCULAR | Status: DC | PRN

## 2022-02-06 SURGICAL SUPPLY — 53 items
ADH SKN CLS APL DERMABOND .7 (GAUZE/BANDAGES/DRESSINGS) ×1
APL PRP STRL LF DISP 70% ISPRP (MISCELLANEOUS) ×1
APL SWBSTK 6 STRL LF DISP (MISCELLANEOUS) ×1
APPLICATOR COTTON TIP 6 STRL (MISCELLANEOUS) IMPLANT
APPLICATOR COTTON TIP 6IN STRL (MISCELLANEOUS) ×2
BLADE CLIPPER SENSICLIP SURGIC (BLADE) ×2 IMPLANT
BLADE SURG 15 STRL LF DISP TIS (BLADE) ×1 IMPLANT
BLADE SURG 15 STRL SS (BLADE) ×2
CHLORAPREP W/TINT 26 (MISCELLANEOUS) ×2 IMPLANT
COVER BACK TABLE 60X90IN (DRAPES) ×2 IMPLANT
COVER MAYO STAND STRL (DRAPES) ×2 IMPLANT
DERMABOND ADVANCED (GAUZE/BANDAGES/DRESSINGS) ×1
DERMABOND ADVANCED .7 DNX12 (GAUZE/BANDAGES/DRESSINGS) ×1 IMPLANT
DRAIN PENROSE 0.25X18 (DRAIN) ×1 IMPLANT
DRAPE LAPAROSCOPIC ABDOMINAL (DRAPES) ×2 IMPLANT
DRAPE UTILITY XL STRL (DRAPES) ×2 IMPLANT
ELECT REM PT RETURN 9FT ADLT (ELECTROSURGICAL) ×2
ELECTRODE REM PT RTRN 9FT ADLT (ELECTROSURGICAL) ×1 IMPLANT
GAUZE 4X4 16PLY ~~LOC~~+RFID DBL (SPONGE) ×2 IMPLANT
GLOVE BIO SURGEON STRL SZ 6 (GLOVE) ×2 IMPLANT
GLOVE BIO SURGEON STRL SZ 6.5 (GLOVE) ×1 IMPLANT
GLOVE BIOGEL PI IND STRL 6 (GLOVE) IMPLANT
GLOVE BIOGEL PI IND STRL 7.0 (GLOVE) ×1 IMPLANT
GLOVE BIOGEL PI IND STRL 7.5 (GLOVE) IMPLANT
GLOVE BIOGEL PI INDICATOR 6 (GLOVE) ×2
GLOVE BIOGEL PI INDICATOR 7.0 (GLOVE) ×2
GLOVE BIOGEL PI INDICATOR 7.5 (GLOVE) ×1
GLOVE SURG SS PI 7.0 STRL IVOR (GLOVE) ×2 IMPLANT
GOWN STRL REUS W/TWL LRG LVL3 (GOWN DISPOSABLE) ×3 IMPLANT
GOWN STRL REUS W/TWL XL LVL3 (GOWN DISPOSABLE) ×1 IMPLANT
KIT TURNOVER CYSTO (KITS) ×2 IMPLANT
MANIFOLD NEPTUNE II (INSTRUMENTS) ×1 IMPLANT
MESH HERNIA 3X6 (Mesh General) ×1 IMPLANT
NDL HYPO 25X1 1.5 SAFETY (NEEDLE) ×1 IMPLANT
NEEDLE HYPO 25X1 1.5 SAFETY (NEEDLE) ×2 IMPLANT
NS IRRIG 500ML POUR BTL (IV SOLUTION) ×2 IMPLANT
PACK BASIN DAY SURGERY FS (CUSTOM PROCEDURE TRAY) ×2 IMPLANT
PAD ARMBOARD 7.5X6 YLW CONV (MISCELLANEOUS) ×2 IMPLANT
PENCIL SMOKE EVACUATOR (MISCELLANEOUS) ×2 IMPLANT
SUT MNCRL AB 4-0 PS2 18 (SUTURE) ×2 IMPLANT
SUT PROLENE 2 0 CT2 30 (SUTURE) ×4 IMPLANT
SUT VIC AB 2-0 SH 18 (SUTURE) ×2 IMPLANT
SUT VIC AB 2-0 SH 27 (SUTURE) ×2
SUT VIC AB 2-0 SH 27XBRD (SUTURE) ×1 IMPLANT
SUT VIC AB 3-0 SH 27 (SUTURE) ×2
SUT VIC AB 3-0 SH 27X BRD (SUTURE) ×1 IMPLANT
SUT VICRYL 2 0 18  UND BR (SUTURE) ×2
SUT VICRYL 2 0 18 UND BR (SUTURE) ×1 IMPLANT
SYR BULB IRRIG 60ML STRL (SYRINGE) ×2 IMPLANT
SYR CONTROL 10ML LL (SYRINGE) ×2 IMPLANT
TOWEL OR 17X26 10 PK STRL BLUE (TOWEL DISPOSABLE) ×2 IMPLANT
TUBE CONNECTING 12X1/4 (SUCTIONS) ×2 IMPLANT
YANKAUER SUCT BULB TIP NO VENT (SUCTIONS) ×2 IMPLANT

## 2022-02-06 NOTE — Op Note (Signed)
Preop diagnosis: right inguinal hernia  Postop diagnosis: right indirect inguinal hernia  Procedure: open Right inguinal hernia repair with mesh  Surgeon: Gurney Maxin, M.D.  Asst: none  Anesthesia: Gen.   Indications for procedure: Paul Blair is a 56 y.o. male with symptoms of pain and enlarging Right inguinal hernia(s). After discussing risks, alternatives and benefits he decided on open repair and was brought to day surgery for repair.  Description of procedure: The patient was brought into the operative suite, placed supine. Anesthesia was administered with endotracheal tube. Patient was strapped in place. The patient was prepped and draped in the usual sterile fashion.  The anterior superior iliac spine and pubic tubercle were identified on the Right side. An incision was made 1cm above the connecting line, representative of the location of the inguinal ligament. The subcutaneous tissue was bluntly dissected, scarpa's fascia was dissected away. The external abdominal oblique fascia was identified and sharply opened down to the external inguinal ring. The conjoint tendon and inguinal ligament were identified. The cord structures and sac were dissected free of the surrounding tissue in 360 degrees. A penrose drain was used to encircle the contents. The cremasteric fibers were dissected free of the contents of the cord and hernia sac. The cord structures (vessels and vas deferens) were identified and carefully dissected away from the hernia sac. The hernia sac contained colon and was opened and contents reduced into the peritoneal space.The hernia sac was dissected down to the internal inguinal ring. Preperitoneal fat was identified showing appropriate dissection. The sac was then reduced into the preperitoneal space. The hernia was indirect. A 3x6 Bard mesh was then used to close the defect and reinforce the floor. The mesh was sutured to the lacunar ligament and inguinal ligament using a 2-0  prolene in running fashion. Next the superior edge of the mesh was sutured to the conjoined tendon using a 2-0 running Prolene. An additional 2-0 Prolene was used to suture the tail ends of the mesh together re-creating the deep ring. Cord structures are running in a neutral position through the mesh. Next the external abdominal oblique fascia was closed with a 3-0 Vicryl in interrupted fashion to re-create the external inguinal ring. Scarpa's fascia was closed with 3-0 Vicryl in running fashion. Skin was closed with a 4-0 Monocryl subcuticular stitch in running fashion. Dermabond place for dressing. Patient woke from anesthesia and brought to PACU in stable condition. All counts are correct.    Findings: indirect large right inguinal hernia  Specimen: none  Blood loss: 20 ml  Local anesthesia: preoperative TAP block  Complications: none  Implant: 3 x 6 Bard mesh  Gurney Maxin, M.D. General, Bariatric, & Minimally Invasive Surgery [facility], [place] 3:22 PM 02/06/2022

## 2022-02-06 NOTE — Transfer of Care (Signed)
Immediate Anesthesia Transfer of Care Note  Patient: Paul Blair  Procedure(s) Performed: Procedure(s) (LRB): OPEN RIGHT INGUINAL HERNIA REPAIR WITH MESH (Right)  Patient Location: PACU  Anesthesia Type: General  Level of Consciousness: awake, sedated, patient cooperative and responds to stimulation  Airway & Oxygen Therapy: Patient Spontanous Breathing and Patient connected to [place] 02   Post-op Assessment: Report given to PACU RN, Post -op Vital signs reviewed and stable and Patient moving all extremities  Post vital signs: Reviewed and stable  Complications: No apparent anesthesia complications

## 2022-02-06 NOTE — Anesthesia Procedure Notes (Signed)
Anesthesia Regional Block: TAP block   Pre-Anesthetic Checklist: , timeout performed,  Correct Patient, Correct Site, Correct Laterality,  Correct Procedure, Correct Position, site marked,  Risks and benefits discussed,  Surgical consent,  Pre-op evaluation,  At surgeon's request and post-op pain management  Laterality: Right  Prep: chloraprep       Needles:  Injection technique: Single-shot  Needle Type: Echogenic Stimulator Needle     Needle Length: 10cm  Needle Gauge: 20     Additional Needles:   Procedures:,,,, ultrasound used (permanent image in chart),,    Narrative:  Start time: 02/06/2022 1:05 PM End time: 02/06/2022 1:12 PM Injection made incrementally with aspirations every 5 mL.  Performed by: Personally  Anesthesiologist: Lidia Collum, MD  Additional Notes: Standard monitors applied. Skin prepped. Good needle visualization with ultrasound. Injection made in 5cc increments with no resistance to injection. Patient tolerated the procedure well.

## 2022-02-06 NOTE — Anesthesia Postprocedure Evaluation (Signed)
Anesthesia Post Note  Patient: Sophia Cubero  Procedure(s) Performed: OPEN RIGHT INGUINAL HERNIA REPAIR WITH MESH (Right: Inguinal)     Patient location during evaluation: PACU Anesthesia Type: General Level of consciousness: awake and alert Pain management: pain level controlled Vital Signs Assessment: post-procedure vital signs reviewed and stable Respiratory status: spontaneous breathing, nonlabored ventilation and respiratory function stable Cardiovascular status: blood pressure returned to baseline and stable Postop Assessment: no apparent nausea or vomiting Anesthetic complications: no   No notable events documented.  Last Vitals:  Vitals:   02/06/22 1240  BP: (!) 161/103  Pulse: 88  Temp: 36.8 C  SpO2: 97%    Last Pain:  Vitals:   02/06/22 1240  TempSrc: Oral  PainSc: 7                  Lucretia Kern

## 2022-02-06 NOTE — Anesthesia Preprocedure Evaluation (Signed)
Anesthesia Evaluation  Patient identified by MRN, date of birth, ID band Patient awake    Reviewed: Allergy & Precautions, NPO status , Patient's Chart, lab work & pertinent test results  History of Anesthesia Complications Negative for: history of anesthetic complications  Airway Mallampati: II  TM Distance: >3 FB Neck ROM: Full    Dental  (+) Dental Advisory Given   Pulmonary asthma , Current Smoker,    Pulmonary exam normal        Cardiovascular hypertension, Pt. on medications Normal cardiovascular exam     Neuro/Psych Anxiety Depression Bipolar Disorder Schizophrenia negative neurological ROS     GI/Hepatic negative GI ROS, (+) Cirrhosis     substance abuse  alcohol use, cocaine use and marijuana use,   Endo/Other  negative endocrine ROS  Renal/GU negative Renal ROS  negative genitourinary   Musculoskeletal negative musculoskeletal ROS (+)   Abdominal   Peds  Hematology negative hematology ROS (+)   Anesthesia Other Findings   Reproductive/Obstetrics                             Anesthesia Physical Anesthesia Plan  ASA: 2  Anesthesia Plan: General   Post-op Pain Management: Regional block* and Toradol IV (intra-op)*   Induction: Intravenous  PONV Risk Score and Plan: 1 and Ondansetron, Dexamethasone, Midazolam and Treatment may vary due to age or medical condition  Airway Management Planned: LMA  Additional Equipment: None  Intra-op Plan:   Post-operative Plan: Extubation in OR  Informed Consent: I have reviewed the patients History and Physical, chart, labs and discussed the procedure including the risks, benefits and alternatives for the proposed anesthesia with the patient or authorized representative who has indicated his/her understanding and acceptance.     Dental advisory given  Plan Discussed with:   Anesthesia Plan Comments:         Anesthesia  Quick Evaluation

## 2022-02-06 NOTE — Anesthesia Procedure Notes (Signed)
Procedure Name: LMA Insertion Date/Time: 02/06/2022 2:20 PM Performed by: Jessica Priest, CRNA Pre-anesthesia Checklist: Patient identified, Emergency Drugs available, Suction available, Patient being monitored and Timeout performed Patient Re-evaluated:Patient Re-evaluated prior to induction Oxygen Delivery Method: Circle system utilized Preoxygenation: Pre-oxygenation with 100% oxygen Induction Type: IV induction Ventilation: Mask ventilation without difficulty LMA: LMA inserted LMA Size: 4.0 Number of attempts: 1 Airway Equipment and Method: Bite block Placement Confirmation: positive ETCO2, CO2 detector and breath sounds checked- equal and bilateral Tube secured with: Tape Dental Injury: Teeth and Oropharynx as per pre-operative assessment

## 2022-02-06 NOTE — H&P (Signed)
Chief Complaint: Inguinal Hernia   History of Present Illness: Paul Blair is a 56 y.o. male who is seen today as an office consultation at the request of Dr. Delford Field for evaluation of Inguinal Hernia .   He first noticed the hernia 3 months ago. Symptoms are pain and burning. Pain is worse with movement but sometimes hurts with rest or sitting. He has urinary symptoms. He has pain in his hip. He denies nausea or vomiting or bowel habit change.  He does smoke He does not have diabetes He has no history of hernias  Review of Systems: A complete review of systems was obtained from the patient. I have reviewed this information and discussed as appropriate with the patient. See HPI as well for other ROS.  Review of Systems  Constitutional: Negative.  HENT: Negative.  Eyes: Negative.  Respiratory: Negative.  Cardiovascular: Negative.  Gastrointestinal: Positive for abdominal pain.  Genitourinary: Negative.  Musculoskeletal: Negative.  Skin: Negative.  Neurological: Negative.  Endo/Heme/Allergies: Negative.  Psychiatric/Behavioral: Negative.   Medical History: Past Medical History:  Diagnosis Date   Anxiety   Arthritis   Asthma, unspecified asthma severity, unspecified whether complicated, unspecified whether persistent   BPH (benign prostatic hyperplasia)   GERD (gastroesophageal reflux disease)   Radiculopathy   Patient Active Problem List  Diagnosis   Homeless   COVID-19 virus detected   No past surgical history on file.  No Known Allergies Current Outpatient Medications on File Prior to Visit  Medication Sig Dispense Refill   ARIPiprazole (ABILIFY) 5 MG tablet Take 5 mg by mouth once daily   budesonide-formoteroL (SYMBICORT) 160-4.5 mcg/actuation inhaler Inhale into the lungs   cetirizine (ZYRTEC) 10 mg capsule Take by mouth   cyanocobalamin (VITAMIN B12) 1000 MCG tablet Take by mouth   cyclobenzaprine (FLEXERIL) 10 MG tablet Take by mouth   gabapentin (NEURONTIN)  600 MG tablet Take by mouth   hydrOXYzine (ATARAX) 50 MG tablet TAKE 1 TABLET(50 MG) BY MOUTH EVERY 8 HOURS AS NEEDED FOR ANXIETY   losartan-hydroCHLOROthiazide (HYZAAR) 100-25 mg tablet Take 1 tablet by mouth once daily   sildenafiL (VIAGRA) 100 MG tablet TAKE 1/2 TO 1 (ONE-HALF TO ONE) TABLET BY MOUTH ONCE DAILY AS NEEDED FOR ERECTILE DYSFUNCTION   traMADoL (ULTRAM) 50 mg tablet Take by mouth   albuterol 90 mcg/actuation inhaler Inhale 2 inhalations into the lungs every 4 (four) hours as needed for Wheezing for up to 30 days 1 Inhaler 0   amLODIPine (NORVASC) 10 MG tablet Take 1 tablet (10 mg total) by mouth once daily (Patient not taking: Reported on 11/05/2019 ) 30 tablet 0   amLODIPine (NORVASC) 10 MG tablet Take 1 tablet (10 mg total) by mouth once daily for 90 days 90 tablet 0   diclofenac (VOLTAREN) 75 MG EC tablet Take 75 mg by mouth 2 (two) times daily   DULoxetine (CYMBALTA) 60 MG DR capsule   finasteride (PROSCAR) 5 mg tablet Take 5 mg by mouth once daily   fluticasone propionate (FLONASE) 50 mcg/actuation nasal spray   losartan (COZAAR) 50 MG tablet Take 1 tablet (50 mg total) by mouth once daily (Patient not taking: Reported on 10/11/2019 ) 30 tablet 0   pantoprazole (PROTONIX) 40 MG DR tablet   predniSONE (DELTASONE) 10 MG tablet Take 6 tabs today, then decrease by one tab daily - 5 tabs on day 2, 4 tabs on day 3, 3 tabs on day 4, 2 tabs on day 5, 1 tab on day 6. (Patient not taking:  Reported on 10/11/2019 ) 21 tablet 0   tamsulosin (FLOMAX) 0.4 mg capsule Take 1 capsule (0.4 mg total) by mouth once daily Take 30 minutes after same meal each day. 30 capsule 0   tamsulosin (FLOMAX) 0.4 mg capsule Take 1 capsule (0.4 mg total) by mouth once daily for 90 days Take 30 minutes after same meal each day. 30 capsule 0   terazosin (HYTRIN) 1 MG capsule Take 1 capsule (1 mg total) by mouth nightly for 14 days 14 capsule 0   traZODone (DESYREL) 100 MG tablet Take by mouth at bedtime   No current  facility-administered medications on file prior to visit.   No family history on file.  Social History   Tobacco Use  Smoking Status Every Day   Packs/day: 0.50   Types: Cigarettes  Smokeless Tobacco Never   Social History   Socioeconomic History   Marital status: Legally Separated  Tobacco Use   Smoking status: Every Day  Packs/day: 0.50  Types: Cigarettes   Smokeless tobacco: Never  Vaping Use   Vaping Use: Never used  Substance and Sexual Activity   Alcohol use: Yes   Drug use: Never   Objective:   Vitals:  01/23/22 1509  BP: 118/78  Pulse: 103  Temp: 36.9 C (98.4 F)  SpO2: 98%  Weight: (!) 108.2 kg (238 lb 9.6 oz)  Height: 185.4 cm (6\' 1" )   Body mass index is 31.48 kg/m.  Physical Exam Constitutional:  Appearance: Normal appearance.  HENT:  Head: Normocephalic and atraumatic.  Pulmonary:  Effort: Pulmonary effort is normal.  Abdominal:  Comments: Large right inguinal hernia, unable to fully reduce  Musculoskeletal:  General: Normal range of motion.  Cervical back: Normal range of motion.  Neurological:  General: No focal deficit present.  Mental Status: He is alert and oriented to person, place, and time. Mental status is at baseline.  Psychiatric:  Mood and Affect: Mood normal.  Behavior: Behavior normal.  Thought Content: Thought content normal.     Labs, Imaging and Diagnostic Testing: I reviewed notes by  Assessment and Plan:  Diagnoses and all orders for this visit:  Non-recurrent unilateral inguinal hernia without obstruction or gangrene    We discussed etiology of hernias and how they can cause pain. We discussed options for inguinal hernia repair vs observation. We discussed details of the surgery of general anesthesia, surgical approach and incisions, dissecting the sack away from vas deference, testicular vessels and nerves and placement of mesh. We discussed risks of bleeding, infection, recurrence, injury to vas  deference, testicular vessels, nerve injury, and chronic pain. He showed good understanding and wanted proceed with open right inguinal hernia repair as outpatient.

## 2022-02-06 NOTE — Discharge Instructions (Addendum)
[facility] _____[facility]  UMBILICAL OR INGUINAL HERNIA REPAIR: POST OP INSTRUCTIONS  Always review your discharge instruction sheet given to you by the facility where your surgery was performed. IF YOU HAVE DISABILITY OR FAMILY LEAVE FORMS, YOU MUST BRING THEM TO THE OFFICE FOR PROCESSING.   DO NOT GIVE THEM TO YOUR DOCTOR.  1. A  prescription for pain medication may be given to you upon discharge.  Take your pain medication as prescribed, if needed.  If narcotic pain medicine is not needed, then you may take acetaminophen (Tylenol) or ibuprofen (Advil) as needed. 2. Take your usually prescribed medications unless otherwise directed. If you need a refill on your pain medication, please contact your pharmacy.  They will contact our office to request authorization. Prescriptions will not be filled after 5 pm or on week-ends. 3. You should follow a light diet the first 24 hours after arrival home, such as soup and crackers, etc.  Be sure to include lots of fluids daily.  Resume your normal diet the day after surgery. 4.Most patients will experience some swelling and bruising around the umbilicus or in the groin and scrotum.  Ice packs and reclining will help.  Swelling and bruising can take several days to resolve.  6. It is common to experience some constipation if taking pain medication after surgery.  Increasing fluid intake and taking a stool softener (such as Colace) will usually help or prevent this problem from occurring.  A mild laxative (Milk of Magnesia or Miralax) should be taken according to package directions if there are no bowel movements after 48 hours. 7. Unless discharge instructions indicate otherwise, you may remove your bandages 24-48 hours after surgery, and you may shower at that time.  You may have steri-strips (small skin tapes) in place directly over the incision.  These strips should be left on the skin for 7-10 days.  If your surgeon used skin glue on the  incision, you may shower in 24 hours.  The glue will flake off over the next 2-3 weeks.  Any sutures or staples will be removed at the office during your follow-up visit. 8. ACTIVITIES:  You may resume regular (light) daily activities beginning the next day--such as daily self-care, walking, climbing stairs--gradually increasing activities as tolerated.  You may have sexual intercourse when it is comfortable.  Refrain from any heavy lifting or straining until approved by your doctor.  a.You may drive when you are no longer taking prescription pain medication, you can comfortably wear a seatbelt, and you can safely maneuver your car and apply brakes. b.RETURN TO WORK:   _____________________________________________  9.You should see your doctor in the office for a follow-up appointment approximately 2-3 weeks after your surgery.  Make sure that you call for this appointment within a day or two after you arrive home to insure a convenient appointment time. 10.OTHER INSTRUCTIONS: _________________________    _____________________________________  WHEN TO CALL YOUR DOCTOR: Fever over 101.0 Inability to urinate Nausea and/or vomiting Extreme swelling or bruising Continued bleeding from incision. Increased pain, redness, or drainage from the incision  The clinic staff is available to answer your questions during regular business hours.  Please don't hesitate to call and ask to speak to one of the nurses for clinical concerns.  If you have a medical emergency, go to the nearest emergency room or call 911.  A surgeon from [facility] is always on call at the hospital   [address], [address],   [place], [place]  [address] ?  [address] [address], [place], [place]   [address] [phone] ? [phone] ? FAX [phone] Web site: [email or web address]  Post Anesthesia Home Care Instructions  Activity: Get plenty of rest for the remainder of the day. A responsible adult  should stay with you for 24 hours following the procedure.  For the next 24 hours, DO NOT: -Drive a car -Advertising copywriter -Drink alcoholic beverages -Take any medication unless instructed by your physician -Make any legal decisions or sign important papers.  Meals: Start with liquid foods such as gelatin or soup. Progress to regular foods as tolerated. Avoid greasy, spicy, heavy foods. If nausea and/or vomiting occur, drink only clear liquids until the nausea and/or vomiting subsides. Call your physician if vomiting continues.  Special Instructions/Symptoms: Your throat may feel dry or sore from the anesthesia or the breathing tube placed in your throat during surgery. If this causes discomfort, gargle with warm salt water. The discomfort should disappear within 24 hours.  If you had a scopolamine patch placed behind your ear for the management of post- operative nausea and/or vomiting:  1. The medication in the patch is effective for 72 hours, after which it should be removed.  Wrap patch in a tissue and discard in the trash. Wash hands thoroughly with soap and water. 2. You may remove the patch earlier than 72 hours if you experience unpleasant side effects which may include dry mouth, dizziness or visual disturbances. 3. Avoid touching the patch. Wash your hands with soap and water after contact with the patch.

## 2022-02-06 NOTE — Progress Notes (Signed)
Assisted Dr. Christella Hartigan with right, transabdominal plane, ultrasound guided block. Side rails up, monitors on throughout procedure. See vital signs in flow sheet. Tolerated Procedure well.

## 2022-02-07 ENCOUNTER — Encounter (HOSPITAL_BASED_OUTPATIENT_CLINIC_OR_DEPARTMENT_OTHER): Payer: Self-pay | Admitting: General Surgery

## 2022-02-13 ENCOUNTER — Other Ambulatory Visit: Payer: Self-pay | Admitting: Critical Care Medicine

## 2022-02-13 DIAGNOSIS — G8929 Other chronic pain: Secondary | ICD-10-CM

## 2022-03-09 NOTE — Progress Notes (Deleted)
   Established Patient Office Visit  Subjective   Patient ID: Paul Blair, male    DOB: 1965-10-02  Age: 56 y.o. MRN: [id number]  History of Present Illness: 5/17 This patient has had difficulty with progressive inguinal hernia enlargement and now no longer can have a bowel movement and cannot void.  He is maxed out on his dosage of tamsulosin and Proscar for pre-existing prostatic hypertrophy  He went to the ER several days ago and they were not able to offer any assistance he then called his surgeon on the 15th and he is scheduled for May 25 the earliest he can have his hernia repaired.  The surgeon asked for [place] to intervene on his urination issue may be have a catheter placed.  We do not have that ability to do this in our clinic.  I told the patient he would need to go to the emergency room to have a catheter placed and may need to have his surgery moved up even further  I then called the emergency room at [facility] and spoke to the charge nurse named Leonette Most and he knows Mr. Fleites is on his way to the emergency room     6/26  hcv fecal occ S/p Ing hernia repair    Objective:    This is a phone note no physical exam There were no vitals taken for this visit.   Physical Exam   No results found for any visits on 03/10/22.    The 10-year ASCVD risk score (Arnett DK, et al., 2019) is: 31.2%    Assessment & Plan:   Problem List Items Addressed This Visit   None  Follow Up Instructions: The patient is now going to the emergency room he is understanding my advice   I discussed the assessment and treatment plan with the patient. The patient was provided an opportunity to ask questions and all were answered. The patient agreed with the plan and demonstrated an understanding of the instructions.   The patient was advised to call back or seek an in-person evaluation if the symptoms worsen or if the condition fails to improve as anticipated.  I provided 10 minutes of  non-face-to-face time during this encounter  including  median intraservice time , review of notes, labs, imaging, medications  and explaining diagnosis and management to the patient .    Shan Levans, MD

## 2022-03-10 ENCOUNTER — Ambulatory Visit: Payer: Medicare Other | Admitting: Critical Care Medicine

## 2022-04-02 ENCOUNTER — Other Ambulatory Visit: Payer: Self-pay | Admitting: Critical Care Medicine

## 2022-04-02 DIAGNOSIS — G8929 Other chronic pain: Secondary | ICD-10-CM

## 2022-04-05 ENCOUNTER — Other Ambulatory Visit: Payer: Self-pay | Admitting: Critical Care Medicine

## 2022-04-07 NOTE — Telephone Encounter (Signed)
Requested Prescriptions  Pending Prescriptions Disp Refills  . amLODipine (NORVASC) 10 MG tablet [Pharmacy Med Name: AMLODIPINE BESYLATE 10MG  TABLETS] 90 tablet 0    Sig: TAKE 1 TABLET(10 MG) BY MOUTH DAILY     Cardiovascular: Calcium Channel Blockers 2 Failed - 04/05/2022 10:36 AM      Failed - Last BP in normal range    BP Readings from Last 1 Encounters:  02/06/22 (!) 166/98         Passed - Last Heart Rate in normal range    Pulse Readings from Last 1 Encounters:  02/06/22 69         Passed - Valid encounter within last 6 months    Recent Outpatient Visits          2 months ago Right inguinal hernia   [facility] 02/08/22, MD   2 months ago Chronic right-sided low back pain with right-sided sciatica   [facility], NP   9 months ago No-show for appointment   [facility], MD   10 months ago Chronic right-sided low back pain with right-sided sciatica   [facility], MD   12 months ago Essential hypertension   [facility], MD             . pantoprazole (PROTONIX) 40 MG tablet [Pharmacy Med Name: PANTOPRAZOLE 40MG  TABLETS] 90 tablet 0    Sig: TAKE 1 TABLET(40 MG) BY MOUTH DAILY     Gastroenterology: Proton Pump Inhibitors Passed - 04/05/2022 10:36 AM      Passed - Valid encounter within last 12 months    Recent Outpatient Visits          2 months ago Right inguinal hernia   [facility] 04/07/2022, MD   2 months ago Chronic right-sided low back pain with right-sided sciatica   [facility] Storm Frisk, NP   9 months ago No-show for appointment   [facility] Claiborne Rigg, MD   10 months ago Chronic right-sided low back pain with right-sided  sciatica   [facility] Storm Frisk, MD   12 months ago Essential hypertension   [facility] Storm Frisk, MD

## 2022-06-05 ENCOUNTER — Other Ambulatory Visit: Payer: Self-pay | Admitting: Nurse Practitioner

## 2022-06-05 ENCOUNTER — Ambulatory Visit
Admission: RE | Admit: 2022-06-05 | Discharge: 2022-06-05 | Disposition: A | Discharge status: Home / Self Care | Payer: Medicare Other | Source: Ambulatory Visit | Attending: Nurse Practitioner | Admitting: Nurse Practitioner

## 2022-06-05 DIAGNOSIS — M25561 Pain in right knee: Secondary | ICD-10-CM

## 2022-06-06 ENCOUNTER — Other Ambulatory Visit: Payer: Self-pay

## 2022-06-06 ENCOUNTER — Emergency Department (HOSPITAL_COMMUNITY)
Admission: EM | Admit: 2022-06-06 | Discharge: 2022-06-06 | Disposition: A | Discharge status: Home / Self Care | Payer: Medicare Other | Attending: Emergency Medicine | Admitting: Emergency Medicine

## 2022-06-06 ENCOUNTER — Encounter (HOSPITAL_COMMUNITY): Payer: Self-pay

## 2022-06-06 DIAGNOSIS — Z79899 Other long term (current) drug therapy: Secondary | ICD-10-CM | POA: Insufficient documentation

## 2022-06-06 DIAGNOSIS — M25561 Pain in right knee: Secondary | ICD-10-CM | POA: Insufficient documentation

## 2022-06-06 DIAGNOSIS — I10 Essential (primary) hypertension: Secondary | ICD-10-CM | POA: Diagnosis not present

## 2022-06-06 MED ORDER — PREDNISONE 10 MG PO TABS
ORAL_TABLET | ORAL | 0 refills | Status: AC
  Filled 2022-06-06: qty 42, 12d supply, fill #0

## 2022-06-06 MED ORDER — DEXAMETHASONE SODIUM PHOSPHATE 10 MG/ML IJ SOLN
10.0000 mg | Freq: Once | INTRAMUSCULAR | Status: AC
  Administered 2022-06-06: 10 mg via INTRAMUSCULAR
  Filled 2022-06-06: qty 1

## 2022-06-06 MED ORDER — MELOXICAM 15 MG PO TABS
15.0000 mg | ORAL_TABLET | Freq: Once | ORAL | Status: AC
  Administered 2022-06-06: 15 mg via ORAL
  Filled 2022-06-06 (×2): qty 1

## 2022-06-06 MED ORDER — MELOXICAM 15 MG PO TABS
15.0000 mg | ORAL_TABLET | Freq: Every day | ORAL | 0 refills | Status: AC
  Filled 2022-06-06: qty 30, 30d supply, fill #0

## 2022-06-06 NOTE — ED Provider Notes (Signed)
[facility]-EMERGENCY DEPT Provider Note   CSN: [id number] Arrival date & time: 06/06/22  1019     History  Chief Complaint  Patient presents with   Knee Pain   Back Pain    Paul Blair is a 56 y.o. male.  Patient presents to the hospital complaining of right knee pain.  Patient states that approximately a month ago he twisted and felt like he injured his right knee.  He states that the pain subsided substantially until yesterday.  He began to have sharp knee pain yesterday.  His primary care provider ordered an x-ray which was completed.  He has not had follow-up since that time.  Complains of right knee pain with mild swelling.  Denies falls.  Does endorse history of sciatica but states that he is not worried about that today.  Past medical history significant for chronic right-sided low back pain with right-sided sciatica, chronic foot pain on the right side, hypertension, homelessness, substance abuse history, arthritis, depression, history of suicidal ideations  HPI     Home Medications Prior to Admission medications   Medication Sig Start Date End Date Taking? Authorizing Provider  meloxicam (MOBIC) 15 MG tablet Take 1 tablet (15 mg total) by mouth daily. 06/06/22 07/06/22 Yes Zakari Bathe, Dorisann Frames, PA-C  predniSONE (DELTASONE) 10 MG tablet Take 6 tablets (60 mg total) by mouth daily for 2 days, THEN 5 tablets (50 mg total) daily for 2 days, THEN 4 tablets (40 mg total) daily for 2 days, THEN 3 tablets (30 mg total) daily for 2 days, THEN 2 tablets (20 mg total) daily for 2 days, THEN 1 tablet (10 mg total) daily for 2 days. 06/06/22 06/18/22 Yes Barrie Dunker B, PA-C  albuterol (VENTOLIN HFA) 108 (90 Base) MCG/ACT inhaler INHALE 2 PUFFS INTO THE LUNGS EVERY 6 HOURS AS NEEDED FOR WHEEZING OR SHORTNESS OF BREATH 04/02/22   Storm Frisk, MD  amLODipine (NORVASC) 10 MG tablet TAKE 1 TABLET(10 MG) BY MOUTH DAILY 04/07/22   Storm Frisk, MD  ARIPiprazole (ABILIFY)  20 MG tablet Take 20 mg by mouth daily. 01/29/22   [provider]  budesonide-formoterol (SYMBICORT) 160-4.5 MCG/ACT inhaler Inhale 2 puffs into the lungs in the morning and at bedtime. 04/15/21 04/15/22  Storm Frisk, MD  Cetirizine HCl 10 MG CAPS Take by mouth. 11/21/21   [provider]  cyclobenzaprine (FLEXERIL) 10 MG tablet Take 1 tablet (10 mg total) by mouth 3 (three) times daily as needed for muscle spasms. 01/14/22   Claiborne Rigg, NP  diclofenac (VOLTAREN) 75 MG EC tablet TAKE 1 TABLET(75 MG) BY MOUTH TWICE DAILY 08/02/21   Storm Frisk, MD  DULoxetine (CYMBALTA) 60 MG capsule Take 1 capsule (60 mg total) by mouth daily. For depression 04/15/21   Storm Frisk, MD  finasteride (PROSCAR) 5 MG tablet Take 1 tablet (5 mg total) by mouth daily. 04/15/21   Storm Frisk, MD  fluPHENAZine (PROLIXIN) 1 MG tablet Take 1 tablet by mouth 2 (two) times daily.    [provider]  fluticasone (FLONASE) 50 MCG/ACT nasal spray SHAKE LIQUID AND USE 2 SPRAYS IN EACH NOSTRIL DAILY FOR 14 DAYS 11/21/21   Leath-Warren, Sadie Haber, NP  gabapentin (NEURONTIN) 600 MG tablet Take 2 tablets (1,200 mg total) by mouth 2 (two) times daily. 01/09/22 04/09/22  Storm Frisk, MD  hydrOXYzine (ATARAX/VISTARIL) 50 MG tablet TAKE 1 TABLET(50 MG) BY MOUTH EVERY 8 HOURS AS NEEDED FOR ANXIETY 07/11/21  Storm Frisk, MD  ibuprofen (ADVIL) 800 MG tablet Take 800 mg by mouth 3 (three) times daily. 02/11/22   [provider]  losartan-hydrochlorothiazide (HYZAAR) 100-25 MG tablet Take 1 tablet by mouth daily. 04/15/21 07/14/21  Storm Frisk, MD  omeprazole (PRILOSEC) 40 MG capsule take 1 capsule (40 mg) by oral route once daily before a meal 01/31/21   [provider]  oxyCODONE (OXY IR/ROXICODONE) 5 MG immediate release tablet Take 1 tablet (5 mg total) by mouth every 6 (six) hours as needed for severe pain. 02/06/22   Kinsinger, De Blanch, MD  Oxycodone HCl 10 MG TABS  Take 10 mg by mouth 4 (four) times daily as needed. 02/11/22   [provider]  pantoprazole (PROTONIX) 40 MG tablet TAKE 1 TABLET(40 MG) BY MOUTH DAILY 04/07/22   Storm Frisk, MD  QUEtiapine (SEROQUEL) 100 MG tablet Take 1/2 tablet (50mg  total) by mouth at bedtime for mood control    [provider]  sildenafil (VIAGRA) 100 MG tablet TAKE 1/2 TO 1 (ONE-HALF TO ONE) TABLET BY MOUTH ONCE DAILY AS NEEDED FOR ERECTILE DYSFUNCTION 09/18/21   11/16/21, MD  tamsulosin (FLOMAX) 0.4 MG CAPS capsule Take 2 capsules (0.8 mg total) by mouth daily. 04/15/21   06/15/21, MD  traMADol (ULTRAM) 50 MG tablet TAKE 1 TABLET(50 MG) BY MOUTH EVERY 12 HOURS AS NEEDED FOR SEVERE PAIN 02/05/22   02/07/22, MD  vitamin B-12 (CYANOCOBALAMIN) 1000 MCG tablet Take 1 tablet (1,000 mcg total) by mouth daily. 09/26/20   11/24/20, MD      Allergies    Tomato    Review of Systems   Review of Systems  Musculoskeletal:  Positive for arthralgias and joint swelling.    Physical Exam Updated Vital Signs BP (!) 150/96 (BP Location: Left Arm)   Pulse 83   Temp 98.4 F (36.9 C) (Oral)   Resp 18   Ht 6\' 1"  (1.854 m)   Wt 106.6 kg   SpO2 97%   BMI 31.00 kg/m  Physical Exam Constitutional:      Appearance: He is normal weight.  HENT:     Head: Normocephalic and atraumatic.  Eyes:     Conjunctiva/sclera: Conjunctivae normal.  Cardiovascular:     Rate and Rhythm: Normal rate.  Pulmonary:     Effort: Pulmonary effort is normal. No respiratory distress.  Abdominal:     General: Abdomen is flat.  Musculoskeletal:        General: Swelling and tenderness present. No signs of injury.     Cervical back: Normal range of motion.     Comments: General swelling and tenderness noted to the right knee.  No erythema, warmth to suggest septic arthritis.  Mild to moderate swelling.  Patient unwilling at this time to allow [place], anterior drawer testing.  Skin:    General: Skin  is dry.     Capillary Refill: Capillary refill takes less than 2 seconds.  Neurological:     Mental Status: He is alert.  Psychiatric:        Speech: Speech normal.        Behavior: Behavior normal.     ED Results / Procedures / Treatments   Labs (all labs ordered are listed, but only abnormal results are displayed) Labs Reviewed - No data to display  EKG None  Radiology No results found.  Procedures Procedures    Medications Ordered in ED Medications  dexamethasone (DECADRON) injection  10 mg (10 mg Intramuscular Given 06/06/22 1215)  meloxicam (MOBIC) tablet 15 mg (15 mg Oral Given 06/06/22 1331)    ED Course/ Medical Decision Making/ A&P                           Medical Decision Making Risk Prescription drug management.   Patient presents to the emergency department with a chief complaint of right knee pain.  Gnosis includes but is not limited to fracture, dislocation, osteoarthritis, septic arthritis, DVT, others  I viewed the patient's past medical history.  I was able to find plain films of the right knee which were completed yesterday.  I saw no evidence of a fracture or dislocation in the images.  These images did not appear to be over read by a radiologist.  I see no indication at this time for repeat imaging and there is no indication for labs.  There are no signs of septic arthritis at this time in the right knee.  I do not see the need for an arthrocentesis for evaluation at this time.  Based on the patient's history and physical this seems most likely to be an injury.  I see no signs of septic arthritis at this time.  The patient does not have a fracture or dislocation that I can see.  This could be a meniscal injury or other internal derangement of the knee.  Patient will need outpatient orthopedic follow-up.  Patient provided crutches.  I did order a dexamethasone injection and meloxicam for the patient for inflammation.  Upon reassessment the patient was  feeling the same.  Plan to discharge patient at this time.  Patient instructed on crutches use.  Patient provided prescription for meloxicam and steroid taper pack.  Patient will follow-up with orthopedics       Final Clinical Impression(s) / ED Diagnoses Final diagnoses:  Acute pain of right knee    Rx / DC Orders ED Discharge Orders          Ordered    predniSONE (DELTASONE) 10 MG tablet  Daily        06/06/22 1226    meloxicam (MOBIC) 15 MG tablet  Daily        06/06/22 [address] 06/06/22 1357    Sherwood Gambler, MD 06/09/22 1550

## 2022-06-06 NOTE — Discharge Instructions (Addendum)
You were seen today for right knee pain.  Your images did not appear to show a fracture or dislocation.  Please use the crutches as needed in order to reduce weightbearing on the right knee.  I recommend that you follow-up with orthopedics.  Please contact the provider listed in this paperwork.  I have provided a prescription for an anti-inflammatory, meloxicam and for a steroid Dosepak.  Do not take other NSAID medications while taking the meloxicam.  If this medicine upsets your stomach please discontinue use.

## 2022-06-06 NOTE — ED Triage Notes (Addendum)
Per EMS- Patient reports that he has had right knee pain x 2 weeks and worsening today. Patient was seen yesterday for the same and reports that he had x-rays completed.  Patient added in triage that he has right lower back pain that radiates down the right leg. Patient reports a history of sciatica.

## 2022-06-12 ENCOUNTER — Other Ambulatory Visit: Payer: Self-pay

## 2022-06-20 ENCOUNTER — Other Ambulatory Visit: Payer: Self-pay | Admitting: Critical Care Medicine

## 2022-06-20 NOTE — Telephone Encounter (Signed)
Requested Prescriptions  Pending Prescriptions Disp Refills  . pantoprazole (PROTONIX) 40 MG tablet [Pharmacy Med Name: Pantoprazole Sodium 40 MG Oral Tablet Delayed Release] 60 tablet 0    Sig: Take 1 tablet by mouth once daily     Gastroenterology: Proton Pump Inhibitors Passed - 06/20/2022  2:57 PM      Passed - Valid encounter within last 12 months    Recent Outpatient Visits          4 months ago Right inguinal hernia   [place] Elsie Stain, MD   5 months ago Chronic right-sided low back pain with right-sided sciatica   [place] Gildardo Pounds, NP   11 months ago No-show for appointment   [place] Elsie Stain, MD   1 year ago Chronic right-sided low back pain with right-sided sciatica   [place], Patrick E, MD   1 year ago Essential hypertension   [place] Elsie Stain, MD

## 2022-11-01 IMAGING — MR MR LUMBAR SPINE W/O CM
4 of 5 series · 26 of 48 positions shown · non-contrast
Comparison: Lumbar spine radiographs 07/14/2014

CLINICAL DATA: Low back and right lower extremity pain.

EXAM:
MRI LUMBAR SPINE WITHOUT CONTRAST
TECHNIQUE: Multiplanar, multisequence MR imaging of the lumbar spine was
performed. No intravenous contrast was administered.

[Series 5: T2 · sagittal · 4.0mm · 0.73mm/px · 6 of 15 slices shown (1 of 2)]
[im 1/15]
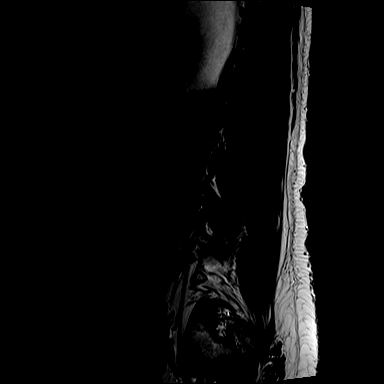
[im 3/15]
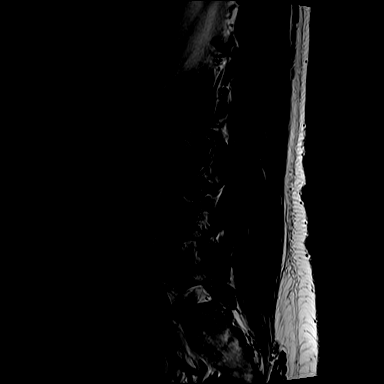
[im 6/15]
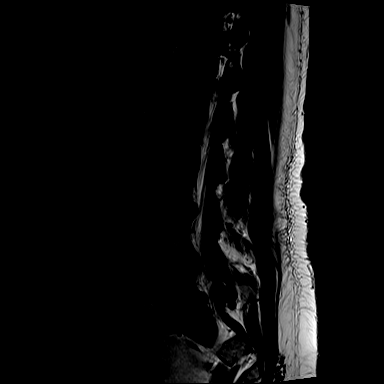
[im 9/15]
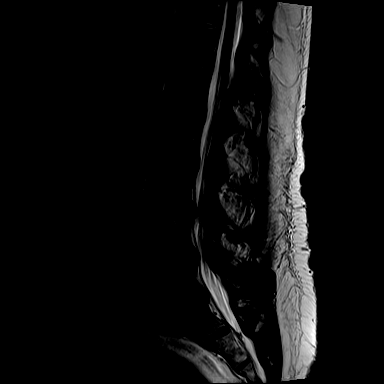
[im 12/15]
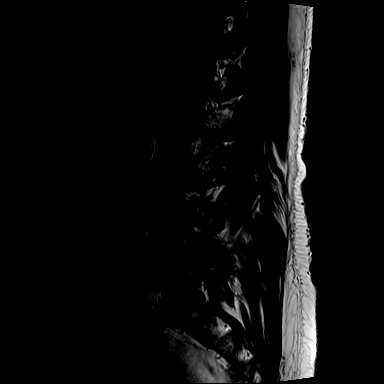
[im 15/15]
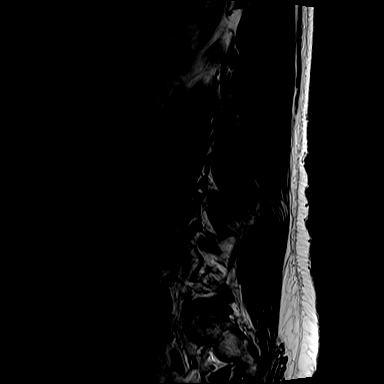

[Series 7: T1 · sagittal · 4.0mm · 0.88mm/px · 7 of 15 slices shown (1 of 2)]
[im 1/15]
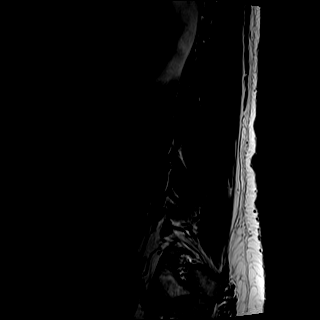
[im 3/15]
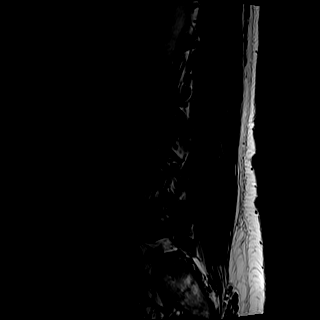
[im 5/15]
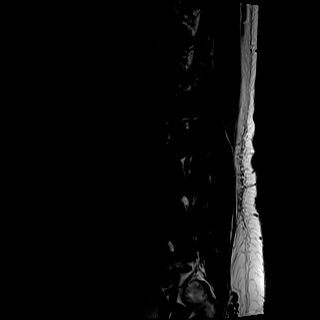
[im 8/15]
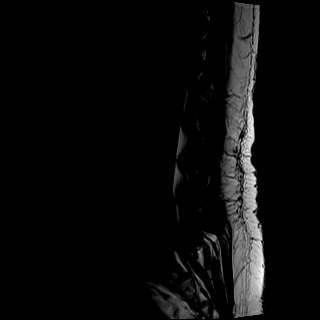
[im 10/15]
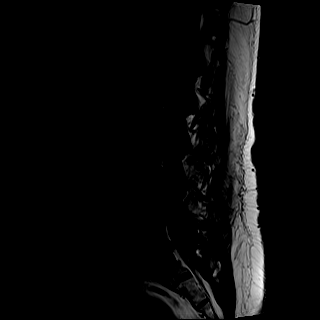
[im 12/15]
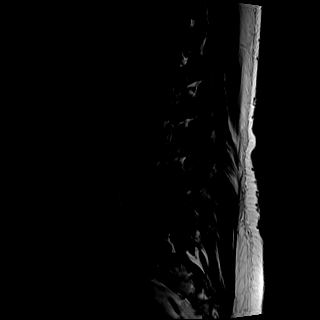
[im 15/15]
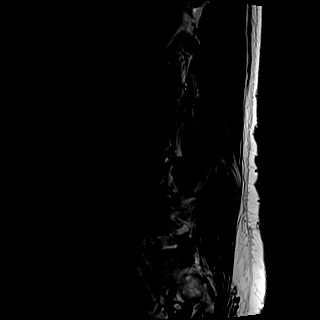

[Series 8: T2 · axial · 4.0mm · 0.57mm/px · z∈[-153,+37]mm · 8 of 32 slices shown (2 of 2)]
[im 1/32]
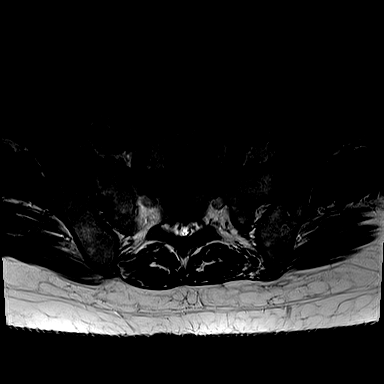
[im 5/32]
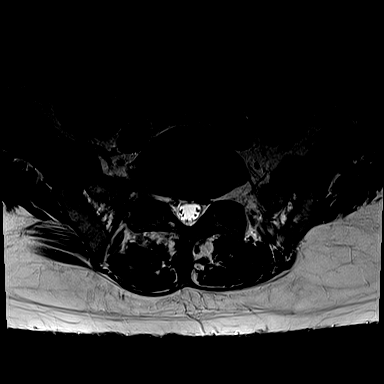
[im 10/32]
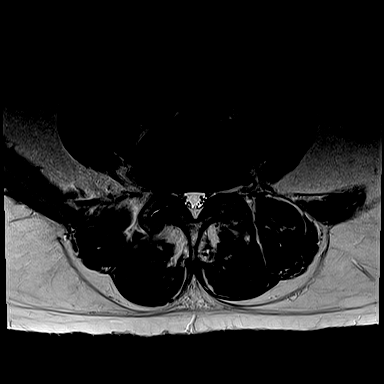
[im 15/32]
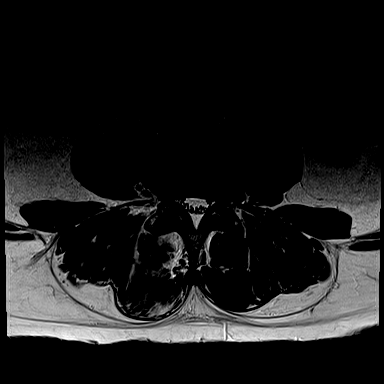
[im 17/32]
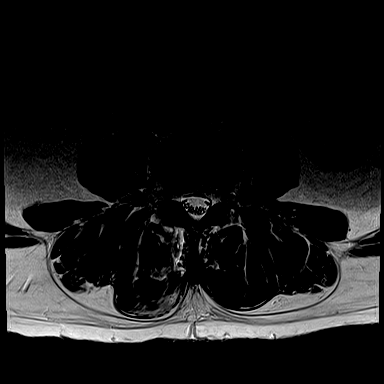
[im 22/32]
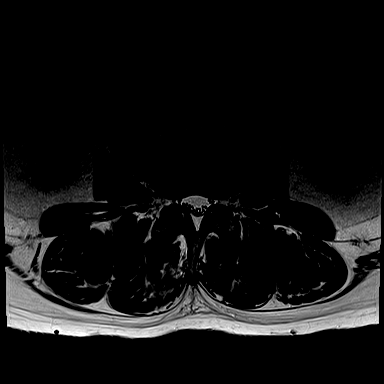
[im 27/32]
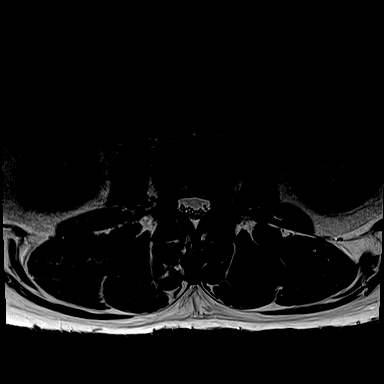
[im 32/32]
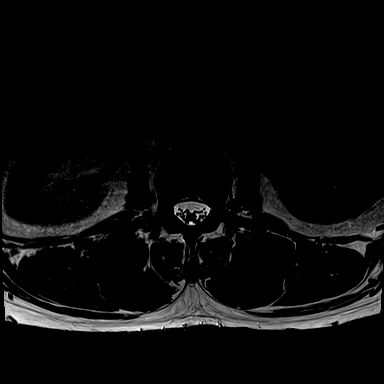

[Series 9: T1 · axial · 4.0mm · 0.34mm/px · z∈[-153,+13]mm · 5 of 32 slices shown (2 of 2)]
[im 1/32]
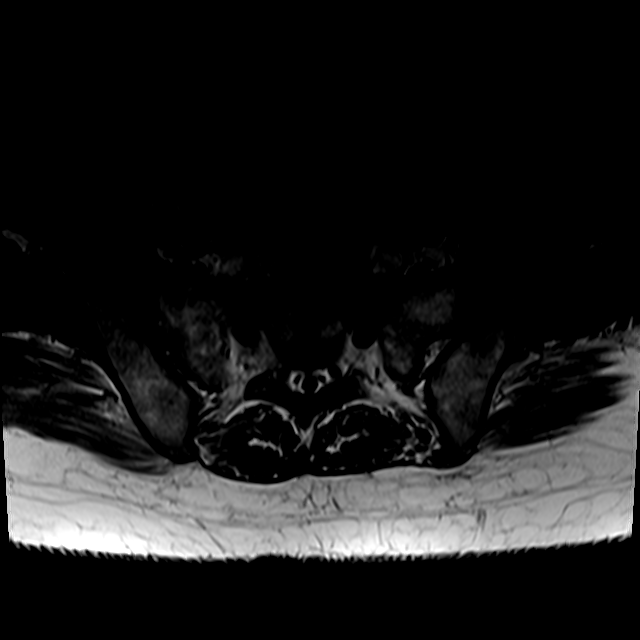
[im 5/32]
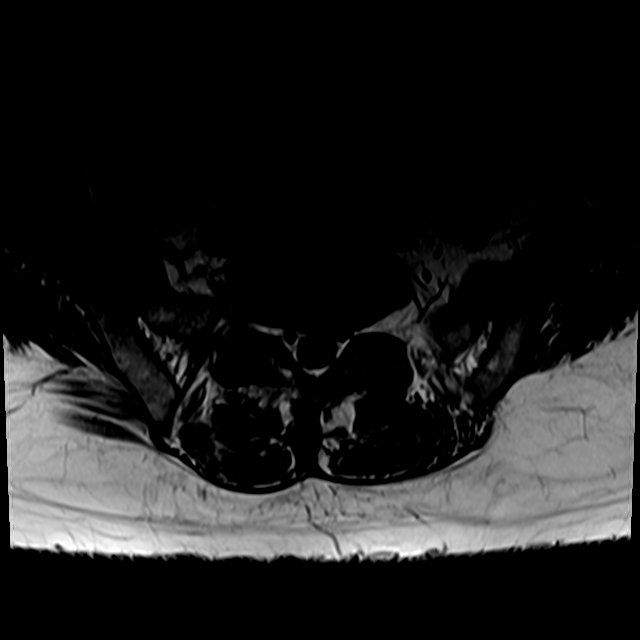
[im 10/32]
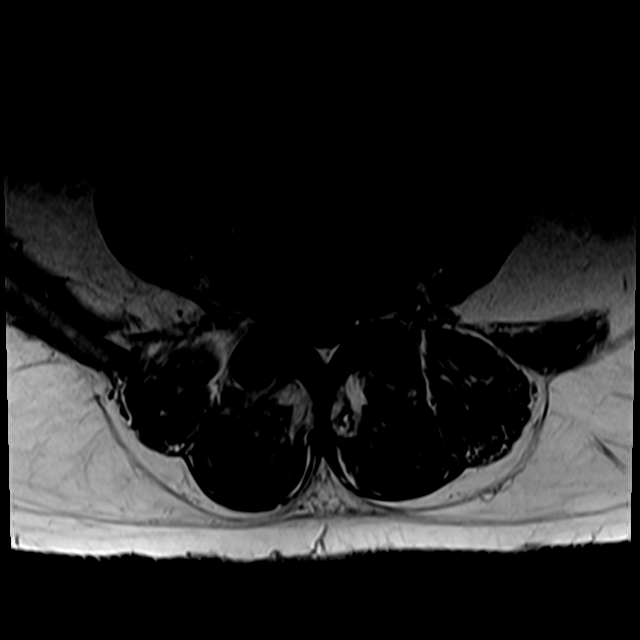
[im 17/32]
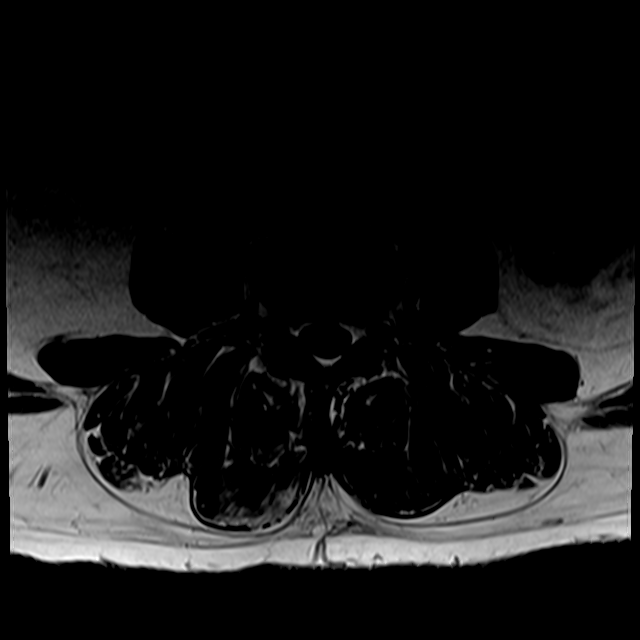
[im 27/32]
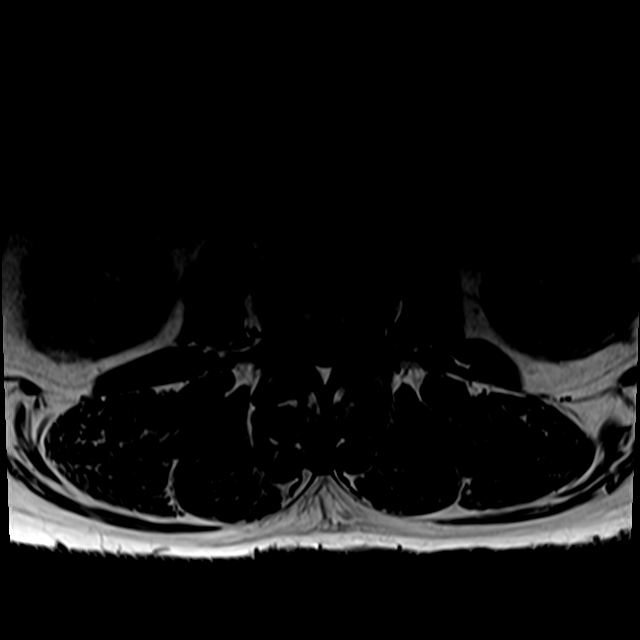

[26 of 48 positions shown; findings below may reference images not displayed]

FINDINGS: Segmentation:  Standard.

Alignment:  Normal.

Vertebrae: No fracture, suspicious marrow lesion, or evidence of
discitis. Extensive marrow edema about the right greater than left
L4-5 facet joints extending into the pedicles.

Conus medullaris and cauda equina: Conus extends to the L1-2 level.
Conus and cauda equina appear normal.

Paraspinal and other soft tissues: Edema in the soft tissues about
the right greater than left L4-5 facet joints. No evidence of a
drainable fluid collection.

Disc levels:

L1-2: Negative.

L2-3: Minimal disc bulging and mild facet hypertrophy without
significant stenosis.

L3-4: Mild disc desiccation and mild disc space narrowing. Disc
bulging, endplate spurring, and mild-to-moderate facet and
ligamentum flavum hypertrophy result in borderline to mild bilateral
lateral recess stenosis and moderate bilateral neural foraminal
stenosis without significant spinal stenosis.

L4-5: Disc bulging, endplate spurring, mild ligamentum flavum
hypertrophy, and moderate to severe facet arthrosis result in
borderline to mild bilateral lateral recess stenosis and moderate
right and mild left neural foraminal stenosis without significant
spinal stenosis.

L5-S1: Mild disc bulging and mild facet hypertrophy without
stenosis.
IMPRESSION: 1. Advanced L4-5 facet arthritis with extensive edema greater on the
right which would likely be a source of back pain. This is favored
to be degenerative rather than indicative of septic facet arthritis
although clinical and laboratory correlation are recommended if
there is clinical concern for infection.
2. Moderate neural foraminal stenosis bilaterally at L3-4 and on the
right at L4-5.
3. No significant spinal stenosis.

## 2023-01-08 ENCOUNTER — Ambulatory Visit (AMBULATORY_SURGERY_CENTER): Payer: 59

## 2023-01-08 VITALS — Ht 73.0 in | Wt 228.0 lb

## 2023-01-08 DIAGNOSIS — Z1211 Encounter for screening for malignant neoplasm of colon: Secondary | ICD-10-CM

## 2023-01-08 MED ORDER — NA SULFATE-K SULFATE-MG SULF 17.5-3.13-1.6 GM/177ML PO SOLN: 1.0000 | Freq: Once | ORAL | 0 refills | Status: AC

## 2023-01-08 NOTE — Progress Notes (Signed)

## 2023-01-09 ENCOUNTER — Telehealth: Payer: Self-pay | Admitting: Gastroenterology

## 2023-01-09 NOTE — Telephone Encounter (Signed)
PT rescheduled colonoscopy and needs to have updated instructions printed and left at front desk for pickup

## 2023-01-09 NOTE — Telephone Encounter (Signed)
Instructions update at front desk for pick up

## 2023-01-13 ENCOUNTER — Encounter: Payer: 59 | Admitting: Gastroenterology

## 2023-01-18 ENCOUNTER — Encounter: Payer: Self-pay | Admitting: Certified Registered Nurse Anesthetist

## 2023-01-21 ENCOUNTER — Telehealth: Payer: Self-pay

## 2023-01-21 NOTE — Telephone Encounter (Signed)
Patient called into the office today requesting that a letter be faxed to court for him. He is due to be in child support court tomorrow but will be having his procedure so he can't make it to court. I informed patient that we do not send letters ahead of time for procedure appointments. I explained to the patient several times that when he comes in for his appt tomorrow he will receive a written letter stating that he was at our office for a procedure. Pt will bring the information so that the letter can be faxed to the court as well. I did tell patient to call the court to let them know that he will have a letter faxed tomorrow afternoon. Pt verbalized understanding of process and was OK with that.

## 2023-01-22 ENCOUNTER — Encounter: Payer: 59 | Admitting: Gastroenterology

## 2023-01-22 ENCOUNTER — Telehealth: Payer: Self-pay | Admitting: Gastroenterology

## 2023-01-22 NOTE — Telephone Encounter (Signed)
Patient calling to reschedule hi procedure today at 3:30 due to him not picking up the prep medication. Rescheduled for 6/4 at 3:30

## 2023-01-23 ENCOUNTER — Telehealth: Payer: Self-pay | Admitting: Critical Care Medicine

## 2023-01-23 NOTE — Telephone Encounter (Signed)
Contacted Paul Blair to schedule their annual wellness visit. Appointment made for 01/27/2023.  Thank you,  [facility] Support [facility] Direct dial  [phone]

## 2023-01-27 ENCOUNTER — Ambulatory Visit: Payer: 59 | Attending: Critical Care Medicine

## 2023-02-13 ENCOUNTER — Telehealth: Payer: Self-pay | Admitting: Gastroenterology

## 2023-02-13 MED ORDER — NA SULFATE-K SULFATE-MG SULF 17.5-3.13-1.6 GM/177ML PO SOLN
1.0000 | Freq: Once | ORAL | 0 refills | Status: AC
Start: 2023-02-13 — End: 2023-02-13

## 2023-02-13 NOTE — Telephone Encounter (Signed)
Prep sent to [facility] on [address] and patient made aware

## 2023-02-13 NOTE — Telephone Encounter (Signed)
Patient calling states he did not pick up his prep and the pharmacy needs a new order. [facility] on [address]. Please advise

## 2023-02-17 ENCOUNTER — Encounter: Payer: 59 | Admitting: Gastroenterology

## 2023-02-23 ENCOUNTER — Encounter: Payer: Self-pay | Admitting: Gastroenterology

## 2023-02-23 ENCOUNTER — Encounter: Payer: 59 | Admitting: Gastroenterology

## 2023-02-23 ENCOUNTER — Telehealth: Payer: Self-pay | Admitting: Gastroenterology

## 2023-02-23 NOTE — Telephone Encounter (Signed)
Patient called to reschedule colonoscopy for today 6/10 due to mistakenly eating food that he was not suppose to. Has been rescheduled.

## 2023-02-23 NOTE — Telephone Encounter (Signed)
Good Morning Dr. Myrtie Neither should I no show this patient?  He did reschedule.

## 2023-02-23 NOTE — Telephone Encounter (Signed)
Paul Blair  Can you try to reach this patient to see if he will reschedule his colonoscopy he has had several chances and either failed to show or had poor prep  He is given one more chance and then no more attempts to do a colon screen

## 2023-02-23 NOTE — Telephone Encounter (Signed)
Lia,  This is the 3rd short-notice or same day cancellation for a colonoscopy because he has not followed preparation instructions.  This has led to three wasted procedure slots.  If he has already been rescheduled, then that future date is his last chance to have a screening colonoscopy with [place].  If he cancels the next procedure on short notice or does not show, he will not be offered another date.    - HD  ________________  Dr. Rolanda Ascher on your patient  -  - Amada Jupiter, MD    [facility]

## 2023-02-23 NOTE — Telephone Encounter (Signed)
Appointment has been rescheduled and is aware of the date and time. Spoke with [facility] office they voiced they were going to reach out to the patient as a reminder prior to appointment

## 2023-03-12 ENCOUNTER — Ambulatory Visit (AMBULATORY_SURGERY_CENTER): Payer: 59

## 2023-03-12 VITALS — Ht 73.0 in | Wt 227.0 lb

## 2023-03-12 DIAGNOSIS — Z1211 Encounter for screening for malignant neoplasm of colon: Secondary | ICD-10-CM

## 2023-03-12 NOTE — Progress Notes (Signed)
No egg or soy allergy known to patient  No issues known to pt with past sedation with any surgeries or procedures Patient denies ever being told they had issues or difficulty with intubation  No FH of Malignant Hyperthermia Pt is not on diet pills Pt is not on  home 02  Pt is not on blood thinners  Pt denies issues with constipation  No A fib or A flutter Have any cardiac testing pending--no  LOA: Independent  Prep: suprep extra miralax   Patient's chart reviewed by Cathlyn Parsons CNRA prior to previsit and patient appropriate for the [facility].  Previsit completed and red dot placed by patient's name on their procedure day (on provider's schedule).     PV competed with patient. Prep instructions sent via mychart and home address. Prep medication has already been picked up by patient. Pt understands to make sure to re read thru his prep instructions and if he has questions to call the office back.   Pt made aware that he needs to show for his procedure another last minute cancellation would result in him not being able to reschedule.

## 2023-03-25 ENCOUNTER — Encounter: Payer: Self-pay | Admitting: Certified Registered Nurse Anesthetist

## 2023-03-27 ENCOUNTER — Telehealth: Payer: Self-pay | Admitting: *Deleted

## 2023-03-27 ENCOUNTER — Telehealth: Payer: Self-pay | Admitting: Gastroenterology

## 2023-03-27 ENCOUNTER — Encounter: Payer: 59 | Admitting: Gastroenterology

## 2023-03-27 NOTE — Telephone Encounter (Addendum)
Hi Dr Myrtie Neither,  This patient is scheduled for today at 1:30 pm, Patient has not arrived. I will no show for today's appointment.   Thank you

## 2023-03-27 NOTE — Telephone Encounter (Signed)
Paul Blair,  3 [facility] no-shows for this patient  Discharge from [facility]  - H. Danis

## 2023-03-27 NOTE — Telephone Encounter (Signed)
Called pt to see if he was coming to scheduled appointment. No answer. Left message.

## 2023-04-01 NOTE — Telephone Encounter (Signed)
Discharge initiated.  Letter on Dr. Myrtie Neither Desk

## 2023-04-08 ENCOUNTER — Telehealth: Payer: Self-pay | Admitting: Gastroenterology

## 2023-04-08 NOTE — Telephone Encounter (Signed)
Patient has been dismissed from [facility] and all providers practicing at this clinic will no longer be able to provide medical care to you. We believe that our patient / provider relationship has been compromised and thus would request you seek care elsewhere. 04/01/23

## 2023-05-26 ENCOUNTER — Other Ambulatory Visit: Payer: Self-pay | Admitting: Nurse Practitioner

## 2023-05-26 DIAGNOSIS — R972 Elevated prostate specific antigen [PSA]: Secondary | ICD-10-CM

## 2023-10-16 ENCOUNTER — Encounter (HOSPITAL_COMMUNITY): Payer: Self-pay

## 2023-10-16 ENCOUNTER — Other Ambulatory Visit: Payer: Self-pay

## 2023-10-16 ENCOUNTER — Emergency Department (HOSPITAL_COMMUNITY)
Admission: EM | Admit: 2023-10-16 | Discharge: 2023-10-17 | Disposition: A | Discharge status: Home / Self Care | Payer: 59 | Attending: Physician Assistant | Admitting: Physician Assistant

## 2023-10-16 ENCOUNTER — Emergency Department (HOSPITAL_COMMUNITY): Payer: 59

## 2023-10-16 DIAGNOSIS — F17210 Nicotine dependence, cigarettes, uncomplicated: Secondary | ICD-10-CM | POA: Diagnosis not present

## 2023-10-16 DIAGNOSIS — Z8616 Personal history of COVID-19: Secondary | ICD-10-CM | POA: Insufficient documentation

## 2023-10-16 DIAGNOSIS — J45909 Unspecified asthma, uncomplicated: Secondary | ICD-10-CM | POA: Diagnosis not present

## 2023-10-16 DIAGNOSIS — T40604A Poisoning by unspecified narcotics, undetermined, initial encounter: Secondary | ICD-10-CM | POA: Insufficient documentation

## 2023-10-16 DIAGNOSIS — I10 Essential (primary) hypertension: Secondary | ICD-10-CM | POA: Insufficient documentation

## 2023-10-16 LAB — CBC WITH DIFFERENTIAL/PLATELET
Abs Immature Granulocytes: 0.02 10*3/uL (ref 0.00–0.07)
Basophils Absolute: 0 10*3/uL (ref 0.0–0.1)
Basophils Relative: 1 %
Eosinophils Absolute: 0.1 10*3/uL (ref 0.0–0.5)
Eosinophils Relative: 2 %
HCT: 47.8 % (ref 39.0–52.0)
Hemoglobin: 16.5 g/dL (ref 13.0–17.0)
Immature Granulocytes: 0 %
Lymphocytes Relative: 48 %
Lymphs Abs: 2.7 10*3/uL (ref 0.7–4.0)
MCH: 32.4 pg (ref 26.0–34.0)
MCHC: 34.5 g/dL (ref 30.0–36.0)
MCV: 93.9 fL (ref 80.0–100.0)
Monocytes Absolute: 0.3 10*3/uL (ref 0.1–1.0)
Monocytes Relative: 6 %
Neutro Abs: 2.4 10*3/uL (ref 1.7–7.7)
Neutrophils Relative %: 43 %
Platelets: 230 10*3/uL (ref 150–400)
RBC: 5.09 MIL/uL (ref 4.22–5.81)
RDW: 14.1 % (ref 11.5–15.5)
WBC: 5.6 10*3/uL (ref 4.0–10.5)
nRBC: 0 % (ref 0.0–0.2)

## 2023-10-16 LAB — COMPREHENSIVE METABOLIC PANEL
ALT: 19 U/L (ref 0–44)
AST: 18 U/L (ref 15–41)
Albumin: 3.9 g/dL (ref 3.5–5.0)
Alkaline Phosphatase: 50 U/L (ref 38–126)
Anion gap: 11 (ref 5–15)
BUN: 10 mg/dL (ref 6–20)
CO2: 25 mmol/L (ref 22–32)
Calcium: 8.7 mg/dL — ABNORMAL LOW (ref 8.9–10.3)
Chloride: 106 mmol/L (ref 98–111)
Creatinine, Ser: 1.27 mg/dL — ABNORMAL HIGH (ref 0.61–1.24)
GFR, Estimated: >60 mL/min (ref >60)
Glucose, Bld: 86 mg/dL (ref 70–99)
Potassium: 3.1 mmol/L — ABNORMAL LOW (ref 3.5–5.1)
Sodium: 142 mmol/L (ref 135–145)
Total Bilirubin: 0.9 mg/dL (ref 0.0–1.2)
Total Protein: 6.8 g/dL (ref 6.5–8.1)

## 2023-10-16 LAB — ETHANOL: Alcohol, Ethyl (B): 154 mg/dL — ABNORMAL HIGH (ref <10)

## 2023-10-16 NOTE — ED Triage Notes (Signed)
BIB EMS/ pt took unknown opiate/ pt was given a total of 3mg  narcan/ pt on 2L o2/ vitals stable at this time/ pt is drowsy but A&Ox4

## 2023-10-16 NOTE — ED Provider Triage Note (Cosign Needed)
Emergency Medicine Provider Triage Evaluation Note  Paul Blair , a 58 y.o. male  was evaluated in triage.  Pt complains of possible overdose. States he was trying to "get with a lady." The next he knew he was slumped over on the floor. EMS gave narcan with minimal improvement. Intermittently sleepy. No hx of trauma. States he "took something" Not sure what he took.  Review of Systems  Positive: Possible OD Negative:   Physical Exam  Wt 104 kg   SpO2 100% Comment: 2L  BMI 30.25 kg/m  Gen:   Intermittently sleepy, however arousal to voice Resp:  Normal effort, normal respiration MSK:   Moves extremities without difficulty  Other:    Medical Decision Making  Medically screening exam initiated at 10:06 PM.  Appropriate orders placed.  Travon Crochet was informed that the remainder of the evaluation will be completed by another provider, this initial triage assessment does not replace that evaluation, and the importance of remaining in the ED until their evaluation is complete.  Possible OD   Wallace Gappa A, PA-C 10/16/23 2209

## 2023-10-17 MED ORDER — NALOXONE HCL 4 MG/0.1ML NA LIQD
1.0000 | Freq: Once | NASAL | 0 refills | Status: AC
  Filled 2023-10-17: qty 2, 2d supply, fill #0

## 2023-10-17 NOTE — Discharge Instructions (Addendum)
You were evaluated in the Emergency Department and after careful evaluation, we did not find any emergent condition requiring admission or further testing in the hospital.  Your exam/testing today is overall reassuring.  Symptoms suspicious for opioid overdose.  Recommend avoiding opioids and other dangerous drugs, recommend filling the prescription for Narcan.  Follow-up with the resources provided.  You have an abnormal lymph node found on your CT scan next to your ear.  Recommend follow-up with your primary care doctor for further management.  Please return to the Emergency Department if you experience any worsening of your condition.   Thank you for allowing [place] to be a part of your care.

## 2023-10-17 NOTE — ED Provider Notes (Signed)
[facility]PT [facility] Emergency Department Provider Note MRN:  [id number]  Arrival date & time: 10/17/23     Chief Complaint   Drug Overdose   History of Present Illness   Paul Blair is a 58 y.o. year-old male with a history of bipolar disorder, substance use disorder, cirrhosis presenting to the ED with chief complaint of drug overdose.  Brought in by EMS patient took unknown amount of opiates, was drowsy, significant decreased responsiveness.  Was given 3 mg Narcan and had significant improvement, has been without symptoms since then.  Review of Systems  A thorough review of systems was obtained and all systems are negative except as noted in the HPI and PMH.   Patient's Health History    Past Medical History:  Diagnosis Date   Ambulates with cane    only when back pain and sciatica are bad per pt on 02/05/22   Anxiety    Arthritis    pt states "all over" as of 02/05/22   Asthma    Bipolar disorder (HCC)    Chronic right-sided low back pain with sciatica    occasionally uses walker or cane per pt as of 02/05/22   Cocaine abuse (HCC)    hx of, pt states that he no longer uses cocanine as of 02/05/22   COVID 09/2019   COVID-19 virus detected 10/18/2019   Formatting of this note might be different from the original. 09/2019 at community testing event   Depression    Depression with suicidal ideation 11/04/2014   Headache    Pt states daily headaches as of 02/05/22.   History of ETOH abuse    pt states that he only drinks socially as of 02/05/22   Homicidal ideations    Hypertension    Inguinal hernia 2023   right   Liver cirrhosis (HCC)    Prostatic hypertrophy 2023   Schizoaffective disorder (HCC)    hx of   SOB (shortness of breath) 11/05/2019   w/ chest tightness and cough, recently had COVID 3 weeks prior, smokes heavily   Substance induced mood disorder (HCC) 11/06/2014   Suicidal ideations    Wears glasses     Past Surgical History:  Procedure  Laterality Date   APPENDECTOMY     1990's   COLONOSCOPY     INGUINAL HERNIA REPAIR Right 02/06/2022   Procedure: OPEN RIGHT INGUINAL HERNIA REPAIR WITH MESH;  Surgeon: Kinsinger, De Blanch, MD;  Location: [facility];  Service: General;  Laterality: Right;    Family History  Problem Relation Age of Onset   Colon cancer Neg Hx    Colon polyps Neg Hx    Rectal cancer Neg Hx    Stomach cancer Neg Hx     Social History   Socioeconomic History   Marital status: Divorced    Spouse name: Not on file   Number of children: Not on file   Years of education: Not on file   Highest education level: Not on file  Occupational History   Not on file  Tobacco Use   Smoking status: Every Day    Current packs/day: 1.00    Types: Cigarettes   Smokeless tobacco: Never  Vaping Use   Vaping status: Never Used  Substance and Sexual Activity   Alcohol use: Not Currently    Comment: not weekly, just socially per pt on 02/05/22   Drug use: Yes    Types: Cocaine, Marijuana    Comment: sometimes   Sexual  activity: Not on file  Other Topics Concern   Not on file  Social History Narrative   Not on file   Social Drivers of Health   Financial Resource Strain: Not on File (01/02/2022)   Received from [employer], [place]   [employer]    Financial Resource Strain: 0  Food Insecurity: Not on File (06/11/2023)   Received from [facility] Insecurity    Food: 0  Transportation Needs: Not on File (01/02/2022)   Received from [employer], [employer] Needs    Transportation: 0  Physical Activity: Not on File (01/02/2022)   Received from [place], [place]   Physical Activity    Physical Activity: 0  Stress: Not on File (01/02/2022)   Received from [facility], [place]   Stress    Stress: 0  Social Connections: Not on File (06/11/2023)   Received from [facility]   Social Connections    Connectedness: 0  Intimate Partner Violence: Unknown (11/27/2022)   Received from [facility]   HITS    Physically Hurt: Not on file    Insult or Talk Down To: Not on file    Threaten Physical Harm: Not on file    Scream or Curse: Not on file     Physical Exam   Vitals:   10/16/23 2158 10/16/23 2212  BP:  133/77  Pulse:  66  Resp:  20  Temp:  98 F (36.7 C)  SpO2: 100% 97%    CONSTITUTIONAL: Well-appearing, NAD NEURO/PSYCH:  Alert and oriented x 3, no focal deficits EYES:  eyes equal and reactive ENT/NECK:  no LAD, no JVD CARDIO: Regular rate, well-perfused, normal S1 and S2 PULM:  CTAB no wheezing or rhonchi GI/GU:  non-distended, non-tender MSK/SPINE:  No gross deformities, no edema SKIN:  no rash, atraumatic   *Additional and/or pertinent findings included in MDM below  Diagnostic and Interventional Summary    EKG Interpretation Date/Time:    Ventricular Rate:    PR Interval:    QRS Duration:    QT Interval:    QTC Calculation:   R Axis:      Text Interpretation:         Labs Reviewed  COMPREHENSIVE METABOLIC PANEL - Abnormal; Notable for the following components:      Result Value   Potassium 3.1 (*)    Creatinine, Ser 1.27 (*)    Calcium 8.7 (*)    All other components within normal limits  ETHANOL - Abnormal; Notable for the following components:   Alcohol, Ethyl (B) 154 (*)    All other components within normal limits  CBC WITH DIFFERENTIAL/PLATELET  RAPID URINE DRUG SCREEN, HOSP PERFORMED    CT HEAD WO CONTRAST ( )  Final Result    CT Cervical Spine Wo Contrast  Final Result      Medications - No data to display   Procedures  /  Critical Care Procedures  ED Course and Medical Decision Making  Initial Impression and Ddx Opioid overdose, has been observed here in the emergency department for 3+ hours without symptoms, no evidence of any sedation.  On my exam he is sleeping comfortably, wakes easily, fully conversant, alert and oriented, denies any complaints.  Past medical/surgical history that increases complexity of  ED encounter: Devineni Crohn's disorder  Interpretation of Diagnostics I personally reviewed the laboratory assessment and my interpretation is as follows: No significant blood count or electrolyte disturbance  CT imaging without acute pathology, possible pathologic lymph  node noted.  Patient Reassessment and Ultimate Disposition/Management     No indication for further testing or admission, patient is appropriate for discharge.  Patient management required discussion with the following services or consulting groups:  None  Complexity of Problems Addressed Acute illness or injury that poses threat of life of bodily function  Additional Data Reviewed and Analyzed Further history obtained from: None  Additional Factors Impacting ED Encounter Risk Prescriptions and Consideration of hospitalization  Elmer Sow. Pilar Plate, MD [facility] Emergency Medicine [facility] [email or web address]  Final Clinical Impressions(s) / ED Diagnoses     ICD-10-CM   1. Opiate overdose, undetermined intent, initial encounter [facility])  T40.604A       ED Discharge Orders          Ordered    naloxone (NARCAN) nasal spray 4 mg/0.1 mL   Once        10/17/23 0059             Discharge Instructions Discussed with and Provided to Patient:    Discharge Instructions      You were evaluated in the Emergency Department and after careful evaluation, we did not find any emergent condition requiring admission or further testing in the hospital.  Your exam/testing today is overall reassuring.  Symptoms suspicious for opioid overdose.  Recommend avoiding opioids and other dangerous drugs, recommend filling the prescription for Narcan.  Follow-up with the resources provided.  You have an abnormal lymph node found on your CT scan next to your ear.  Recommend follow-up with your primary care doctor for further management.  Please return to the Emergency Department if you experience any worsening  of your condition.   Thank you for allowing [place] to be a part of your care.      Sabas Sous, MD 10/17/23 [phone]

## 2023-10-19 ENCOUNTER — Other Ambulatory Visit: Payer: Self-pay

## 2023-11-22 ENCOUNTER — Other Ambulatory Visit: Payer: 59

## 2024-01-08 ENCOUNTER — Other Ambulatory Visit: Payer: Self-pay | Admitting: Nurse Practitioner

## 2024-01-08 DIAGNOSIS — F1021 Alcohol dependence, in remission: Secondary | ICD-10-CM

## 2024-01-12 ENCOUNTER — Other Ambulatory Visit

## 2024-01-18 ENCOUNTER — Other Ambulatory Visit

## 2024-02-05 ENCOUNTER — Other Ambulatory Visit

## 2024-02-18 ENCOUNTER — Other Ambulatory Visit

## 2024-02-23 ENCOUNTER — Other Ambulatory Visit

## 2024-02-26 ENCOUNTER — Other Ambulatory Visit
